# Patient Record
Sex: Male | Born: 1980 | Race: Black or African American | Hispanic: No | Marital: Single | Smoking: Current some day smoker
Health system: Southern US, Community
[De-identification: ages and names within clinical notes are randomized; demographics above are authoritative.]

## PROBLEM LIST (undated history)

## (undated) DIAGNOSIS — I639 Cerebral infarction, unspecified: Secondary | ICD-10-CM

## (undated) DIAGNOSIS — F419 Anxiety disorder, unspecified: Secondary | ICD-10-CM

## (undated) DIAGNOSIS — F329 Major depressive disorder, single episode, unspecified: Secondary | ICD-10-CM

## (undated) DIAGNOSIS — F259 Schizoaffective disorder, unspecified: Secondary | ICD-10-CM

## (undated) DIAGNOSIS — F32A Depression, unspecified: Secondary | ICD-10-CM

## (undated) HISTORY — DX: Anxiety disorder, unspecified: F41.9

## (undated) HISTORY — DX: Major depressive disorder, single episode, unspecified: F32.9

## (undated) HISTORY — DX: Depression, unspecified: F32.A

## (undated) HISTORY — DX: Cerebral infarction, unspecified: I63.9

---

## 2003-05-06 ENCOUNTER — Inpatient Hospital Stay (HOSPITAL_COMMUNITY): Admission: EM | Admit: 2003-05-06 | Discharge: 2003-05-12 | Payer: Self-pay | Admitting: Psychiatry

## 2004-10-03 ENCOUNTER — Inpatient Hospital Stay (HOSPITAL_COMMUNITY): Admission: EM | Admit: 2004-10-03 | Discharge: 2004-10-07 | Payer: Self-pay | Admitting: Psychiatry

## 2004-10-03 ENCOUNTER — Ambulatory Visit: Payer: Self-pay | Admitting: Psychiatry

## 2005-01-06 ENCOUNTER — Emergency Department (HOSPITAL_COMMUNITY): Admission: EM | Admit: 2005-01-06 | Discharge: 2005-01-06 | Payer: Self-pay | Admitting: Emergency Medicine

## 2005-03-15 ENCOUNTER — Emergency Department (HOSPITAL_COMMUNITY): Admission: EM | Admit: 2005-03-15 | Discharge: 2005-03-16 | Payer: Self-pay | Admitting: Emergency Medicine

## 2005-06-18 ENCOUNTER — Inpatient Hospital Stay (HOSPITAL_COMMUNITY): Admission: AD | Admit: 2005-06-18 | Discharge: 2005-06-26 | Payer: Self-pay | Admitting: Psychiatry

## 2005-06-19 ENCOUNTER — Ambulatory Visit: Payer: Self-pay | Admitting: Psychiatry

## 2007-01-15 ENCOUNTER — Emergency Department (HOSPITAL_COMMUNITY): Admission: EM | Admit: 2007-01-15 | Discharge: 2007-01-15 | Payer: Self-pay | Admitting: Family Medicine

## 2007-02-19 ENCOUNTER — Emergency Department (HOSPITAL_COMMUNITY): Admission: EM | Admit: 2007-02-19 | Discharge: 2007-02-19 | Payer: Self-pay | Admitting: Family Medicine

## 2007-06-14 ENCOUNTER — Emergency Department (HOSPITAL_COMMUNITY): Admission: EM | Admit: 2007-06-14 | Discharge: 2007-06-14 | Payer: Self-pay | Admitting: Emergency Medicine

## 2008-06-03 ENCOUNTER — Emergency Department (HOSPITAL_COMMUNITY): Admission: EM | Admit: 2008-06-03 | Discharge: 2008-06-03 | Payer: Self-pay | Admitting: Family Medicine

## 2008-12-06 ENCOUNTER — Emergency Department (HOSPITAL_COMMUNITY): Admission: EM | Admit: 2008-12-06 | Discharge: 2008-12-06 | Payer: Self-pay | Admitting: Emergency Medicine

## 2009-03-03 ENCOUNTER — Emergency Department (HOSPITAL_COMMUNITY): Admission: EM | Admit: 2009-03-03 | Discharge: 2009-03-03 | Payer: Self-pay | Admitting: Family Medicine

## 2010-08-30 ENCOUNTER — Emergency Department (HOSPITAL_COMMUNITY)
Admission: EM | Admit: 2010-08-30 | Discharge: 2010-08-30 | Payer: Self-pay | Source: Home / Self Care | Admitting: Family Medicine

## 2010-09-05 LAB — WOUND CULTURE: Culture: NO GROWTH

## 2010-11-27 LAB — GLUCOSE, CAPILLARY: Glucose-Capillary: 86 mg/dL (ref 70–99)

## 2011-01-06 NOTE — Discharge Summary (Signed)
NAME:  Steven Clayton, Steven Clayton NO.:  [id number]   MEDICAL RECORD NO.:  [id number]          PATIENT TYPE:  IPS   LOCATION:  0506                          FACILITY:  [facility]   PHYSICIAN:  Geoffery Lyons, M.D.      DATE OF BIRTH:  [birth year]   DATE OF ADMISSION:  10/03/2004  DATE OF DISCHARGE:  10/07/2004                                 DISCHARGE SUMMARY   CHIEF COMPLAINT/HISTORY OF PRESENT ILLNESS:  This is the second admission to  [facility] for this 30 year old single African-  American male voluntarily admitted with suicidal ideas with a plan to cut  his wrists.  He reports that his recent stressor was that his 34 year old  brother was shot in the head last year which he stated was gang related.  Endorsed leader support system, has a very supportive mother, sleeping only  about 6 hours at a time, decreased appetite, 18-pound weight loss, and knows  he has power to heal himself better.  He denies hallucinations, although he  has a history of hallucinations in the past.  He denies any alcohol or  substance abuse.   PAST PSYCHIATRIC HISTORY:  Admitted in September 2004 for auditory  hallucinations and paranoia.  He is seeing Dr. Allyne Gee in [facility]   ALCOHOL OR DRUG HISTORY:  He denies the use or abuse of any substance.  Initially he denied use or abuse of any substances, although reports some  recent marijuana use.   PAST MEDICAL HISTORY:  Noncontributory.   MEDICATIONS:  1.  Depakote 500 at night.  2.  Risperdal 4 mg at night.  He reports compliance.   PHYSICAL EXAMINATION:  Performed with no acute findings.   LABORATORY WORKUP:  CBC, hemoglobin 12.8, hematocrit 37.9.  Blood  chemistries showed glucose 108.  Liver profile was within normal limits.  Depakote level 12.5.  Drug screen was negative for substances of abuse.   MENTAL STATUS EXAM:  Reveals a polite male, cooperative, good eye contact.  Casually dressed.   Speech was clear, normal pace, rate and tone.  Mood was  depressed.  Affect was constricted.  Thought process reports some manical  thinking.  Endorses suicidal thoughts but denied any at the time of this  evaluation, no homicidal idea.  He denies any auditory or visual  hallucinations.  Cognition well preserved.   DISCHARGE DIAGNOSES:   AXIS I:  Schizo-affective disorder.   AXIS II:  No diagnosis.   AXIS III:  No diagnosis.   AXIS IV:  Moderate.   AXIS V:  Upon admission 30, highest in last year 60-65.   COURSE IN HOSPITAL:  He was admitted, started in individual and group  psychotherapy.  He was maintained on Risperdal 4 mg at night and Depakote ER  500 at night.  He was also given Ambien for sleep.  __________ 5 mg per day.  He initially was very guarded as far as what was going on.  He did say that  he was wanting to mend a relationship, and it did not work out.  He felt  very overwhelmed.  He was very upset and said he did not want to discuss it.  He was very guarded about this information, but he denied any active  suicidal or homicidal ideation.  Basically he contained to the room, he  would come out and share some with the other patients and staff, endorsing  that he was better but the February 15, no suicide or homicidal ideas.  Endorsed that he had been able to live with what happened.  If he was able  to deal with that happened it was going to make him feel better.  He  endorsed that he was not ready to do it.  He continued to be compliant with  medications, endorsed he was feeling better.  Endorsed no suicidal or  homicidal ideas, endorsed side effects to medication, so he was stable  enough that we went ahead and discharged to outpatient followup.   DISCHARGE DIAGNOSES:   AXIS I:  Schizo-affective disorder, depressed.   AXIS II:  No diagnosis.   AXIS III:  No diagnosis.   AXIS IV:  Moderate.   AXIS V:  On discharge 55.   DISCHARGE MEDICATIONS:  1.   Risperdal 2 mg 2 at night.  2.  Depakote ER 500 one at night.  3.  Lexapro 10 mg per day.   FOLLOW UP:  [facility].      IL/MEDQ  D:  11/08/2004  T:  11/08/2004  Job:  [id number]

## 2011-01-06 NOTE — Discharge Summary (Signed)
NAME:  Steven Clayton, Steven Clayton                      ACCOUNT NO.:  [id number]   MEDICAL RECORD NO.:  [id number]                   PATIENT TYPE:  IPS   LOCATION:  0406                                 FACILITY:  [facility]   PHYSICIAN:  Geoffery Lyons, M.D.                   DATE OF BIRTH:  06-25-1981   DATE OF ADMISSION:  05/06/2003  DATE OF DISCHARGE:  05/12/2003                                 DISCHARGE SUMMARY   CHIEF COMPLAINT AND PRESENT ILLNESS:  This was the first admission to [facility] for this 30 year old single African-American male  involuntarily committed.  Apparently, he was hostile, aggressive, stating he  was going to kill his mother, having auditory hallucinations, hearing  spirits.  The patient reported that he did hear voices and he felt that his  mother was trying to get him hurt.  There was apparently information that he  said that he was telepathic.  He denied any visual hallucinations.  York Spaniel he  was upset because the staff was asking too many questions about his personal  life.  Did not like the medication he was taking, especially the Depakote,  because the pill was too big.   PAST PSYCHIATRIC HISTORY:  First time at [facility].  Was  hospitalized at [facility].  Followed with the [facility]  Team.   ALCOHOL/DRUG HISTORY:  Smokes.  Denies any other substance use, although  drug screen was positive for marijuana.   PAST MEDICAL HISTORY:  Noncontributory.   MEDICATIONS:  Abilify 15 mg at bedtime, Depakote ER 500 mg at night,  Risperdal 4 mg at night but there is probably lack of compliance.   PHYSICAL EXAMINATION:  Performed and failed to show any acute findings.   MENTAL STATUS EXAM:  Alert, young male.  Cooperative, casually dressed.  Good eye contact.  Clear speech.  Claims he feels good.  Appears flat.  Thought processes are positive for delusions related to patient saying that  he is telepathic.  Positive homicidal thoughts  towards mother, positive  auditory hallucinations and some paranoia.  The patient is guarded.  Cognition well-preserved.   ADMISSION DIAGNOSES:   AXIS I:  Schizophrenia, paranoid-type.   AXIS II:  No diagnosis.   AXIS III:  No diagnosis.   AXIS IV:  Moderate.   AXIS V:  Global Assessment of Functioning upon admission 25; highest Global  Assessment of Functioning in the last year 60.   LABORATORY DATA:  CBC was within normal limits.  Blood chemistry within  normal limits.  Hemoglobin A1C was 5.6.  Triglycerides 118.  Cholesterol LDL  was 122.  Thyroid profile was within normal limits.  Drug screen was  positive for marijuana.   HOSPITAL COURSE:  He was admitted and started intensive individual and group  psychotherapy.  He was initially given some Ativan as needed as well as  Cogentin.  He was  placed back on the Depakote ER 500 mg at night and  Risperdal 0.5 mg in the morning and 1 mg at night.  Risperdal was increased  to 0.5 mg twice a day and 1.5 mg at night.  Initially, he endorsed that he  was schizophrenic, living with his mother and said he did not have any other  options.  Claimed that the mother gets his check.  He would rather go  somewhere else.  Very guarded about his symptoms.  Not very forthcoming.  Still focused on wanting to get out of the house but understanding that he  might not have any other options.  There was an attempt to have a family  session with his mother but she could not come.  He was compliant with the  medication and did endorse he was feeling better.  He was denying any  hallucinations.  There was no evidence of any paranoia.  On May 12, 2003, he was much improved.  No active symptoms  Willing and motivated to  pursue further outpatient treatment.  Was going to continue to follow-up  with PAC in [facility].  Will be okay to go with the mother and  eventually he felt he could make it with the help of the [facility] staff to find a  place for  himself.  Upon discharge, in full contact with reality.  No  suicidal or homicidal ideation.   DISCHARGE DIAGNOSES:   AXIS I:  Schizophrenia, paranoid-type.   AXIS II:  No diagnosis.   AXIS III:  No diagnosis.   AXIS IV:  Moderate.   AXIS V:  Global Assessment of Functioning upon discharge 50.   DISCHARGE MEDICATIONS:  1. Depakote 500 mg at night.  2. Risperdal 1 mg, 1/2 twice a day and 1-1/2 at bedtime.   FOLLOW UP:  [facility].                                               Geoffery Lyons, M.D.    IL/MEDQ  D:  06/10/2003  T:  06/11/2003  Job:  [id number]

## 2011-01-06 NOTE — H&P (Signed)
NAME:  Steven Clayton, Steven Clayton                      ACCOUNT NO.:  [id number]   MEDICAL RECORD NO.:  [id number]                   PATIENT TYPE:  IPS   LOCATION:  0406                                 FACILITY:  [facility]   PHYSICIAN:  Geoffery Lyons, M.D.                   DATE OF BIRTH:  Jan 10, 1981   DATE OF ADMISSION:  05/06/2003  DATE OF DISCHARGE:                         PSYCHIATRIC ADMISSION ASSESSMENT   IDENTIFYING INFORMATION:  This is a 30 year old single African-American male  involuntarily committed on May 06, 2003.   HISTORY OF PRESENT ILLNESS:  The patient presents with a history of  commitment.  Commitment papers state that patient was hostile, aggressive,  going to kill his mother, having auditory hallucinations, hearing spirits.  The patient reports that he did hear voices yesterday and thinks that his  mother is trying to get him hurt.  There is also on chart that patient  reports he was telepathic.  The patient denies any visual hallucinations.  He states that he is upset this time because we are asking too many  questions about his personal life.  He states he does not like taking his  medications, especially his Depakote because the pill is too big.   PAST PSYCHIATRIC HISTORY:  First admission to [facility].  Was  hospitalized at [facility] in June of [facility].  Is an outpatient at [facility].  He is a member of the [facility] Team.   SOCIAL HISTORY:  This is a 30 year old single African-American male.  He  lives with his mother.  He is in his first semester at [employer].   FAMILY HISTORY:  Unknown.   ALCOHOL/DRUG HISTORY:  The patient smokes.  Denies any alcohol or drug use.  Urine drug screen was positive for marijuana.   PRIMARY CARE PHYSICIAN:  Unknown.   MEDICAL PROBLEMS:  None.   MEDICATIONS:  Has been on Abilify 15 mg q.h.s., Depakote ER 500 mg at  bedtime and Risperdal 4 mg q.h.s.  Some indication of noncompliance.   ALLERGIES:  No  known allergies.   REVIEW OF SYSTEMS:  Negative for cardiac, pulmonary, neurological problems.  No GI or GU.  No visual problems or musculoskeletal.  History of occasional  headaches.   PHYSICAL EXAMINATION:  VITAL SIGNS:  Temperature 98.3, heart rate 81,  respirations 16, blood pressure 111/70.  He is 196 pounds.  He is 5 feet 11  inches tall.  GENERAL:  This a well-nourished, well-developed, 30 year old male in no  acute distress.  HEAD:  Normocephalic and atraumatic.  EENT:  No sinus tenderness.  No nasal discharge.  Sticks out his tongue.  NECK:  Negative lymphadenopathy.  CHEST:  Clear to auscultation.  HEART:  Regular rate and rhythm.  ABDOMEN:  Soft, nontender abdomen.  No CVA tenderness.  MUSCULOSKELETAL:  Muscle strength and tone is equal bilaterally.  Strong  bilateral hand grasp.  NEURO:  EOMs are  intact.  Nonfocal neurological findings.   LABORATORY DATA:  CBC is within normal limits.  CMET within normal limits.  Hemoglobin A1C is 5.6.  Urine drug screen was positive for marijuana.  TSH  is 1.941.  Valproic acid level is 22.4.   MENTAL STATUS EXAM:  Alert, young male.  Cooperative.  Casually dressed.  Good eye contact.  Speech is clear.  Mood, patient states he feels good.  He  appears flat.  Thought processes are positive for delusions related to  patient feeling that he is telepathic.  Positive homicidal thoughts towards  mother.  Positive auditory hallucinations, although denying any currently,  and some positive paranoid ideation.  The patient is guarded.  Cognitive  function intact.  Memory is fair.  Judgment and insight is poor.  He is a  poor historian.   DIAGNOSES:   AXIS I:  1. Psychosis not otherwise specified.  2. Rule out schizophrenia.   AXIS II:  Deferred.   AXIS III:  None.   AXIS IV:  Problems with primary support group, other psychosocial problems  related to mental illness, noncompliance.   AXIS V:  Current 25; this past year estimated  65.   PLAN:  Involuntary commitment for psychotic symptoms, homicidal thoughts.  Stabilize mood and thinking so patient can be safe.  The patient to be  placed on the [address] Hall initially for close monitoring.  Will have Ativan  available for agitation.  Will resume Depakote and Risperdal and check  Depakote level for therapeutic range.  We will have a family session with  mother prior to discharge.  The patient is to remain medication compliant,  to continue to follow up at the [facility].   TENTATIVE LENGTH OF STAY:  Four to five days.     Landry Corporal, N.P.                       Geoffery Lyons, M.D.    JO/MEDQ  D:  05/08/2003  T:  05/10/2003  Job:  [id number]

## 2011-01-06 NOTE — Discharge Summary (Signed)
NAME:  ADLER, CHARTRAND NO.:  [id number]   MEDICAL RECORD NO.:  [id number]          PATIENT TYPE:  IPS   LOCATION:  0406                          FACILITY:  [facility]   PHYSICIAN:  Jeanice Lim, M.D. DATE OF BIRTH:  1981-03-01   DATE OF ADMISSION:  06/18/2005  DATE OF DISCHARGE:  06/26/2005                                 DISCHARGE SUMMARY   IDENTIFYING DATA:  This is a 30 year old single African-American male  involuntarily committed, presenting with a history on petition papers of  being disorganized with voices of women screaming before, at least  psychotic, had lost a brother age 46 three years ago and hears his voice.  He also lives with mother and has been symptomatic, worsening for the last  couple of weeks.  He denied substance use.  He reported suicidal and  homicidal ideation prior to admission.   PAST PSYCHIATRIC HISTORY:  Second hospitalization [facility].  No previous suicide  attempts.  Followed by Dr. Mila Homer at [facility] and  [facility] 4-5 years ago.  History of [facility] hospitalization and  state psychiatric hospitalization 4-5 years ago.   ALCOHOL AND DRUG HISTORY:  Smokes, no alcohol or substance abuse noted.   MEDICAL PROBLEMS:  None.   MEDICATIONS:  Risperdal, Depakote, Risperdal Consta.   ALLERGIES:  No known drug allergies.   PHYSICAL EXAMINATION:  Physical and neurologic exam within normal limits.   MENTAL STATUS EXAM:  Alert, cooperative, pleasant, good eye contact.  Speech  clear.  Somewhat guarded.  Paranoid, reporting hallucinations and some mood  instability, dysphoric.  Isolating in room.  Cognition was intact.  Judgment  and insight were impaired.   ADMISSION DIAGNOSIS:  AXIS I:  Paranoid schizophrenia.  AXIS II:  Deferred.  AXIS III:  None.  AXIS IV:  Moderate stressors with limited support system.  AXIS V:  30/55.   HOSPITAL COURSE:  Patient was admitted, ordered routine p.r.n. medications,  underwent further monitoring, was encouraged to participate in individual,  group and milieu therapy.  Patient was gradually stabilized on medications  and was monitored closely as he was given enough time to show some  improvement and decrease in paranoia which was causing acute distress.  Patient was discharged in improved condition showing initial response to  medications, tolerating them well without side effects.   DISCHARGE MEDICATIONS:  He was given medication education and discharged on:  1.  Trazodone 100 mg at 8:00 p.m.  2.  Seroquel 100 mg q.h.s. p.r.n. insomnia.  3.  Cogentin 1 mg t.i.d.  4.  Risperdal 2 mg 2-1/2 at 8:00 p.m.  5.  Depakote 250 mg 3 at 8:00 p.m.  6.  Ambien 10 mg q.h.s. p.r.n.  7.  Risperdal Consta 25 mg/2 mL IM every 3 weeks, next due July 12, 2005.   FOLLOW UP:  [facility] Team on June 28, 2005 at 11:00 a.m.   DISCHARGE DIAGNOSIS:  AXIS I:  Paranoid schizophrenia.  AXIS II:  Deferred.  AXIS III:  None.  AXIS IV:  Global assessment of function on discharge 50-55.  Jeanice Lim, M.D.  Electronically Signed     JEM/MEDQ  D:  07/01/2005  T:  07/02/2005  Job:  [id number]

## 2011-01-06 NOTE — H&P (Signed)
NAME:  Steven Clayton, MILNES NO.:  [id number]   MEDICAL RECORD NO.:  [id number]          PATIENT TYPE:  IPS   LOCATION:  0506                          FACILITY:  [facility]   PHYSICIAN:  Geoffery Lyons, M.D.      DATE OF BIRTH:  Dec 31, 1980   DATE OF ADMISSION:  10/02/2004  DATE OF DISCHARGE:                         PSYCHIATRIC ADMISSION ASSESSMENT   IDENTIFYING INFORMATION:  This is a 30 year old single African-American male  voluntarily admitted on October 02, 2004.   HISTORY OF PRESENT ILLNESS:  The patient presents with a history of suicidal  ideation with a plan to cut his wrist.  The patient reports that his recent  stressor is that his 2 year old brother was shot in the head last year,  which he states is gang-related.  He states that he has little support  group.  He does have a very supportive mother, however.  He is sleeping only  about six hours at a time.  Appetite has been decreased with an 18-pound  weight loss.  He states that he has power to get himself better.  He denies  any hallucinations, although he does have a history of auditory  hallucinations in the past.  The patient denies any alcohol use but smokes  marijuana on occasion.   PAST PSYCHIATRIC HISTORY:  Second admission.  Was here in September of 2004  for auditory hallucinations and paranoid ideation.  Is an outpatient at  [facility].  Sees Dr. Allyne Gee.  Has a history of a  suicide attempt with self-inflicted injuries and reports increasing his  exercise as a means of self-harm.   SOCIAL HISTORY:  This is a 30 year old single African-American male.  He  lives with his mother.  He has two other siblings.  He is on disability.   FAMILY HISTORY:  None.   ALCOHOL/DRUG HISTORY:  The patient smokes.  Denies any alcohol use.  Reports  recent marijuana use.   PRIMARY CARE PHYSICIAN:  Unknown.   MEDICAL PROBLEMS:  None.   MEDICATIONS:  Depakote 500 mg at bedtime, Risperdal 4 mg at  bedtime.  The  patient reports compliance.   ALLERGIES:  No known allergies.   REVIEW OF SYSTEMS:  Significant for weight loss, problems with insomnia.  The patient denies any chest pain or shortness of breath.  The patient does  smoke and has some recent recreational drug use.   PHYSICAL EXAMINATION:  VITAL SIGNS:  Temperature 98.5, pulse rate 65,  respiratory rate 20, blood pressure 132/81, weight 260 pounds.  GENERAL:  This is a well-developed male with some effeminate traits.  NECK:  Negative lymphadenopathy.  CHEST:  Clear.  HEART:  Regular rate and rhythm.  ABDOMEN:  Soft, nontender.  EXTREMITIES:  Moves all extremities.  No clubbing or deformities.  5+  against resistance.  SKIN:  Warm and dry with no rashes or abrasions noted.   LABORATORY DATA:  Albumin 3.4.  Urine drug screen is pending.   MENTAL STATUS EXAM:  Alert, polite, effeminate male.  Cooperative with good  eye contact.  Casually dressed.  Speech is clear.  Normal pace and  tone.  Mood is depressed.  Affect is somewhat constricted.  Thought processes with  patient reporting some magical thinking.  Endorsing suicidal thoughts but  denied any currently.  No homicidal ideation or auditory or visual  hallucinations.  Cognitive function intact.  Memory is fair.  Judgment is  fair.  Insight is fair.   DIAGNOSES:   AXIS I:  Schizoaffective disorder.   AXIS II:  Deferred.   AXIS III:  None.   AXIS IV:  Other psychosocial problems related to grief and lack of support.   AXIS V:  Current 30; estimated this past year 60-65.   PLAN:  Stabilize mood and thinking.  Will resume his medications.  Will  check drug screen.  We will discuss drug use as related to symptoms.  We  will add antidepressant.  Risks and benefits were discussed.  The patient is  agreeable.  Will consider a family session with patient's support group.  The patient is to follow-up with mental health.      JO/MEDQ  D:  10/04/2004  T:  10/04/2004   Job:  [id number]

## 2011-05-22 LAB — POCT RAPID STREP A: Streptococcus, Group A Screen (Direct): NEGATIVE

## 2013-11-04 ENCOUNTER — Encounter (HOSPITAL_COMMUNITY): Payer: Self-pay | Admitting: Emergency Medicine

## 2013-11-04 ENCOUNTER — Emergency Department (HOSPITAL_COMMUNITY)
Admission: EM | Admit: 2013-11-04 | Discharge: 2013-11-04 | Disposition: A | Discharge status: Home / Self Care | Payer: Medicare Other | Attending: Emergency Medicine | Admitting: Emergency Medicine

## 2013-11-04 DIAGNOSIS — F17200 Nicotine dependence, unspecified, uncomplicated: Secondary | ICD-10-CM | POA: Diagnosis not present

## 2013-11-04 DIAGNOSIS — Z79899 Other long term (current) drug therapy: Secondary | ICD-10-CM | POA: Diagnosis not present

## 2013-11-04 DIAGNOSIS — Z008 Encounter for other general examination: Secondary | ICD-10-CM | POA: Diagnosis present

## 2013-11-04 DIAGNOSIS — F29 Unspecified psychosis not due to a substance or known physiological condition: Secondary | ICD-10-CM | POA: Diagnosis not present

## 2013-11-04 DIAGNOSIS — F259 Schizoaffective disorder, unspecified: Secondary | ICD-10-CM | POA: Diagnosis not present

## 2013-11-04 HISTORY — DX: Schizoaffective disorder, unspecified: F25.9

## 2013-11-04 LAB — COMPREHENSIVE METABOLIC PANEL
ALT: 22 U/L (ref 0–53)
AST: 22 U/L (ref 0–37)
Albumin: 4 g/dL (ref 3.5–5.2)
Alkaline Phosphatase: 100 U/L (ref 39–117)
BUN: 13 mg/dL (ref 6–23)
CO2: 22 mEq/L (ref 19–32)
Calcium: 9.6 mg/dL (ref 8.4–10.5)
Chloride: 100 mEq/L (ref 96–112)
Creatinine, Ser: 1.14 mg/dL (ref 0.50–1.35)
GFR calc Af Amer: >90 mL/min (ref >90)
GFR calc non Af Amer: 84 mL/min — ABNORMAL LOW (ref >90)
Glucose, Bld: 100 mg/dL — ABNORMAL HIGH (ref 70–99)
Potassium: 3.8 mEq/L (ref 3.7–5.3)
Sodium: 138 mEq/L (ref 137–147)
Total Bilirubin: <0.2 mg/dL — ABNORMAL LOW (ref 0.3–1.2)
Total Protein: 8.1 g/dL (ref 6.0–8.3)

## 2013-11-04 LAB — VALPROIC ACID LEVEL: Valproic Acid Lvl: 39.3 ug/mL — ABNORMAL LOW (ref 50.0–100.0)

## 2013-11-04 LAB — CBC
HCT: 35.6 % — ABNORMAL LOW (ref 39.0–52.0)
Hemoglobin: 12.4 g/dL — ABNORMAL LOW (ref 13.0–17.0)
MCH: 29.7 pg (ref 26.0–34.0)
MCHC: 34.8 g/dL (ref 30.0–36.0)
MCV: 85.4 fL (ref 78.0–100.0)
Platelets: 258 10*3/uL (ref 150–400)
RBC: 4.17 MIL/uL — ABNORMAL LOW (ref 4.22–5.81)
RDW: 13.7 % (ref 11.5–15.5)
WBC: 9 10*3/uL (ref 4.0–10.5)

## 2013-11-04 LAB — ETHANOL: Alcohol, Ethyl (B): <11 mg/dL (ref 0–11)

## 2013-11-04 LAB — RAPID URINE DRUG SCREEN, HOSP PERFORMED
Amphetamines: NOT DETECTED
Barbiturates: NOT DETECTED
Benzodiazepines: NOT DETECTED
Cocaine: NOT DETECTED
Opiates: NOT DETECTED
Tetrahydrocannabinol: NOT DETECTED

## 2013-11-04 MED ORDER — BENZTROPINE MESYLATE 1 MG PO TABS: 1.0000 mg | ORAL_TABLET | Freq: Every day | ORAL | Status: DC

## 2013-11-04 MED ORDER — DIVALPROEX SODIUM ER 500 MG PO TB24
500.0000 mg | ORAL_TABLET | Freq: Every day | ORAL | Status: DC
  Administered 2013-11-04: 500 mg via ORAL
  Filled 2013-11-04: qty 1

## 2013-11-04 MED ORDER — BENZTROPINE MESYLATE 1 MG PO TABS
1.0000 mg | ORAL_TABLET | Freq: Every day | ORAL | Status: DC
  Administered 2013-11-04: 1 mg via ORAL
  Filled 2013-11-04: qty 1

## 2013-11-04 MED ORDER — LISINOPRIL 20 MG PO TABS
20.0000 mg | ORAL_TABLET | Freq: Every day | ORAL | Status: DC
  Administered 2013-11-04: 20 mg via ORAL
  Filled 2013-11-04: qty 1

## 2013-11-04 MED ORDER — DIVALPROEX SODIUM ER 500 MG PO TB24: 500.0000 mg | ORAL_TABLET | Freq: Every day | ORAL | Status: DC

## 2013-11-04 MED ORDER — HYDROCHLOROTHIAZIDE 12.5 MG PO CAPS
12.5000 mg | ORAL_CAPSULE | Freq: Every day | ORAL | Status: DC
  Administered 2013-11-04: 12.5 mg via ORAL
  Filled 2013-11-04: qty 1

## 2013-11-04 MED ORDER — RISPERIDONE MICROSPHERES 37.5 MG IM SUSR
37.5000 mg | INTRAMUSCULAR | Status: DC
  Administered 2013-11-04: 37.5 mg via INTRAMUSCULAR
  Filled 2013-11-04: qty 2

## 2013-11-04 MED ORDER — LISINOPRIL-HYDROCHLOROTHIAZIDE 20-12.5 MG PO TABS: 1.0000 | ORAL_TABLET | Freq: Every day | ORAL | Status: DC

## 2013-11-04 NOTE — Discharge Instructions (Signed)
Schizoaffective Disorder  Schizoaffective disorder (ScAD) is a mental illness. It causes symptoms that are a mixture of schizophrenia (a psychotic disorder) and an affective (mood) disorder. The schizophrenic symptoms may include delusions, hallucinations, or odd behavior. The mood symptoms may be similar to major depression or bipolar disorder. ScAD may interfere with personal relationships or normal daily activities. People with ScAD are at increased risk for job loss, social isolation, physical health problems, anxiety and substance use disorders, and suicide.  ScAD usually occurs in cycles. Periods of severe symptoms are followed by periods of  less severe symptoms or improvement. The illness affects men and women equally but usually appears at an earlier age (teenage or early adult years) in men. People who have family members with schizophrenia, bipolar disorder, or ScAD are at higher risk of developing ScAD.  SYMPTOMS   At any one time, people with ScAD may have psychotic symptoms only or both psychotic and mood symptoms. The psychotic symptoms include one or more of the following:  · Hearing, seeing, or feeling things that are not there (hallucinations).    · Having fixed, false beliefs (delusions). The delusions usually are of being attacked, harassed, cheated, persecuted, or conspired against (paranoid delusions).  · Speaking in a way that makes no sense to others (disorganized speech).  The psychotic symptoms of ScAD may also include confusing or odd behavior or any of the negative symptoms of schizophrenia. These include loss of motivation for normal daily activities, such as bathing or grooming, withdrawal from other people, and lack of emotions.     The mood symptoms of ScAD occur more often than not. They resemble major depressive disorder or bipolar mania. Symptoms of major depression include depressed mood and four or more of the following:  · Loss of interest in usually pleasurable activities  (anhedonia).  · Sleeping more or less than normal.  · Feeling worthless or excessively guilty.  · Lack of energy or motivation.  · Trouble concentrating.  · Eating more or less than usual.  · Thinking a lot about death or suicide.  Symptoms of bipolar mania include abnormally elevated or irritable mood and increased energy or activity, plus three or more of the following:    · More confidence than normal or feeling that you are able to do anything (grandiosity).  · Feeling rested with less sleep than normal.    · Being easily distracted.    · Talking more than usual or feeling pressured to keep talking.    · Feeling that your thoughts are racing.  · Engaging in high-risk activities such as buying sprees or foolish business decisions.  DIAGNOSIS   ScAD is diagnosed through an assessment by your health care provider. Your health care provider will observe and ask questions about your thoughts, behavior, mood, and ability to function in daily life. Your health care provider may also ask questions about your medical history and use of drugs, including prescription medicines. Your health care provider may also order blood tests and imaging exams. Certain medical conditions and substances can cause symptoms that resemble ScAD. Your health care provider may refer you to a mental health specialist for evaluation.   ScAD is divided into two types. The depressive type is diagnosed if your mood symptoms are limited to major depression. The bipolar type is diagnosed if your mood symptoms are manic or a mixture of manic and depressive symptoms  TREATMENT   ScAD is usually a life-long illness. Long-term treatment is necessary. The following treatments are available:  · Medicine Different types of   medicine are used to treat ScAD. The exact combination depends on the type and severity of your symptoms. Antipsychotic medicine is used to control psychotic symptoms such as delusions, paranoia, and hallucinations. Mood stabilizers can  even the highs and lows of bipolar manic mood swings. Antidepressant medicines are used to treat major depressive symptoms.  · Counseling or talk therapy Individual, group, or family counseling may be helpful in providing education, support, and guidance. Many people with ScAD also benefit from social skills and job skills (vocational) training.  A combination of medicine and counseling is usually best for managing the disorder over time. A procedure in which electricity is applied to the brain through the scalp (electroconvulsive therapy) may be used to treat people with severe manic symptoms that do not respond to medicine and counseling.  HOME CARE INSTRUCTIONS   · Take all your medicine as prescribed.  · Check with your health care provider before starting new prescription or over-the-counter medicines.  · Keep all follow up appointments with your health care provider.  SEEK MEDICAL CARE IF:   · If you are not able to take your medicines as prescribed.  · If your symptoms get worse.  SEEK IMMEDIATE MEDICAL CARE IF:   · You have serious thoughts about hurting yourself or others.  Document Released: 12/18/2006 Document Revised: 05/28/2013 Document Reviewed: 03/21/2013  Exit[employer]® Patient Information ©2014 [employer].

## 2013-11-04 NOTE — ED Notes (Signed)
MD at bedside at this time.

## 2013-11-04 NOTE — Consult Note (Signed)
[facility] Face-to-Face Psychiatry Consult   Reason for Consult:  Visual disturbance Referring Physician:  EDP  Steven Clayton is an 33 y.o. male. Total Time spent with patient: 45 minutes  Assessment: AXIS I:  Schizoaffective Disorder AXIS II:  Deferred AXIS III:   Past Medical History  Diagnosis Date  . Schizoaffective disorder    AXIS IV:  other psychosocial or environmental problems AXIS V:  61-70 mild symptoms  Plan:  No evidence of imminent risk to self or others at present.   Patient does not meet criteria for psychiatric inpatient admission. Supportive therapy provided about ongoing stressors. Discussed crisis plan, support from social network, calling 911, coming to the Emergency Department, and calling Suicide Hotline.  Subjective:   Steven Clayton is a 33 y.o. male patient.  HPI:  Patient states that he came to the hospital cause he was having problems with his vision.  "I'm having problems with my vision; I'm not seeing normal; I can't explain it; I'm seeing people but like they are going to disappear."  Asked patient if it happen when looking at a person that was close to him or further away.  Patient states that it is with people that are far away.  Patient denies suicidal/homicidal ideation, auditory/visual hallucinations, and paranoia.  Patient states that he goes to [facility] for outpatient services.  Patient states "Since all that stuff happened with [facility] I haven't had the transportation to get to [place] and I missed my Risperdal Consta injection and I am running out of my medications."  Patient also states "My whole family wears glasses and I might need them to."  HPI Elements:   Location:  Visual disturbance. Quality:  "can't see; people look like they are disappearing"  . Severity:  "can't see; people look like they are disappearing"  . Timing:  "long time".  Past Psychiatric History: Past Medical History  Diagnosis Date  . Schizoaffective disorder     reports  that he has been smoking.  He has never used smokeless tobacco. He reports that he drinks about 0.6 ounces of alcohol per week. He reports that he does not use illicit drugs. History reviewed. No pertinent family history.         Allergies:  No Known Allergies  ACT Assessment Complete:  No:   Past Psychiatric History: Diagnosis:  Schizoaffective disorder  Hospitalizations:  Yes  Outpatient Care:  [facility]  Substance Abuse Care:  Denies  Self-Mutilation:  Denies  Suicidal Attempts:  Denies  Homicidal Behaviors:  Denies   Violent Behaviors:  Denies   Place of Residence:  [place] Marital Status:  Single Employed/Unemployed:  Unemployed Education:   Family Supports:  Yes Objective: Blood pressure 128/78, pulse 61, temperature 98.2 F (36.8 C), temperature source Oral, resp. rate 19, SpO2 96.00%.There is no height or weight on file to calculate BMI. Results for orders placed during the hospital encounter of 11/04/13 (from the past 72 hour(s))  CBC     Status: Abnormal   Collection Time    11/04/13  2:32 AM      Result Value Ref Range   WBC 9.0  4.0 - 10.5 K/uL   RBC 4.17 (*) 4.22 - 5.81 MIL/uL   Hemoglobin 12.4 (*) 13.0 - 17.0 g/dL   HCT 35.6 (*) 39.0 - 52.0 %   MCV 85.4  78.0 - 100.0 fL   MCH 29.7  26.0 - 34.0 pg   MCHC 34.8  30.0 - 36.0 g/dL   RDW 13.7  11.5 -  15.5 %   Platelets 258  150 - 400 K/uL  COMPREHENSIVE METABOLIC PANEL     Status: Abnormal   Collection Time    11/04/13  2:32 AM      Result Value Ref Range   Sodium 138  137 - 147 mEq/L   Potassium 3.8  3.7 - 5.3 mEq/L   Chloride 100  96 - 112 mEq/L   CO2 22  19 - 32 mEq/L   Glucose, Bld 100 (*) 70 - 99 mg/dL   BUN 13  6 - 23 mg/dL   Creatinine, Ser 1.14  0.50 - 1.35 mg/dL   Calcium 9.6  8.4 - 10.5 mg/dL   Total Protein 8.1  6.0 - 8.3 g/dL   Albumin 4.0  3.5 - 5.2 g/dL   AST 22  0 - 37 U/L   ALT 22  0 - 53 U/L   Alkaline Phosphatase 100  39 - 117 U/L   Total Bilirubin <0.2 (*) 0.3 - 1.2 mg/dL   GFR  calc non Af Amer 84 (*) >90 mL/min   GFR calc Af Amer >90  >90 mL/min   Comment: (NOTE)     The eGFR has been calculated using the CKD EPI equation.     This calculation has not been validated in all clinical situations.     eGFR's persistently <90 mL/min signify possible Chronic Kidney     Disease.  ETHANOL     Status: None   Collection Time    11/04/13  2:32 AM      Result Value Ref Range   Alcohol, Ethyl (B) <11  0 - 11 mg/dL   Comment:            LOWEST DETECTABLE LIMIT FOR     SERUM ALCOHOL IS 11 mg/dL     FOR MEDICAL PURPOSES ONLY  URINE RAPID DRUG SCREEN (HOSP PERFORMED)     Status: None   Collection Time    11/04/13  4:50 AM      Result Value Ref Range   Opiates NONE DETECTED  NONE DETECTED   Cocaine NONE DETECTED  NONE DETECTED   Benzodiazepines NONE DETECTED  NONE DETECTED   Amphetamines NONE DETECTED  NONE DETECTED   Tetrahydrocannabinol NONE DETECTED  NONE DETECTED   Barbiturates NONE DETECTED  NONE DETECTED   Comment:            DRUG SCREEN FOR MEDICAL PURPOSES     ONLY.  IF CONFIRMATION IS NEEDED     FOR ANY PURPOSE, NOTIFY LAB     WITHIN 5 DAYS.                LOWEST DETECTABLE LIMITS     FOR URINE DRUG SCREEN     Drug Class       Cutoff (ng/mL)     Amphetamine      1000     Barbiturate      200     Benzodiazepine   893     Tricyclics       734     Opiates          300     Cocaine          300     THC              50   Labs are reviewed and are pertinent for UDS negative and ETOH <11/.  Current Facility-Administered Medications  Medication Dose Route Frequency Provider Last Rate  Last Dose  . benztropine (COGENTIN) tablet 1 mg  1 mg Oral Daily Shuvon Rankin, NP      . divalproex (DEPAKOTE ER) 24 hr tablet 500 mg  500 mg Oral Daily Shuvon Rankin, NP      . lisinopril (PRINIVIL,ZESTRIL) tablet 20 mg  20 mg Oral Daily Shuvon Rankin, NP       And  . hydrochlorothiazide (MICROZIDE) capsule 12.5 mg  12.5 mg Oral Daily Shuvon Rankin, NP      . risperiDONE  microspheres (RISPERDAL CONSTA) injection 37.5 mg  37.5 mg Intramuscular Q14 Days Shuvon Rankin, NP       Current Outpatient Prescriptions  Medication Sig Dispense Refill  . benztropine (COGENTIN) 1 MG tablet Take 1 mg by mouth daily.      . divalproex (DEPAKOTE ER) 500 MG 24 hr tablet Take 500 mg by mouth daily.      Marland Kitchen lisinopril-hydrochlorothiazide (PRINZIDE,ZESTORETIC) 20-12.5 MG per tablet Take 1 tablet by mouth daily.      . risperiDONE microspheres (RISPERDAL CONSTA) 37.5 MG injection Inject 37.5 mg into the muscle every 14 (fourteen) days.        Psychiatric Specialty Exam:     Blood pressure 128/78, pulse 61, temperature 98.2 F (36.8 C), temperature source Oral, resp. rate 19, SpO2 96.00%.There is no height or weight on file to calculate BMI.  General Appearance: Casual  Eye Contact::  Good  Speech:  Clear and Coherent and Normal Rate  Volume:  Normal  Mood:  Anxious  Affect:  Congruent  Thought Process:  Circumstantial  Orientation:  Full (Time, Place, and Person)  Thought Content:  Rumination  Suicidal Thoughts:  No  Homicidal Thoughts:  No  Memory:  Immediate;   Good Recent;   Good  Judgement:  Fair  Insight:  Fair  Psychomotor Activity:  Normal  Concentration:  Fair  Recall:  Cherry Log of Knowledge:Fair  Language: Good  Akathisia:  No  Handed:  Right  AIMS (if indicated):     Assets:  Communication Skills Desire for Improvement Housing  Sleep:      Musculoskeletal: Strength & Muscle Tone: within normal limits Gait & Station: normal Patient leans: N/A  Treatment Plan Summary: Follow up with [facility] Visual acuity ordered  Disposition:  Give patient Risperdal Consta 37.5 mg injection and prescription for psychotropic medications.  Patient can be discharge home to follow up with [facility].  Also give referral to Optometrist.    Discharge Assessment     Demographic Factors:  Male  Total Time spent with patient: 15 minutes  Psychiatric Specialty  Exam: Same as above  Musculoskeletal: Same as above  Mental Status Per Nursing Assessment::   On Admission:     Current Mental Status by Physician: Patient denies suicidal/homicidal ideation, psychosis, and paranoia  Loss Factors: NA  Historical Factors: NA  Risk Reduction Factors:   Living with another person, especially a relative and Positive therapeutic relationship  Continued Clinical Symptoms:  Previous Psychiatric Diagnoses and Treatments  Cognitive Features That Contribute To Risk:  Loss of executive function    Suicide Risk:  Minimal: No identifiable suicidal ideation.  Patients presenting with no risk factors but with morbid ruminations; may be classified as minimal risk based on the severity of the depressive symptoms  Discharge Diagnoses:  Same as above diagnoses  Plan Of Care/Follow-up recommendations:  Activity:  Resume usual activity Diet:  Resume usual diet  Is patient on multiple antipsychotic therapies at discharge:  No   Has  Patient had three or more failed trials of antipsychotic monotherapy by history:  No  Recommended Plan for Multiple Antipsychotic Therapies: NA  Rankin, Shuvon, FNP-BC 11/04/2013 4:43 PM

## 2013-11-04 NOTE — Progress Notes (Addendum)
   CARE MANAGEMENT ED NOTE 11/04/2013  Patient:  Daniel NonesWILLIAMS,Kamarian   Account Number:  [id number]  Date Initiated:  11/04/2013  Documentation initiated by:  Edd ArbourGIBBS,KIMBERLY  Subjective/Objective Assessment:   33 yr [place] without pcp listed pt confirms he does not have a pcp     Subjective/Objective Assessment Detail:   medically cleared by Dr bednar Pending [facility] staff assessment/documentation  CM unable to find [facility]     Action/Plan:   CM provided pt with [place] of [place] Discussed with him that he can receive [facility] form [facility] staff members   Action/Plan Detail:   Anticipated DC Date:       Status Recommendation to Physician:   Result of Recommendation:    Other ED Services  Consult Working Plan    DC [employer]  Other  PCP issues  Outpatient Services - Pt will follow up    Choice offered to / List presented to:            Status of service:  Completed, signed off  ED Comments:   ED Comments Detail:   11/04/13 1510 Pt inquired about "how long do I have to stay?" CM [facility] on [facility] Plans are for d/c after pt is provided with injectable medication (pt missed dose recently) and D/C  ED Rn, Riki RuskJeremy updated  [facility] resources and forms for him and would see him in possibly next 1-2 hours

## 2013-11-04 NOTE — ED Notes (Addendum)
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 Pt has a hx of schizoaffective disorder.  Pt noted to be anxious, able to read a paper for EMS, tracks finger movements. Pt a&o x4, can become agitated easily. Pt reports that "I have peace in my home and I don't want to tamper with that by not seeing what is in my way. I cannot be near the stove because I worry about the heat. I can't use the microwave because I'm worried I will put a fork in it and not see it." Pt denies SI/ HI / AVH

## 2013-11-04 NOTE — ED Notes (Signed)
Pt requesting to take his at home medications, Cogentin and Depakote at this time, MD stated that this is fine. Pt given water to take medications

## 2013-11-04 NOTE — ED Notes (Signed)
TTS consult-spoke with Steven Clayton

## 2013-11-04 NOTE — ED Provider Notes (Signed)
CSN: [id number]     Arrival date & time 11/04/13  0142 History   First MD Initiated Contact with Patient 11/04/13 [phone]     Chief Complaint  Patient presents with  . 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 The patient denies any threats to harm himself or others denies suicidal or homicidal ideation denies hallucinations. There is no treatment prior to arrival. Patient has no pain no headache no shortness of breath no change in speech or other concerns. Past Medical History  Diagnosis Date  . Schizoaffective disorder    History reviewed. No pertinent past surgical history. History reviewed. No pertinent family history. History  Substance Use Topics  . Smoking status: Current Some Day Smoker  . Smokeless tobacco: Never Used  . Alcohol Use: 0.6 oz/week    1 Glasses of wine per week    Review of Systems 10 Systems reviewed and are negative for acute change except as noted in the HPI.   Allergies  Review of patient's allergies indicates no known allergies.  Home Medications   Current Outpatient Rx  Name  Route  Sig  Dispense  Refill  . benztropine (COGENTIN) 1 MG tablet   Oral   Take 1 mg by mouth daily.         . divalproex (DEPAKOTE ER) 500 MG 24 hr tablet   Oral   Take 500 mg by mouth daily.         Marland Kitchen lisinopril-hydrochlorothiazide (PRINZIDE,ZESTORETIC) 20-12.5 MG per tablet   Oral   Take 1 tablet by mouth daily.         . risperiDONE microspheres (RISPERDAL CONSTA) 37.5 MG injection   Intramuscular   Inject 37.5 mg into the muscle every 14 (fourteen) days.         . benztropine (COGENTIN) 1 MG  tablet   Oral   Take 1 tablet (1 mg total) by mouth daily.   30 tablet   0   . divalproex (DEPAKOTE ER) 500 MG 24 hr tablet   Oral   Take 1 tablet (500 mg total) by mouth daily.   30 tablet   0    BP 138/89  Pulse 63  Temp(Src) 98.5 F (36.9 C) (Oral)  Resp 18  SpO2 100% Physical Exam  Nursing note and vitals reviewed. Constitutional:  Awake, alert, nontoxic appearance.  HENT:  Head: Atraumatic.  Eyes: Right eye exhibits no discharge. Left eye exhibits no discharge.  Neck: Neck supple.  Cardiovascular: Normal rate and regular rhythm.   No murmur heard. Pulmonary/Chest: Effort normal and breath sounds normal. No respiratory distress. He has no wheezes. He has no rales. He exhibits no tenderness.  Abdominal: Soft. There is no tenderness. There is no rebound.  Musculoskeletal: He exhibits no tenderness.  Baseline ROM, no obvious new focal weakness.  Neurological: He is alert.  Mental status and motor strength appears baseline for patient and situation.  Skin: No rash noted.  Psychiatric: He has a normal mood and affect.    ED Course  Procedures (including critical care time) Patient understand and agree with initial ED impression and plan with expectations set for ED visit. TTS ordered;  Dispo pending. [id number] Labs Review Labs Reviewed  CBC - Abnormal; Notable for the following:    RBC 4.17 (*)    Hemoglobin 12.4 (*)    HCT 35.6 (*)    All other components within normal limits  COMPREHENSIVE METABOLIC PANEL - Abnormal; Notable for the following:    Glucose, Bld 100 (*)    Total Bilirubin <0.2 (*)    GFR calc non Af Amer 84 (*)    All other components within normal limits  VALPROIC ACID LEVEL - Abnormal; Notable for the following:    Valproic Acid Lvl 39.3 (*)    All other components within normal limits  ETHANOL  URINE RAPID DRUG SCREEN (HOSP PERFORMED)   Imaging Review No results found.   EKG Interpretation None      MDM   Final diagnoses:  Psychosis   Schizoaffective disorder  Dispo pending.    Hurman HornJohn M Martie Muhlbauer, MD 11/07/13 [phone]

## 2013-11-04 NOTE — ED Notes (Signed)
Bed: [id number] Expected date:  Expected time:  Means of arrival:  Comments: EMS/32 yo male-blindness caused by the devil

## 2013-11-05 NOTE — Consult Note (Signed)
Face to face evaluation and I agree with this note 

## 2014-09-23 DIAGNOSIS — F250 Schizoaffective disorder, bipolar type: Secondary | ICD-10-CM | POA: Diagnosis not present

## 2015-01-18 ENCOUNTER — Encounter (HOSPITAL_COMMUNITY): Payer: Self-pay | Admitting: Emergency Medicine

## 2015-01-18 ENCOUNTER — Emergency Department (HOSPITAL_COMMUNITY)
Admission: EM | Admit: 2015-01-18 | Discharge: 2015-01-18 | Disposition: A | Discharge status: Other Acute Inpatient Hospital | Payer: Medicare Other | Attending: Emergency Medicine | Admitting: Emergency Medicine

## 2015-01-18 ENCOUNTER — Encounter (HOSPITAL_COMMUNITY): Payer: Self-pay | Admitting: Behavioral Health

## 2015-01-18 ENCOUNTER — Inpatient Hospital Stay (HOSPITAL_COMMUNITY)
Admission: AD | Admit: 2015-01-18 | Discharge: 2015-01-25 | DRG: 885 | Disposition: A | Discharge status: Home / Self Care | Payer: Medicare Other | Source: Intra-hospital | Attending: Psychiatry | Admitting: Psychiatry

## 2015-01-18 DIAGNOSIS — R45851 Suicidal ideations: Secondary | ICD-10-CM | POA: Diagnosis present

## 2015-01-18 DIAGNOSIS — F259 Schizoaffective disorder, unspecified: Secondary | ICD-10-CM | POA: Insufficient documentation

## 2015-01-18 DIAGNOSIS — F17210 Nicotine dependence, cigarettes, uncomplicated: Secondary | ICD-10-CM | POA: Diagnosis present

## 2015-01-18 DIAGNOSIS — Z720 Tobacco use: Secondary | ICD-10-CM | POA: Insufficient documentation

## 2015-01-18 DIAGNOSIS — R440 Auditory hallucinations: Secondary | ICD-10-CM

## 2015-01-18 DIAGNOSIS — F29 Unspecified psychosis not due to a substance or known physiological condition: Secondary | ICD-10-CM

## 2015-01-18 DIAGNOSIS — R441 Visual hallucinations: Secondary | ICD-10-CM | POA: Diagnosis not present

## 2015-01-18 DIAGNOSIS — F17200 Nicotine dependence, unspecified, uncomplicated: Secondary | ICD-10-CM | POA: Diagnosis present

## 2015-01-18 DIAGNOSIS — G4700 Insomnia, unspecified: Secondary | ICD-10-CM | POA: Diagnosis present

## 2015-01-18 DIAGNOSIS — F250 Schizoaffective disorder, bipolar type: Principal | ICD-10-CM | POA: Diagnosis present

## 2015-01-18 DIAGNOSIS — E876 Hypokalemia: Secondary | ICD-10-CM | POA: Diagnosis present

## 2015-01-18 DIAGNOSIS — F419 Anxiety disorder, unspecified: Secondary | ICD-10-CM | POA: Diagnosis present

## 2015-01-18 DIAGNOSIS — Z79899 Other long term (current) drug therapy: Secondary | ICD-10-CM | POA: Diagnosis not present

## 2015-01-18 DIAGNOSIS — D509 Iron deficiency anemia, unspecified: Secondary | ICD-10-CM | POA: Diagnosis present

## 2015-01-18 LAB — RAPID URINE DRUG SCREEN, HOSP PERFORMED
AMPHETAMINES: NOT DETECTED
BENZODIAZEPINES: NOT DETECTED
Barbiturates: NOT DETECTED
Cocaine: NOT DETECTED
Opiates: NOT DETECTED
TETRAHYDROCANNABINOL: NOT DETECTED

## 2015-01-18 LAB — COMPREHENSIVE METABOLIC PANEL
ALT: 15 U/L — ABNORMAL LOW (ref 17–63)
AST: 41 U/L (ref 15–41)
Albumin: 4.5 g/dL (ref 3.5–5.0)
Alkaline Phosphatase: 68 U/L (ref 38–126)
Anion gap: 8 (ref 5–15)
BUN: 19 mg/dL (ref 6–20)
CO2: 22 mmol/L (ref 22–32)
Calcium: 9.7 mg/dL (ref 8.9–10.3)
Chloride: 108 mmol/L (ref 101–111)
Creatinine, Ser: 1.18 mg/dL (ref 0.61–1.24)
GFR calc Af Amer: >60 mL/min (ref >60)
GFR calc non Af Amer: >60 mL/min (ref >60)
Glucose, Bld: 119 mg/dL — ABNORMAL HIGH (ref 65–99)
Potassium: 3.2 mmol/L — ABNORMAL LOW (ref 3.5–5.1)
Sodium: 138 mmol/L (ref 135–145)
Total Bilirubin: 0.4 mg/dL (ref 0.3–1.2)
Total Protein: 7.2 g/dL (ref 6.5–8.1)

## 2015-01-18 LAB — CBC
HCT: 37.5 % — ABNORMAL LOW (ref 39.0–52.0)
Hemoglobin: 12.8 g/dL — ABNORMAL LOW (ref 13.0–17.0)
MCH: 29.4 pg (ref 26.0–34.0)
MCHC: 34.1 g/dL (ref 30.0–36.0)
MCV: 86 fL (ref 78.0–100.0)
Platelets: 211 10*3/uL (ref 150–400)
RBC: 4.36 MIL/uL (ref 4.22–5.81)
RDW: 13.3 % (ref 11.5–15.5)
WBC: 9 10*3/uL (ref 4.0–10.5)

## 2015-01-18 LAB — ACETAMINOPHEN LEVEL: Acetaminophen (Tylenol), Serum: <10 ug/mL — ABNORMAL LOW (ref 10–30)

## 2015-01-18 LAB — ETHANOL: Alcohol, Ethyl (B): <5 mg/dL (ref <5)

## 2015-01-18 LAB — SALICYLATE LEVEL: Salicylate Lvl: <4 mg/dL (ref 2.8–30.0)

## 2015-01-18 MED ORDER — DIVALPROEX SODIUM ER 500 MG PO TB24
500.0000 mg | ORAL_TABLET | Freq: Two times a day (BID) | ORAL | Status: DC
  Administered 2015-01-18 – 2015-01-25 (×14): 500 mg via ORAL
  Filled 2015-01-18 (×7): qty 1
  Filled 2015-01-18: qty 6
  Filled 2015-01-18 (×6): qty 1
  Filled 2015-01-18: qty 6
  Filled 2015-01-18 (×6): qty 1

## 2015-01-18 MED ORDER — DIVALPROEX SODIUM ER 500 MG PO TB24
500.0000 mg | ORAL_TABLET | Freq: Every day | ORAL | Status: DC
  Administered 2015-01-18: 500 mg via ORAL
  Filled 2015-01-18: qty 1

## 2015-01-18 MED ORDER — POTASSIUM CHLORIDE CRYS ER 20 MEQ PO TBCR
40.0000 meq | EXTENDED_RELEASE_TABLET | Freq: Once | ORAL | Status: AC
  Administered 2015-01-18: 40 meq via ORAL
  Filled 2015-01-18: qty 2

## 2015-01-18 MED ORDER — BENZTROPINE MESYLATE 1 MG PO TABS
1.0000 mg | ORAL_TABLET | Freq: Every day | ORAL | Status: DC
  Administered 2015-01-19 – 2015-01-20 (×2): 1 mg via ORAL
  Filled 2015-01-18 (×4): qty 1

## 2015-01-18 MED ORDER — RISPERIDONE 1 MG PO TABS
1.0000 mg | ORAL_TABLET | Freq: Every day | ORAL | Status: DC
  Administered 2015-01-18: 1 mg via ORAL
  Filled 2015-01-18 (×3): qty 1

## 2015-01-18 MED ORDER — HYDROCHLOROTHIAZIDE 12.5 MG PO CAPS
12.5000 mg | ORAL_CAPSULE | Freq: Every day | ORAL | Status: DC
  Administered 2015-01-19 – 2015-01-25 (×7): 12.5 mg via ORAL
  Filled 2015-01-18 (×10): qty 1

## 2015-01-18 MED ORDER — PNEUMOCOCCAL VAC POLYVALENT 25 MCG/0.5ML IJ INJ
0.5000 mL | INJECTION | INTRAMUSCULAR | Status: AC
  Administered 2015-01-19: 0.5 mL via INTRAMUSCULAR

## 2015-01-18 MED ORDER — NICOTINE 14 MG/24HR TD PT24
14.0000 mg | MEDICATED_PATCH | Freq: Every day | TRANSDERMAL | Status: DC
  Administered 2015-01-19 – 2015-01-25 (×7): 14 mg via TRANSDERMAL
  Filled 2015-01-18 (×5): qty 1
  Filled 2015-01-18: qty 14
  Filled 2015-01-18 (×4): qty 1

## 2015-01-18 MED ORDER — LORAZEPAM 1 MG PO TABS
1.0000 mg | ORAL_TABLET | Freq: Three times a day (TID) | ORAL | Status: DC | PRN
  Administered 2015-01-18: 1 mg via ORAL
  Filled 2015-01-18: qty 1

## 2015-01-18 MED ORDER — ACETAMINOPHEN 325 MG PO TABS: 650.0000 mg | ORAL_TABLET | ORAL | Status: DC | PRN

## 2015-01-18 MED ORDER — BENZTROPINE MESYLATE 1 MG PO TABS
1.0000 mg | ORAL_TABLET | Freq: Every day | ORAL | Status: DC
  Administered 2015-01-18: 1 mg via ORAL
  Filled 2015-01-18: qty 1

## 2015-01-18 MED ORDER — DIVALPROEX SODIUM ER 500 MG PO TB24
500.0000 mg | ORAL_TABLET | Freq: Every day | ORAL | Status: DC
  Filled 2015-01-18: qty 1

## 2015-01-18 MED ORDER — HYDROCHLOROTHIAZIDE 12.5 MG PO CAPS
12.5000 mg | ORAL_CAPSULE | Freq: Every day | ORAL | Status: DC
  Administered 2015-01-18: 12.5 mg via ORAL
  Filled 2015-01-18: qty 1

## 2015-01-18 MED ORDER — LISINOPRIL 20 MG PO TABS
20.0000 mg | ORAL_TABLET | Freq: Every day | ORAL | Status: DC
  Administered 2015-01-19 – 2015-01-25 (×7): 20 mg via ORAL
  Filled 2015-01-18 (×10): qty 1

## 2015-01-18 MED ORDER — LISINOPRIL-HYDROCHLOROTHIAZIDE 20-12.5 MG PO TABS: 1.0000 | ORAL_TABLET | Freq: Every day | ORAL | Status: DC

## 2015-01-18 MED ORDER — NICOTINE 14 MG/24HR TD PT24
14.0000 mg | MEDICATED_PATCH | Freq: Every day | TRANSDERMAL | Status: DC
  Administered 2015-01-18: 14 mg via TRANSDERMAL
  Filled 2015-01-18: qty 1

## 2015-01-18 MED ORDER — ALUM & MAG HYDROXIDE-SIMETH 200-200-20 MG/5ML PO SUSP: 30.0000 mL | ORAL | Status: DC | PRN

## 2015-01-18 MED ORDER — ACETAMINOPHEN 325 MG PO TABS
650.0000 mg | ORAL_TABLET | Freq: Four times a day (QID) | ORAL | Status: DC | PRN
  Administered 2015-01-21: 650 mg via ORAL
  Filled 2015-01-18: qty 2

## 2015-01-18 MED ORDER — LISINOPRIL 20 MG PO TABS
20.0000 mg | ORAL_TABLET | Freq: Every day | ORAL | Status: DC
  Administered 2015-01-18: 20 mg via ORAL
  Filled 2015-01-18: qty 1

## 2015-01-18 MED ORDER — MAGNESIUM HYDROXIDE 400 MG/5ML PO SUSP: 30.0000 mL | Freq: Every day | ORAL | Status: DC | PRN

## 2015-01-18 MED ORDER — IBUPROFEN 200 MG PO TABS: 600.0000 mg | ORAL_TABLET | Freq: Three times a day (TID) | ORAL | Status: DC | PRN

## 2015-01-18 MED ORDER — POTASSIUM CHLORIDE CRYS ER 20 MEQ PO TBCR
20.0000 meq | EXTENDED_RELEASE_TABLET | Freq: Two times a day (BID) | ORAL | Status: AC
  Administered 2015-01-18 – 2015-01-20 (×4): 20 meq via ORAL
  Filled 2015-01-18 (×6): qty 1

## 2015-01-18 NOTE — ED Notes (Signed)
Up to the bathroom 

## 2015-01-18 NOTE — BH Assessment (Signed)
Tele Assessment Note   Arhum Peeples is a 34 y.o. male who voluntarily presents to [facility] with c/o SI thoughts and AVH.  Pt denies SI to this writer, has no plan or intent to harm himself.  Pt told medical staff that he usually didn't feel the way he does but he received a bad grade and graduated from high school.  Pt is manic, responding to internal stimuli and rambling.  He has loud, pressured/rapid speech and he is animated at times during the interview.  Pt moves very rapidly between thoughts and there is some difficulty with re-directing the pt to remain focused during the interview.  The pt proceeded to to tell this writer his father's current incarceration for murder, unk if this event took place.  He also discussed his 9 yr old brother being shot and the funeral.  The pt then remarked--"its not cool to be a zombie or dead".  Pt says he sees dead people around and they are holding him hostage.  When asked about abuse hx, he replied he didn't have any STD's, because he's seen people with the diseases.     Axis I: Schizoaffective Disorder Axis II: Deferred Axis III:  Past Medical History  Diagnosis Date  . Schizoaffective disorder    Axis IV: other psychosocial or environmental problems, problems related to social environment and problems with primary support group Axis V: 21-30 behavior considerably influenced by delusions or hallucinations OR serious impairment in judgment, communication OR inability to function in almost all areas  Past Medical History:  Past Medical History  Diagnosis Date  . Schizoaffective disorder     History reviewed. No pertinent past surgical history.  Family History: No family history on file.  Social History:  reports that he has been smoking.  He has never used smokeless tobacco. He reports that he drinks about 0.6 oz of alcohol per week. He reports that he does not use illicit drugs.  Additional Social History:  Alcohol / Drug Use Pain Medications: See  MAR  Prescriptions: See MAR  Over the Counter: See MAR  History of alcohol / drug use?: No history of alcohol / drug abuse Longest period of sobriety (when/how long): None reported   CIWA: CIWA-Ar BP: (!) 131/102 mmHg Pulse Rate: 110 COWS:    PATIENT STRENGTHS: (choose at least two) NA  Allergies: No Known Allergies  Home Medications:  (Not in a hospital admission)  OB/GYN Status:  No LMP for male patient.  General Assessment Data Location of Assessment: [facility] TTS Assessment: In system Is this a Tele or Face-to-Face Assessment?: Tele Assessment Is this an Initial Assessment or a Re-assessment for this encounter?: Initial Assessment Marital status: Single Maiden name: None  Is patient pregnant?: No Pregnancy Status: No Living Arrangements: Alone Can pt return to current living arrangement?: Yes Admission Status: Voluntary Is patient capable of signing voluntary admission?: No Referral Source: MD Insurance type: MCR/MCD  Medical Screening Exam [facility] Walk-in ONLY) Medical Exam completed: No Reason for MSE not completed: Other: (None )  Crisis Care Plan Living Arrangements: Alone Name of Psychiatrist: Yes but cannot tell this writer the name of the Dr Name of Therapist: None   Education Status Is patient currently in school?: No Current Grade: None  Highest grade of school patient has completed: None  Name of school: None  Contact person: None   Risk to self with the past 6 months Suicidal Ideation: No Has patient been a risk to self within the past 6  months prior to admission? : No Suicidal Intent: No Has patient had any suicidal intent within the past 6 months prior to admission? : No Is patient at risk for suicide?: No Suicidal Plan?: No Has patient had any suicidal plan within the past 6 months prior to admission? : No Access to Means: No What has been your use of drugs/alcohol within the last 12 months?: Pt denies  Previous Attempts/Gestures: No How  many times?: 0 Other Self Harm Risks: None  Triggers for Past Attempts: None known Intentional Self Injurious Behavior: None Family Suicide History: No Recent stressful life event(s): Other (Comment) (Chronic mental illness ) Persecutory voices/beliefs?: Yes Depression: No Depression Symptoms:  (None reported ) Substance abuse history and/or treatment for substance abuse?: Yes Suicide prevention information given to non-admitted patients: Not applicable  Risk to Others within the past 6 months Homicidal Ideation: No Does patient have any lifetime risk of violence toward others beyond the six months prior to admission? : No Thoughts of Harm to Others: No Current Homicidal Intent: No Current Homicidal Plan: No Access to Homicidal Means: No Identified Victim: None  History of harm to others?: No Assessment of Violence: None Noted Violent Behavior Description: None  Does patient have access to weapons?: No Criminal Charges Pending?: No Describe Pending Criminal Charges: None  Does patient have a court date: No Is patient on probation?: No  Psychosis Hallucinations: Auditory, Visual Delusions: None noted  Mental Status Report Appearance/Hygiene: In scrubs Eye Contact: Good Motor Activity: Freedom of movement Speech: Pressured, Rapid, Loud Level of Consciousness: Alert Mood: Preoccupied, Anxious Affect: Preoccupied, Anxious Anxiety Level: Moderate Thought Processes: Flight of Ideas, Tangential Judgement: Impaired Orientation: Place, Situation, Person Obsessive Compulsive Thoughts/Behaviors: Moderate  Cognitive Functioning Concentration: Decreased Memory: Recent Intact, Remote Intact IQ: Average Insight: Poor Impulse Control: Fair Appetite: Fair Weight Loss: 0 Weight Gain: 0 Sleep: No Change Total Hours of Sleep: 6 Vegetative Symptoms: None  ADLScreening [facility] Assessment Services) Patient's cognitive ability adequate to safely complete daily activities?:  Yes Patient able to express need for assistance with ADLs?: Yes Independently performs ADLs?: Yes (appropriate for developmental age)  Prior Inpatient Therapy Prior Inpatient Therapy: Yes Prior Therapy Dates: 2004.2006 Prior Therapy Facilty/Provider(s): [facility], Willy Eddy  Reason for Treatment: Scizphrenia   Prior Outpatient Therapy Prior Outpatient Therapy: Yes Prior Therapy Dates: Current  Prior Therapy Facilty/Provider(s): Unable to tell this Clinical research associate than name of the provider  Reason for Treatment: Med Mgt  Does patient have an ACCT team?: No Does patient have Intensive In-House Services?  : No Does patient have [facility] services? : No Does patient have P4CC services?: No  ADL Screening (condition at time of admission) Patient's cognitive ability adequate to safely complete daily activities?: Yes Is the patient deaf or have difficulty hearing?: No Does the patient have difficulty seeing, even when wearing glasses/contacts?: No Does the patient have difficulty concentrating, remembering, or making decisions?: Yes Patient able to express need for assistance with ADLs?: Yes Does the patient have difficulty dressing or bathing?: No Independently performs ADLs?: Yes (appropriate for developmental age) Does the patient have difficulty walking or climbing stairs?: No Weakness of Legs: None Weakness of Arms/Hands: None  Home Assistive Devices/Equipment Home Assistive Devices/Equipment: None  Therapy Consults (therapy consults require a physician order) PT Evaluation Needed: No OT Evalulation Needed: No SLP Evaluation Needed: No Abuse/Neglect Assessment (Assessment to be complete while patient is alone) Physical Abuse: Denies Verbal Abuse: Denies Sexual Abuse: Denies Exploitation of patient/patient's resources: Denies Self-Neglect: Denies Values /  Beliefs Cultural Requests During Hospitalization: None Spiritual Requests During Hospitalization: None Consults Spiritual Care  Consult Needed: No Social Work Consult Needed: No Merchant navy officerAdvance Directives (For Healthcare) Does patient have an advance directive?: No Would patient like information on creating an advanced directive?: No - patient declined information    Additional Information 1:1 In Past 12 Months?: No CIRT Risk: No Elopement Risk: No Does patient have medical clearance?: Yes     Disposition:  Disposition Initial Assessment Completed for this Encounter: Yes Disposition of Patient: Inpatient treatment program, Referred to (Per Hulan FessIjeoma Nwaeze, NP meets critertia for inpt admission ) Type of inpatient treatment program: Adult Patient referred to: Other (Comment) (Per Hulan FessIjeoma Nwaeze, NP meets criteria for inpt admission )  Murrell ReddenSimmons, Rhaya Coale C 01/18/2015 2:10 AM

## 2015-01-18 NOTE — Tx Team (Signed)
Initial Interdisciplinary Treatment Plan   PATIENT STRESSORS: Medication change or noncompliance   PATIENT STRENGTHS: Ability for insight Capable of independent living Motivation for treatment/growth   PROBLEM LIST: Problem List/Patient Goals Date to be addressed Date deferred Reason deferred Estimated date of resolution  Auditory hallucination s 01/18/15     Noncompliant with meds 01/18/15                                                DISCHARGE CRITERIA:  Ability to meet basic life and health needs Adequate post-discharge living arrangements Improved stabilization in mood, thinking, and/or behavior Verbal commitment to aftercare and medication compliance  PRELIMINARY DISCHARGE PLAN: Attend aftercare/continuing care group Attend PHP/IOP  PATIENT/FAMIILY INVOLVEMENT: This treatment plan has been presented to and reviewed with the patient, Steven Clayton, and/or family member.  The patient and family have been given the opportunity to ask questions and make suggestions.  [name] L 01/18/2015, 3:29 PM

## 2015-01-18 NOTE — Progress Notes (Signed)
Patient ID: Steven Clayton, male   DOB: 07/17/1981, 34 y.o.   MRN: [id number] PER STATE REGULATIONS 482.30  THIS CHART WAS REVIEWED FOR MEDICAL NECESSITY WITH RESPECT TO THE PATIENT'S ADMISSION/DURATION OF STAY.  NEXT REVIEW DATE: 01/22/15  Loura HaltBARBARA Joydan Gretzinger, RN, BSN CASE MANAGER

## 2015-01-18 NOTE — ED Provider Notes (Signed)
CSN: [id number]     Arrival date & time 01/18/15  0017 History   First MD Initiated Contact with Patient 01/18/15 0021     Chief Complaint  Patient presents with  . Suicidal     (Consider location/radiation/quality/duration/timing/severity/associated sxs/prior Treatment) HPI Comments: Presents to the emergency department with hearing voices.  He is very tangential in his conversation.  He does not answer direct questions, makes good eye contact.  He has cooperative, but extremely manic and his presentation  The history is provided by the patient.    Past Medical History  Diagnosis Date  . Schizoaffective disorder    History reviewed. No pertinent past surgical history. No family history on file. History  Substance Use Topics  . Smoking status: Current Some Day Smoker  . Smokeless tobacco: Never Used  . Alcohol Use: 0.6 oz/week    1 Glasses of wine per week    Review of Systems  Constitutional: Negative for fever.  Respiratory: Negative for shortness of breath.   Gastrointestinal: Negative for abdominal pain.  Psychiatric/Behavioral: Positive for suicidal ideas.  All other systems reviewed and are negative.     Allergies  Review of patient's allergies indicates no known allergies.  Home Medications   Prior to Admission medications   Medication Sig Start Date End Date Taking? Authorizing Provider  benztropine (COGENTIN) 1 MG tablet Take 1 mg by mouth daily.    Historical Provider, MD  benztropine (COGENTIN) 1 MG tablet Take 1 tablet (1 mg total) by mouth daily. 11/04/13   Shuvon B Rankin, NP  divalproex (DEPAKOTE ER) 500 MG 24 hr tablet Take 500 mg by mouth daily.    Historical Provider, MD  divalproex (DEPAKOTE ER) 500 MG 24 hr tablet Take 1 tablet (500 mg total) by mouth daily. 11/04/13   Shuvon B Rankin, NP  lisinopril-hydrochlorothiazide (PRINZIDE,ZESTORETIC) 20-12.5 MG per tablet Take 1 tablet by mouth daily.    Historical Provider, MD  risperiDONE microspheres  (RISPERDAL CONSTA) 37.5 MG injection Inject 37.5 mg into the muscle every 14 (fourteen) days.    Historical Provider, MD   BP 131/102 mmHg  Pulse 110  Temp(Src) 99.1 F (37.3 C) (Oral)  Resp 20  SpO2 99% Physical Exam  Constitutional: He appears well-developed and well-nourished.  Eyes: Pupils are equal, round, and reactive to light.  Neck: Normal range of motion.  Cardiovascular: Normal rate.   Musculoskeletal: Normal range of motion.  Neurological: He is alert.  Psychiatric: His affect is labile. His speech is rapid and/or pressured and tangential. He is actively hallucinating. Thought content is paranoid and delusional. Cognition and memory are impaired. He expresses impulsivity.  Nursing note and vitals reviewed.   ED Course  Procedures (including critical care time) Labs Review Labs Reviewed  ACETAMINOPHEN LEVEL  CBC  COMPREHENSIVE METABOLIC PANEL  ETHANOL  SALICYLATE LEVEL  URINE RAPID DRUG SCREEN (HOSP PERFORMED) [facility]    Imaging Review No results found.   EKG Interpretation None      MDM   Final diagnoses:  None         Earley FavorGail Darshay Deupree, NP 01/18/15 [id number]  Devoria AlbeIva Knapp, MD 01/18/15 [id number]

## 2015-01-18 NOTE — BH Assessment (Signed)
[facility] Assessment Progress Note  Per Thedore MinsMojeed Akintayo, MD, this pt requires psychiatric hospitalization at this time.  Berneice Heinrichina Tate, RN, [facility]  Pt has signed Voluntary Admission and Consent for Treatment, as well as Consent to Release Information, and signed [facility].  Pt's nurse, Wille CelesteJanie, has been notified, and agrees to send original paperwork along with pt via [facility], and to call report to [phone].  Doylene Canninghomas Jamaia Brum, MA Triage Specialist [phone]

## 2015-01-18 NOTE — ED Notes (Addendum)
[facility] here to transport.  Ambulatory w/o difficulty, belongings sent w/ driver.

## 2015-01-18 NOTE — ED Notes (Signed)
Pt assessed by TTS Terri.

## 2015-01-18 NOTE — ED Notes (Signed)
[facility] contacted for transport 

## 2015-01-18 NOTE — ED Notes (Signed)
Pt presents very manic, talking loud, pressured speech.  Pt denies SI, HI.  Pt reports hearing voices, diagnosed with Schizophrenia.  Pt reports his brother recently died.  Pt rambling, at times incoherent speech.  Awake, alert & responsive, no distress noted, Cooperative but anxious, monitoring for safety, Q 15 min checks in effect.

## 2015-01-18 NOTE — H&P (Signed)
Psychiatric Admission Assessment Adult  Patient Identification: Steven Clayton MRN:  [id number] Date of Evaluation:  01/18/2015 Chief Complaint:  "People are putting bad thoughts in by mind when I interact with them."  Principal Diagnosis: Schizoaffective disorder-chronic with exacerbation Diagnosis:   Patient Active Problem List   Diagnosis Date Noted  . Schizoaffective disorder-chronic with exacerbation [F25.8] 01/18/2015  . Schizoaffective disorder, unspecified type [F25.9]   . Schizoaffective disorder [F25.9] 11/04/2013   History of Present Illness::   Steven Clayton is a 34 year old male who presented voluntarily to the [facility] with complaints of suicidal ideation and auditory hallucinations. The patient appears manic and has had great difficulty engaging in assessments due to flight of ideas and tangential speech. He also reported in the [facility] that he sees dead people. His thought processes are very illogical during assessment and he often gives answers that do not match the original question. For instance, when asked about the presence of suicidal thoughts he became upset stating "That only happens to people with Bipolar Disorder." Patient also appears to be delusional and paranoid stating "People are affecting my perspective by sending me bad vibes. I think people are doing that to get to me. A man in a youth program put all my information out there. Now the public knows I am ill. My brother was also shot in the head when he was 5. I am not the killer. It does not affect me emotionally since I did not do anything to him!" [name] was noted to be delusional, paranoid, and manic throughout the assessment. He reported being off his medications for at least three months due to problems with medicaid per his report. The patient became mildly agitated with writer when trying to express his points, which were noted to be very irrational. He appeared to be extremely upset about the way he perceives  other people but had a difficult times articulating his thoughts. He reports being at several mental health facilities in the past including [facility] and [facility].   Elements:  Location: [facility] for mania/psychosis  Quality: acute. Severity: severe. Timing: constant. Duration: constant. Context: few days. Associated Signs/Symptoms: Depression Symptoms:  depressed mood, psychomotor agitation, feelings of worthlessness/guilt, recurrent thoughts of death, anxiety, (Hypo) Manic Symptoms:  Delusions, Distractibility, Elevated Mood, Flight of Ideas, Hallucinations, Impulsivity, Irritable Mood, Labiality of Mood, Anxiety Symptoms:  Denies Psychotic Symptoms:  Delusions, Hallucinations: Auditory Paranoia, PTSD Symptoms: Negative Total Time spent with patient: 1 hour  Past Medical History:  Past Medical History  Diagnosis Date  . Schizoaffective disorder    History reviewed. No pertinent past surgical history. Family History: History reviewed. No pertinent family history. Social History:  History  Alcohol Use  . 0.6 oz/week  . 1 Glasses of wine per week     History  Drug Use No    History   Social History  . Marital Status: Single    Spouse Name: N/A  . Number of Children: N/A  . Years of Education: N/A   Social History Main Topics  . Smoking status: Current Some Day Smoker  . Smokeless tobacco: Never Used  . Alcohol Use: 0.6 oz/week    1 Glasses of wine per week  . Drug Use: No  . Sexual Activity: No   Other Topics Concern  . None   Social History Narrative   Additional Social History:  Musculoskeletal: Strength & Muscle Tone: within normal limits Gait & Station: normal Patient leans: N/A  Psychiatric Specialty Exam: Physical Exam  Constitutional:  Physical exam findings reviewed from the [facility] and I concur with no noted exceptions.   Psychiatric: His mood appears anxious. His speech is rapid  and/or pressured. He is agitated. Thought content is paranoid and delusional. Cognition and memory are normal. He expresses impulsivity.    Review of Systems  Constitutional: Negative.   HENT: Negative.   Eyes: Negative.   Respiratory: Negative.   Cardiovascular: Negative.   Gastrointestinal: Negative.   Genitourinary: Negative.   Musculoskeletal: Negative.   Skin: Negative.   Neurological: Negative.   Endo/Heme/Allergies: Negative.   Psychiatric/Behavioral: Positive for depression, suicidal ideas and hallucinations. Negative for memory loss and substance abuse. The patient is nervous/anxious and has insomnia.     Blood pressure 127/90, pulse 89, temperature 98.5 F (36.9 C), temperature source Oral, resp. rate 18, height _0  (1.753 m), weight 87.544 kg (193 lb), SpO2 100 %.Body mass index is 28.49 kg/(m^2).  General Appearance: Disheveled  Eye Contact::  Good  Speech:  Pressured  Volume:  Fluctuates   Mood:  Angry and Irritable  Affect:  Labile  Thought Process:  Irrelevant  Orientation:  Full (Time, Place, and Person)  Thought Content:  Delusions, Hallucinations: Auditory and Paranoid Ideation  Suicidal Thoughts:  No  Homicidal Thoughts:  No  Memory:  Immediate;   Fair Recent;   Poor Remote;   Poor  Judgement:  Impaired  Insight:  Lacking  Psychomotor Activity:  Increased  Concentration:  Fair  Recall:  [employer] of Knowledge:Fair  Language: Good  Akathisia:  No  Handed:  Right  AIMS (if indicated):     Assets:  Communication Skills Desire for Improvement Leisure Time Physical Health Resilience Social Support  ADL's:  Intact  Cognition: WNL  Sleep:      Risk to Self: Is patient at risk for suicide?: No Risk to Others:   Prior Inpatient Therapy:   Prior Outpatient Therapy:    Alcohol Screening: 1. How often do you have a drink containing alcohol?: Monthly or less 2. How many drinks containing alcohol do you have on a typical day when you are drinking?: 3  or 4 3. How often do you have six or more drinks on one occasion?: Never Preliminary Score: 1 9. Have you or someone else been injured as a result of your drinking?: No 10. Has a relative or friend or a doctor or another health worker been concerned about your drinking or suggested you cut down?: No Alcohol Use Disorder Identification Test Final Score (AUDIT): 2 Brief Intervention: AUDIT score less than 7 or less-screening does not suggest unhealthy drinking-brief intervention not indicated  Allergies:  No Known Allergies Lab Results:  Results for orders placed or performed during the hospital encounter of 01/18/15 (from the past 48 hour(s))  Acetaminophen level     Status: Abnormal   Collection Time: 01/18/15  1:00 AM  Result Value Ref Range   Acetaminophen (Tylenol), Serum <10 (L) 10 - 30 ug/mL    Comment:        THERAPEUTIC CONCENTRATIONS VARY SIGNIFICANTLY. A RANGE OF 10-30 ug/mL MAY BE AN EFFECTIVE CONCENTRATION FOR MANY PATIENTS. HOWEVER, SOME ARE BEST TREATED AT CONCENTRATIONS OUTSIDE THIS RANGE. ACETAMINOPHEN CONCENTRATIONS >150 ug/mL AT 4 HOURS AFTER INGESTION AND >50 ug/mL AT 12 HOURS AFTER INGESTION ARE OFTEN ASSOCIATED WITH TOXIC REACTIONS.   CBC     Status:  Abnormal   Collection Time: 01/18/15  1:00 AM  Result Value Ref Range   WBC 9.0 4.0 - 10.5 K/uL   RBC 4.36 4.22 - 5.81 MIL/uL   Hemoglobin 12.8 (L) 13.0 - 17.0 g/dL   HCT 37.5 (L) 39.0 - 52.0 %   MCV 86.0 78.0 - 100.0 fL   MCH 29.4 26.0 - 34.0 pg   MCHC 34.1 30.0 - 36.0 g/dL   RDW 13.3 11.5 - 15.5 %   Platelets 211 150 - 400 K/uL  Comprehensive metabolic panel     Status: Abnormal   Collection Time: 01/18/15  1:00 AM  Result Value Ref Range   Sodium 138 135 - 145 mmol/L   Potassium 3.2 (L) 3.5 - 5.1 mmol/L   Chloride 108 101 - 111 mmol/L   CO2 22 22 - 32 mmol/L   Glucose, Bld 119 (H) 65 - 99 mg/dL   BUN 19 6 - 20 mg/dL   Creatinine, Ser 1.18 0.61 - 1.24 mg/dL   Calcium 9.7 8.9 - 10.3 mg/dL   Total  Protein 7.2 6.5 - 8.1 g/dL   Albumin 4.5 3.5 - 5.0 g/dL   AST 41 15 - 41 U/L   ALT 15 (L) 17 - 63 U/L   Alkaline Phosphatase 68 38 - 126 U/L   Total Bilirubin 0.4 0.3 - 1.2 mg/dL   GFR calc non Af Amer >60 >60 mL/min   GFR calc Af Amer >60 >60 mL/min    Comment: (NOTE) The eGFR has been calculated using the CKD EPI equation. This calculation has not been validated in all clinical situations. eGFR's persistently <60 mL/min signify possible Chronic Kidney Disease.    Anion gap 8 5 - 15  Ethanol (ETOH)     Status: None   Collection Time: 01/18/15  1:00 AM  Result Value Ref Range   Alcohol, Ethyl (B) <5 <5 mg/dL    Comment:        LOWEST DETECTABLE LIMIT FOR SERUM ALCOHOL IS 11 mg/dL FOR MEDICAL PURPOSES ONLY   Salicylate level     Status: None   Collection Time: 01/18/15  1:00 AM  Result Value Ref Range   Salicylate Lvl <5.1 2.8 - 30.0 mg/dL  Urine Drug Screen     Status: None   Collection Time: 01/18/15  4:00 AM  Result Value Ref Range   Opiates NONE DETECTED NONE DETECTED   Cocaine NONE DETECTED NONE DETECTED   Benzodiazepines NONE DETECTED NONE DETECTED   Amphetamines NONE DETECTED NONE DETECTED   Tetrahydrocannabinol NONE DETECTED NONE DETECTED   Barbiturates NONE DETECTED NONE DETECTED    Comment:        DRUG SCREEN FOR MEDICAL PURPOSES ONLY.  IF CONFIRMATION IS NEEDED FOR ANY PURPOSE, NOTIFY LAB WITHIN 5 DAYS.        LOWEST DETECTABLE LIMITS FOR URINE DRUG SCREEN Drug Class       Cutoff (ng/mL) Amphetamine      1000 Barbiturate      200 Benzodiazepine   700 Tricyclics       174 Opiates          300 Cocaine          300 THC              50    Current Medications: Current Facility-Administered Medications  Medication Dose Route Frequency Provider Last Rate Last Dose  . acetaminophen (TYLENOL) tablet 650 mg  650 mg Oral Q6H PRN Patrecia Pour, NP      .  alum & mag hydroxide-simeth (MAALOX/MYLANTA) 200-200-20 MG/5ML suspension 30 mL  30 mL Oral Q4H PRN  Patrecia Pour, NP      . Derrill Memo ON 01/19/2015] benztropine (COGENTIN) tablet 1 mg  1 mg Oral Daily Patrecia Pour, NP      . divalproex (DEPAKOTE ER) 24 hr tablet 500 mg  500 mg Oral BID Niel Hummer, NP      . Derrill Memo ON 01/19/2015] hydrochlorothiazide (MICROZIDE) capsule 12.5 mg  12.5 mg Oral Daily Patrecia Pour, NP      . Derrill Memo ON 01/19/2015] lisinopril (PRINIVIL,ZESTRIL) tablet 20 mg  20 mg Oral Daily Patrecia Pour, NP      . magnesium hydroxide (MILK OF MAGNESIA) suspension 30 mL  30 mL Oral Daily PRN Patrecia Pour, NP      . Derrill Memo ON 01/19/2015] nicotine (NICODERM CQ - dosed in mg/24 hours) patch 14 mg  14 mg Transdermal Daily Patrecia Pour, NP      . Derrill Memo ON 01/19/2015] pneumococcal 23 valent vaccine (PNU-IMMUNE) injection 0.5 mL  0.5 mL Intramuscular Tomorrow-1000 Saramma Eappen, MD      . risperiDONE (RISPERDAL) tablet 1 mg  1 mg Oral QHS Niel Hummer, NP       PTA Medications: Prescriptions prior to admission  Medication Sig Dispense Refill Last Dose  . benztropine (COGENTIN) 1 MG tablet Take 1 mg by mouth daily.   11/03/2013 at Unknown time  . benztropine (COGENTIN) 1 MG tablet Take 1 tablet (1 mg total) by mouth daily. (Patient not taking: Reported on 01/18/2015) 30 tablet 0   . divalproex (DEPAKOTE ER) 500 MG 24 hr tablet Take 500 mg by mouth daily.   01/17/2015 at Unknown time  . divalproex (DEPAKOTE ER) 500 MG 24 hr tablet Take 1 tablet (500 mg total) by mouth daily. (Patient not taking: Reported on 01/18/2015) 30 tablet 0   . lisinopril-hydrochlorothiazide (PRINZIDE,ZESTORETIC) 20-12.5 MG per tablet Take 1 tablet by mouth daily.   01/17/2015 at Unknown time  . risperiDONE microspheres (RISPERDAL CONSTA) 37.5 MG injection Inject 37.5 mg into the muscle every 14 (fourteen) days.   Past Month at Unknown time    Previous Psychotropic Medications: Yes   Substance Abuse History in the last 12 months:  No.    Consequences of Substance Abuse: NA  Results for orders placed or  performed during the hospital encounter of 01/18/15 (from the past 72 hour(s))  Acetaminophen level     Status: Abnormal   Collection Time: 01/18/15  1:00 AM  Result Value Ref Range   Acetaminophen (Tylenol), Serum <10 (L) 10 - 30 ug/mL    Comment:        THERAPEUTIC CONCENTRATIONS VARY SIGNIFICANTLY. A RANGE OF 10-30 ug/mL MAY BE AN EFFECTIVE CONCENTRATION FOR MANY PATIENTS. HOWEVER, SOME ARE BEST TREATED AT CONCENTRATIONS OUTSIDE THIS RANGE. ACETAMINOPHEN CONCENTRATIONS >150 ug/mL AT 4 HOURS AFTER INGESTION AND >50 ug/mL AT 12 HOURS AFTER INGESTION ARE OFTEN ASSOCIATED WITH TOXIC REACTIONS.   CBC     Status: Abnormal   Collection Time: 01/18/15  1:00 AM  Result Value Ref Range   WBC 9.0 4.0 - 10.5 K/uL   RBC 4.36 4.22 - 5.81 MIL/uL   Hemoglobin 12.8 (L) 13.0 - 17.0 g/dL   HCT 37.5 (L) 39.0 - 52.0 %   MCV 86.0 78.0 - 100.0 fL   MCH 29.4 26.0 - 34.0 pg   MCHC 34.1 30.0 - 36.0 g/dL   RDW 13.3 11.5 - 15.5 %  Platelets 211 150 - 400 K/uL  Comprehensive metabolic panel     Status: Abnormal   Collection Time: 01/18/15  1:00 AM  Result Value Ref Range   Sodium 138 135 - 145 mmol/L   Potassium 3.2 (L) 3.5 - 5.1 mmol/L   Chloride 108 101 - 111 mmol/L   CO2 22 22 - 32 mmol/L   Glucose, Bld 119 (H) 65 - 99 mg/dL   BUN 19 6 - 20 mg/dL   Creatinine, Ser 1.18 0.61 - 1.24 mg/dL   Calcium 9.7 8.9 - 10.3 mg/dL   Total Protein 7.2 6.5 - 8.1 g/dL   Albumin 4.5 3.5 - 5.0 g/dL   AST 41 15 - 41 U/L   ALT 15 (L) 17 - 63 U/L   Alkaline Phosphatase 68 38 - 126 U/L   Total Bilirubin 0.4 0.3 - 1.2 mg/dL   GFR calc non Af Amer >60 >60 mL/min   GFR calc Af Amer >60 >60 mL/min    Comment: (NOTE) The eGFR has been calculated using the CKD EPI equation. This calculation has not been validated in all clinical situations. eGFR's persistently <60 mL/min signify possible Chronic Kidney Disease.    Anion gap 8 5 - 15  Ethanol (ETOH)     Status: None   Collection Time: 01/18/15  1:00 AM   Result Value Ref Range   Alcohol, Ethyl (B) <5 <5 mg/dL    Comment:        LOWEST DETECTABLE LIMIT FOR SERUM ALCOHOL IS 11 mg/dL FOR MEDICAL PURPOSES ONLY   Salicylate level     Status: None   Collection Time: 01/18/15  1:00 AM  Result Value Ref Range   Salicylate Lvl <2.9 2.8 - 30.0 mg/dL  Urine Drug Screen     Status: None   Collection Time: 01/18/15  4:00 AM  Result Value Ref Range   Opiates NONE DETECTED NONE DETECTED   Cocaine NONE DETECTED NONE DETECTED   Benzodiazepines NONE DETECTED NONE DETECTED   Amphetamines NONE DETECTED NONE DETECTED   Tetrahydrocannabinol NONE DETECTED NONE DETECTED   Barbiturates NONE DETECTED NONE DETECTED    Comment:        DRUG SCREEN FOR MEDICAL PURPOSES ONLY.  IF CONFIRMATION IS NEEDED FOR ANY PURPOSE, NOTIFY LAB WITHIN 5 DAYS.        LOWEST DETECTABLE LIMITS FOR URINE DRUG SCREEN Drug Class       Cutoff (ng/mL) Amphetamine      1000 Barbiturate      200 Benzodiazepine   518 Tricyclics       841 Opiates          300 Cocaine          300 THC              50     Observation Level/Precautions:  15 minute checks  Laboratory:  CBC Chemistry Profile UDS  Psychotherapy:  Group and Individual Therapy  Medications:  Increase Depakote to 500 mg bid for improved mood stability, Start Risperdal 1 mg at hs for psychosis  Consultations:  As needed  Discharge Concerns:  Medication compliance, Emotional stability   Estimated LOS: 3-5 days  Other:  Increase collateral information from caretakers   Psychological Evaluations: Yes   Treatment Plan Summary: Daily contact with patient to assess and evaluate symptoms and progress in treatment and Medication management   Treatment Plan/Recommendations:   1. Admit for crisis management and stabilization. Estimated length of stay 5-7 days. 2. Medication management  to reduce current symptoms to base line and improve the patient's level of functioning.  3. Develop treatment plan to decrease risk  of relapse upon discharge of depressive symptoms and the need for readmission. 5. Group therapy to facilitate development of healthy coping skills to use for mood lability.  6. Health care follow up as needed for medical problems.  7. Discharge plan to include therapy to help patient cope with stressors.  8. Call for Consult with Hospitalist for additional specialty patient services as needed.   Medical Decision Making:  Review of Psycho-Social Stressors (1), Review or order clinical lab tests (1), Review and summation of old records (2), Established Problem, Worsening (2), Review of Last Therapy Session (1), Review of Medication Regimen & Side Effects (2) and Review of New Medication or Change in Dosage (2)  I certify that inpatient services furnished can reasonably be expected to improve the patient's condition.   Elmarie Shiley NP-C 5/30/20165:43 PM

## 2015-01-18 NOTE — ED Notes (Signed)
Pt given specimen cup for urine.

## 2015-01-18 NOTE — ED Notes (Signed)
Pt from home c/o suicidal thoughts. He states "I usually don't feel like that unless I got a bad grade, but I graduated high school". Pt states  "ya'll  are lying to me about hearing voices and being schizophrenic". Pt barely letting this RN get a word in. Pt states "Its not cool to be a zombie or dead". He mentions about his 34 year old brother being shot in the head and picturing his funeral.

## 2015-01-18 NOTE — Progress Notes (Signed)
D   Pt is pleasant and cooperative    He has isolated to his room this shift and he is somewhat guarded and paranoid   He took his medications and has other wise been compliant with treatment    A   Verbal support given   Medications administered and effectiveness monitored  Q 15 min checks  R   Pt safe at present

## 2015-01-18 NOTE — Consult Note (Signed)
Steven Clayton Psychiatry Consult   Reason for Consult:  Hallucinations  Referring Physician:  EDP Patient Identification: Steven Clayton MRN:  [id number] Principal Diagnosis: <principal problem not specified> Diagnosis:   Patient Active Problem List   Diagnosis Date Noted  . Schizoaffective disorder [F25.9] 11/04/2013    Priority: High    Total Time spent with patient: 45 minutes  Subjective:   Steven Clayton is a 34 y.o. male patient admitted with psychosis.  HPI:  The patient presented to the ED with an increase in auditory hallucinations telling him "false information, not telling the truth about myself."  Denies suicidal/homicidal ideations but does not want to respond to the voices.  He is better in a quiet room.  Denies alcohol/drug abuse.  Steven Clayton stopped taking his Risperdal Constance 37.5 mg IM a month ago, on a two week schedule.  Steven Clayton is his outpatient providers.  He is agreeable to oral medications.   HPI Elements:   Location:  generalized. Quality:  acute. Severity:  severe. Timing:  constant. Duration:  constant. Context:  few days.  Past Medical History:  Past Medical History  Diagnosis Date  . Schizoaffective disorder    History reviewed. No pertinent past surgical history. Family History: No family history on file. Social History:  History  Alcohol Use  . 0.6 oz/week  . 1 Glasses of wine per week     History  Drug Use No    History   Social History  . Marital Status: Single    Spouse Name: N/A  . Number of Children: N/A  . Years of Education: N/A   Social History Main Topics  . Smoking status: Current Some Day Smoker  . Smokeless tobacco: Never Used  . Alcohol Use: 0.6 oz/week    1 Glasses of wine per week  . Drug Use: No  . Sexual Activity: No   Other Topics Concern  . None   Social History Narrative   Additional Social History:    Pain Medications: See MAR  Prescriptions: See MAR  Over the Counter: See MAR  History of  alcohol / drug use?: No history of alcohol / drug abuse Longest period of sobriety (when/how long): None reported                      Allergies:  No Known Allergies  Labs:  Results for orders placed or performed during the hospital encounter of 01/18/15 (from the past 48 hour(s))  Acetaminophen level     Status: Abnormal   Collection Time: 01/18/15  1:00 AM  Result Value Ref Range   Acetaminophen (Tylenol), Serum <10 (L) 10 - 30 ug/mL    Comment:        THERAPEUTIC CONCENTRATIONS VARY SIGNIFICANTLY. A RANGE OF 10-30 ug/mL MAY BE AN EFFECTIVE CONCENTRATION FOR MANY PATIENTS. HOWEVER, SOME ARE BEST TREATED AT CONCENTRATIONS OUTSIDE THIS RANGE. ACETAMINOPHEN CONCENTRATIONS >150 ug/mL AT 4 HOURS AFTER INGESTION AND >50 ug/mL AT 12 HOURS AFTER INGESTION ARE OFTEN ASSOCIATED WITH TOXIC REACTIONS.   CBC     Status: Abnormal   Collection Time: 01/18/15  1:00 AM  Result Value Ref Range   WBC 9.0 4.0 - 10.5 K/uL   RBC 4.36 4.22 - 5.81 MIL/uL   Hemoglobin 12.8 (L) 13.0 - 17.0 g/dL   HCT 37.5 (L) 39.0 - 52.0 %   MCV 86.0 78.0 - 100.0 fL   MCH 29.4 26.0 - 34.0 pg   MCHC 34.1 30.0 - 36.0 g/dL   RDW  13.3 11.5 - 15.5 %   Platelets 211 150 - 400 K/uL  Comprehensive metabolic panel     Status: Abnormal   Collection Time: 01/18/15  1:00 AM  Result Value Ref Range   Sodium 138 135 - 145 mmol/L   Potassium 3.2 (L) 3.5 - 5.1 mmol/L   Chloride 108 101 - 111 mmol/L   CO2 22 22 - 32 mmol/L   Glucose, Bld 119 (H) 65 - 99 mg/dL   BUN 19 6 - 20 mg/dL   Creatinine, Ser 1.18 0.61 - 1.24 mg/dL   Calcium 9.7 8.9 - 10.3 mg/dL   Total Protein 7.2 6.5 - 8.1 g/dL   Albumin 4.5 3.5 - 5.0 g/dL   AST 41 15 - 41 U/L   ALT 15 (L) 17 - 63 U/L   Alkaline Phosphatase 68 38 - 126 U/L   Total Bilirubin 0.4 0.3 - 1.2 mg/dL   GFR calc non Af Amer >60 >60 mL/min   GFR calc Af Amer >60 >60 mL/min    Comment: (NOTE) The eGFR has been calculated using the CKD EPI equation. This calculation has not  been validated in all clinical situations. eGFR's persistently <60 mL/min signify possible Chronic Kidney Disease.    Anion gap 8 5 - 15  Ethanol (ETOH)     Status: None   Collection Time: 01/18/15  1:00 AM  Result Value Ref Range   Alcohol, Ethyl (B) <5 <5 mg/dL    Comment:        LOWEST DETECTABLE LIMIT FOR SERUM ALCOHOL IS 11 mg/dL FOR MEDICAL PURPOSES ONLY   Salicylate level     Status: None   Collection Time: 01/18/15  1:00 AM  Result Value Ref Range   Salicylate Lvl <9.6 2.8 - 30.0 mg/dL  Urine Drug Screen     Status: None   Collection Time: 01/18/15  4:00 AM  Result Value Ref Range   Opiates NONE DETECTED NONE DETECTED   Cocaine NONE DETECTED NONE DETECTED   Benzodiazepines NONE DETECTED NONE DETECTED   Amphetamines NONE DETECTED NONE DETECTED   Tetrahydrocannabinol NONE DETECTED NONE DETECTED   Barbiturates NONE DETECTED NONE DETECTED    Comment:        DRUG SCREEN FOR MEDICAL PURPOSES ONLY.  IF CONFIRMATION IS NEEDED FOR ANY PURPOSE, NOTIFY LAB WITHIN 5 DAYS.        LOWEST DETECTABLE LIMITS FOR URINE DRUG SCREEN Drug Class       Cutoff (ng/mL) Amphetamine      1000 Barbiturate      200 Benzodiazepine   222 Tricyclics       979 Opiates          300 Cocaine          300 THC              50     Vitals: Blood pressure 129/87, pulse 63, temperature 98.7 F (37.1 C), temperature source Oral, resp. rate 16, SpO2 100 %.  Risk to Self: Suicidal Ideation: No Suicidal Intent: No Is patient at risk for suicide?: No Suicidal Plan?: No Access to Means: No What has been your use of drugs/alcohol within the last 12 months?: Pt denies  How many times?: 0 Other Self Harm Risks: None  Triggers for Past Attempts: None known Intentional Self Injurious Behavior: None Risk to Others: Homicidal Ideation: No Thoughts of Harm to Others: No Current Homicidal Intent: No Current Homicidal Plan: No Access to Homicidal Means: No Identified Victim: None  History of harm to  others?: No Assessment of Violence: None Noted Violent Behavior Description: None  Does patient have access to weapons?: No Criminal Charges Pending?: No Describe Pending Criminal Charges: None  Does patient have a court date: No Prior Inpatient Therapy: Prior Inpatient Therapy: Yes Prior Therapy Dates: 2004.2006 Prior Therapy Facilty/Provider(s): [facility], [facility]  Reason for Treatment: Scizphrenia  Prior Outpatient Therapy: Prior Outpatient Therapy: Yes Prior Therapy Dates: Current  Prior Therapy Facilty/Provider(s): Unable to tell this Probation officer than name of the provider  Reason for Treatment: Med Mgt  Does patient have an ACCT team?: No Does patient have Intensive In-House Services?  : No Does patient have [facility] services? : No Does patient have [facility] services?: No  Current Facility-Administered Medications  Medication Dose Route Frequency Provider Last Rate Last Dose  . acetaminophen (TYLENOL) tablet 650 mg  650 mg Oral Q4H PRN Junius Creamer, NP      . benztropine (COGENTIN) tablet 1 mg  1 mg Oral Daily Junius Creamer, NP   1 mg at 01/18/15 1053  . divalproex (DEPAKOTE ER) 24 hr tablet 500 mg  500 mg Oral Daily Junius Creamer, NP   500 mg at 01/18/15 1053  . hydrochlorothiazide (MICROZIDE) capsule 12.5 mg  12.5 mg Oral Daily Rolland Porter, MD   12.5 mg at 01/18/15 1053  . ibuprofen (ADVIL,MOTRIN) tablet 600 mg  600 mg Oral Q8H PRN Junius Creamer, NP      . lisinopril (PRINIVIL,ZESTRIL) tablet 20 mg  20 mg Oral Daily Rolland Porter, MD   20 mg at 01/18/15 1053  . LORazepam (ATIVAN) tablet 1 mg  1 mg Oral Q8H PRN Junius Creamer, NP   1 mg at 01/18/15 0147  . nicotine (NICODERM CQ - dosed in mg/24 hours) patch 14 mg  14 mg Transdermal Daily Patrecia Pour, NP   14 mg at 01/18/15 1110   Current Outpatient Prescriptions  Medication Sig Dispense Refill  . divalproex (DEPAKOTE ER) 500 MG 24 hr tablet Take 500 mg by mouth daily.    Marland Kitchen lisinopril-hydrochlorothiazide (PRINZIDE,ZESTORETIC) 20-12.5 MG per tablet  Take 1 tablet by mouth daily.    . risperiDONE microspheres (RISPERDAL CONSTA) 37.5 MG injection Inject 37.5 mg into the muscle every 14 (fourteen) days.    . benztropine (COGENTIN) 1 MG tablet Take 1 mg by mouth daily.    . benztropine (COGENTIN) 1 MG tablet Take 1 tablet (1 mg total) by mouth daily. (Patient not taking: Reported on 01/18/2015) 30 tablet 0  . divalproex (DEPAKOTE ER) 500 MG 24 hr tablet Take 1 tablet (500 mg total) by mouth daily. (Patient not taking: Reported on 01/18/2015) 30 tablet 0    Musculoskeletal: Strength & Muscle Tone: within normal limits Gait & Station: normal Patient leans: N/A  Psychiatric Specialty Exam: Physical Exam  Review of Systems  Constitutional: Negative.   HENT: Negative.   Eyes: Negative.   Respiratory: Negative.   Cardiovascular: Negative.   Gastrointestinal: Negative.   Genitourinary: Negative.   Musculoskeletal: Negative.   Skin: Negative.   Neurological: Negative.   Endo/Heme/Allergies: Negative.   Psychiatric/Behavioral: Positive for hallucinations. The patient is nervous/anxious.     Blood pressure 129/87, pulse 63, temperature 98.7 F (37.1 C), temperature source Oral, resp. rate 16, SpO2 100 %.There is no height or weight on file to calculate BMI.  General Appearance: Casual  Eye Contact::  Fair  Speech:  Normal Rate  Volume:  Normal  Mood:  Anxious  Affect:  Flat  Thought Process:  Coherent  Orientation:  Full (Time, Place, and Person)  Thought Content:  Hallucinations: Auditory Command:  telling him to hurt himself and others at times Visual  Suicidal Thoughts:  No  Homicidal Thoughts:  No  Memory:  Immediate;   Fair Recent;   Fair Remote;   Fair  Judgement:  Impaired  Insight:  Fair  Psychomotor Activity:  Decreased  Concentration:  Fair  Recall:  [employer] of Knowledge:Good  Language: Good  Akathisia:  No  Handed:  Right  AIMS (if indicated):     Assets:  Leisure Time Physical Health Resilience Social  Support  ADL's:  Intact  Cognition: WNL  Sleep:      Medical Decision Making: Review of Psycho-Social Stressors (1), Review or order clinical lab tests (1) and Review of Medication Regimen & Side Effects (2)  Treatment Plan Summary: Daily contact with patient to assess and evaluate symptoms and progress in treatment, Medication management and Plan admit to [facility] for stabilization  Plan:  Recommend psychiatric Inpatient admission when medically cleared. Disposition: Johny Sax, PMH-NP 01/18/2015 1:56 PM Patient seen face-to-face for psychiatric evaluation, chart reviewed and case discussed with the physician extender and developed treatment plan. Reviewed the information documented and agree with the treatment plan. Corena Pilgrim, MD

## 2015-01-18 NOTE — Progress Notes (Signed)
Admission note: Pt reported auditory hallucinations telling him to harm other people. Pt reported that he hear voices when other people are around him. Pt denies wanting to act out on those thoughts. Pt denies HI at this time. Pt reported that he's been noncompliant with his meds because he haven't had transportation to get refills. Pt stated that he was receiving Risperdal Consta. Pt reported that he is able to connect with his brother spirit. Pt reported that his brother was shot in the head at age 34 years old in 2004.  Pt reported feeling paranoid due to auditory hallucinations telling him to do negative things. Pt denies illegal drugs. Pt reported drinking wine once a month. Pt is unemployed and on disability for his mental health. Pt v/s taken, belongings searched, skin assessed and required documents signed.

## 2015-01-19 ENCOUNTER — Encounter (HOSPITAL_COMMUNITY): Payer: Self-pay | Admitting: Psychiatry

## 2015-01-19 DIAGNOSIS — F250 Schizoaffective disorder, bipolar type: Secondary | ICD-10-CM | POA: Diagnosis not present

## 2015-01-19 DIAGNOSIS — F17200 Nicotine dependence, unspecified, uncomplicated: Secondary | ICD-10-CM | POA: Diagnosis present

## 2015-01-19 LAB — LIPID PANEL
CHOL/HDL RATIO: 3.8 ratio
CHOLESTEROL: 155 mg/dL (ref 0–200)
HDL: 41 mg/dL (ref >40)
LDL Cholesterol: 91 mg/dL (ref 0–99)
Triglycerides: 117 mg/dL (ref <150)
VLDL: 23 mg/dL (ref 0–40)

## 2015-01-19 LAB — TSH: TSH: 1.896 u[IU]/mL (ref 0.350–4.500)

## 2015-01-19 MED ORDER — ACETAMINOPHEN 325 MG PO TABS: 650.0000 mg | ORAL_TABLET | ORAL | Status: DC | PRN

## 2015-01-19 MED ORDER — LORAZEPAM 1 MG PO TABS
1.0000 mg | ORAL_TABLET | Freq: Three times a day (TID) | ORAL | Status: DC | PRN
  Administered 2015-01-19: 1 mg via ORAL
  Filled 2015-01-19: qty 1

## 2015-01-19 MED ORDER — RISPERIDONE 2 MG PO TABS
2.0000 mg | ORAL_TABLET | Freq: Every day | ORAL | Status: DC
  Administered 2015-01-19 – 2015-01-20 (×2): 2 mg via ORAL
  Filled 2015-01-19 (×5): qty 1

## 2015-01-19 MED ORDER — ENSURE ENLIVE PO LIQD
237.0000 mL | Freq: Two times a day (BID) | ORAL | Status: DC
  Administered 2015-01-19 – 2015-01-25 (×12): 237 mL via ORAL

## 2015-01-19 MED ORDER — IBUPROFEN 600 MG PO TABS: 600.0000 mg | ORAL_TABLET | Freq: Three times a day (TID) | ORAL | Status: DC | PRN

## 2015-01-19 NOTE — [facility] Suicide Risk Assessment (Signed)
[facility] Admission Suicide Risk Assessment   Nursing information obtained from:  Patient Demographic factors:  Male, Steven Clayton, lesbian, or bisexual orientation, Low socioeconomic status, Unemployed Current Mental Status:  NA Loss Factors:  NA Historical Factors:  NA Risk Reduction Factors:  Sense of responsibility to family, Positive social support, Positive therapeutic relationship Total Time spent with patient: 30 minutes Principal Problem: Schizoaffective disorder, bipolar type Diagnosis:   Patient Active Problem List   Diagnosis Date Noted  . Schizoaffective disorder, bipolar type [F25.0] 01/19/2015  . Tobacco use disorder, moderate, dependence [F17.200] 01/19/2015     Continued Clinical Symptoms:  Alcohol Use Disorder Identification Test Final Score (AUDIT): 2 The "Alcohol Use Disorders Identification Test", Guidelines for Use in Primary Care, Second Edition.  World Science writer [facility]). Score between 0-7:  no or low risk or alcohol related problems. Score between 8-15:  moderate risk of alcohol related problems. Score between 16-19:  high risk of alcohol related problems. Score 20 or above:  warrants further diagnostic evaluation for alcohol dependence and treatment.   CLINICAL FACTORS:   Previous Psychiatric Diagnoses and Treatments   Musculoskeletal: Strength & Muscle Tone: within normal limits Gait & Station: normal Patient leans: N/A  Psychiatric Specialty Exam: Physical Exam  Review of Systems  Psychiatric/Behavioral: Positive for hallucinations. The patient is nervous/anxious and has insomnia.   All other systems reviewed and are negative.   Blood pressure 123/77, pulse 75, temperature 98 F (36.7 C), temperature source Oral, resp. rate 16, height  (1.753 m), weight 87.544 kg (193 lb), SpO2 100 %.Body mass index is 28.49 kg/(m^2).  General Appearance: Fairly Groomed  Patent attorney::  Fair  Speech:  Pressured  Volume:  Normal  Mood:  Anxious  Affect:  Labile   Thought Process:  Disorganized, Irrelevant, Loose and Tangential  Orientation:  Full (Time, Place, and Person)  Thought Content:  Delusions, Hallucinations: Auditory and Rumination  Suicidal Thoughts:  No  Homicidal Thoughts:  No  Memory:  Immediate;   Fair Recent;   Fair Remote;   Fair  Judgement:  Poor  Insight:  Lacking  Psychomotor Activity:  Normal  Concentration:  Poor  Recall:  [employer] of Knowledge:Fair  Language: Fair  Akathisia:  No  Handed:  Right  AIMS (if indicated):     Assets:  Desire for Improvement  Sleep:     Cognition: WNL  ADL's:  Intact     COGNITIVE FEATURES THAT CONTRIBUTE TO RISK:  Closed-mindedness, Polarized thinking and Thought constriction (tunnel vision)    SUICIDE RISK:   Mild:  Suicidal ideation of limited frequency, intensity, duration, and specificity.  There are no identifiable plans, no associated intent, mild dysphoria and related symptoms, good self-control (both objective and subjective assessment), few other risk factors, and identifiable protective factors, including available and accessible social support.  PLAN OF CARE: Patient will benefit from inpatient treatment and stabilization.  Estimated length of stay is 5-7 days.  Reviewed past medical records,treatment plan.  Will increase Risperdal to 2 mg po qhs for psychosis/mood lability. Will continue Depakote DR 500 mg po bid for mood lability. Depakote level on 01/22/15. Add Trazodone 50 mg po qhs prn for sleep. Replace Kdur 20 meqx2 doses for hypokalemia, repeat BMP tomorrow AM. Continue HCTZ/Lisinopril as scheduled for HTN. Add nicotine patch for smoking cessation. Provided counseling. Pt with low HCT /HB- Will get Iron panel, will also get lipid panel,ekg,hba1c,tsh,bmp. Will continue to monitor vitals ,medication compliance and treatment side effects while patient is here.  Will monitor for medical issues as well as call consult as needed.  CSW will start working on  disposition.  Patient to participate in therapeutic milieu .       Medical Decision Making:  Review of Psycho-Social Stressors (1), Review or order clinical lab tests (1), Established Problem, Worsening (2), Review of Last Therapy Session (1), Review of Medication Regimen & Side Effects (2) and Review of New Medication or Change in Dosage (2)  I certify that inpatient services furnished can reasonably be expected to improve the patient's condition.   Steven Berke MD 01/19/2015, 1:16 PM
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
## 2015-01-19 NOTE — Progress Notes (Signed)
CSW attempting to contact Pt's mother for collateral and to complete SPE, however, number listed in the chart is not correct and Pt does not know the current number.   Chad CordialLauren Carter, Theresia MajorsLCSWA [phone]

## 2015-01-19 NOTE — Progress Notes (Signed)
D:Patient in the dayroom on approach.  Patient states he had a good day.  Patient tangential and disorganized.  Patient appeared preoccupied and laughs inappropriately.  Patient has to be redirected at times and has to be reminded even though he states he understands.  Patient denies SI/HI patient states he has auditory hallucinations.  Patient is religiously preoccupied as well. A: Staff to monitor Q 15 mins for safety.  Encouragement and support offered.  Scheduled medications administered per orders.  Ativan administered prn for anxiety. R: Patient remains safe on the unit.  Patient attended group tonight.  Patient visible on the unit.  Patient taking administered medications.

## 2015-01-19 NOTE — Progress Notes (Signed)
[facility] MD Progress Note  01/19/2015 1:23 PM Steven Clayton  MRN:  [id number] Subjective: Patient states " I am ok, I don't know how much people can cause you problems. I am trying to get my self together. I think the medications are helping.'  Objective:Patient seen and chart reviewed.Discussed patient with treatment team.  Pt is a 34 year old AA male who presented voluntarily to the [facility] with complaints of suicidal ideation and auditory hallucinations. The patient appeared manic and had great difficulty engaging in assessments due to flight of ideas and tangential speech.  Patient seen this AM. Patient continues to have flight of ideas, is tangential, loose thought process. Pt continues to appear manic. Discussed with patient about medication readjustments. Per staff patient has been compliant on all medications. Pt has had no disruptive issues noted on the unit.  Pt denies any new concerns. Pt denies any side effects of medications.   Principal Problem: Schizoaffective disorder, bipolar type, multiple episodes ,currently in acute episode. Diagnosis:   Patient Active Problem List   Diagnosis Date Noted  . Schizoaffective disorder, bipolar type [F25.0] 01/19/2015  . Tobacco use disorder, moderate, dependence [F17.200] 01/19/2015   Total Time spent with patient: 30 minutes   Past Medical History:  Past Medical History  Diagnosis Date  . Schizoaffective disorder    History reviewed. No pertinent past surgical history. Family History:  Family History  Problem Relation Age of Onset  . Mental illness Neg Hx    Social History:  History  Alcohol Use  . 0.6 oz/week  . 1 Glasses of wine per week     History  Drug Use No    History   Social History  . Marital Status: Single    Spouse Name: N/A  . Number of Children: N/A  . Years of Education: N/A   Social History Main Topics  . Smoking status: Current Some Day Smoker  . Smokeless tobacco: Never Used  . Alcohol Use: 0.6  oz/week    1 Glasses of wine per week  . Drug Use: No  . Sexual Activity: No   Other Topics Concern  . None   Social History Narrative   Additional History:    Sleep: Fair  Appetite:  Fair     Musculoskeletal: Strength & Muscle Tone: within normal limits Gait & Station: normal Patient leans: N/A   Psychiatric Specialty Exam: Physical Exam  Review of Systems  Psychiatric/Behavioral: The patient is nervous/anxious and has insomnia.   All other systems reviewed and are negative.   Blood pressure 123/77, pulse 75, temperature 98 F (36.7 C), temperature source Oral, resp. rate 16, height 5\' 9"  (1.753 m), weight 87.544 kg (193 lb), SpO2 100 %.Body mass index is 28.49 kg/(m^2).  General Appearance: Fairly Groomed  Patent attorney::  Fair  Speech:  Pressured  Volume:  Normal  Mood:  Euphoric  Affect:  Labile  Thought Process:  Disorganized, Irrelevant, Loose and Tangential  Orientation:  Full (Time, Place, and Person)  Thought Content:  Delusions, Hallucinations: Auditory and Rumination  Suicidal Thoughts:  No  Homicidal Thoughts:  No  Memory:  Immediate;   Fair Recent;   Fair Remote;   Fair  Judgement:  Poor  Insight:  Lacking  Psychomotor Activity:  Increased and Restlessness  Concentration:  Poor  Recall:  [employer] of Knowledge:Fair  Language: Fair  Akathisia:  No  Handed:  Right  AIMS (if indicated):     Assets:  Desire for Improvement  ADL's:  Intact  Cognition: WNL  Sleep:        Current Medications: Current Facility-Administered Medications  Medication Dose Route Frequency Provider Last Rate Last Dose  . acetaminophen (TYLENOL) tablet 650 mg  650 mg Oral Q6H PRN Charm RingsJamison Y Lord, NP      . acetaminophen (TYLENOL) tablet 650 mg  650 mg Oral Q4H PRN Charm RingsJamison Y Lord, NP      . alum & mag hydroxide-simeth (MAALOX/MYLANTA) 200-200-20 MG/5ML suspension 30 mL  30 mL Oral Q4H PRN Charm RingsJamison Y Lord, NP      . benztropine (COGENTIN) tablet 1 mg  1 mg Oral Daily  Charm RingsJamison Y Lord, NP   1 mg at 01/19/15 0759  . divalproex (DEPAKOTE ER) 24 hr tablet 500 mg  500 mg Oral BID Thermon LeylandLaura A Davis, NP   500 mg at 01/19/15 0759  . feeding supplement (ENSURE ENLIVE) (ENSURE ENLIVE) liquid 237 mL  237 mL Oral BID BM Willian Donson, MD   237 mL at 01/19/15 1045  . hydrochlorothiazide (MICROZIDE) capsule 12.5 mg  12.5 mg Oral Daily Charm RingsJamison Y Lord, NP   12.5 mg at 01/19/15 0759  . ibuprofen (ADVIL,MOTRIN) tablet 600 mg  600 mg Oral Q8H PRN Charm RingsJamison Y Lord, NP      . lisinopril (PRINIVIL,ZESTRIL) tablet 20 mg  20 mg Oral Daily Charm RingsJamison Y Lord, NP   20 mg at 01/19/15 0800  . LORazepam (ATIVAN) tablet 1 mg  1 mg Oral Q8H PRN Charm RingsJamison Y Lord, NP      . magnesium hydroxide (MILK OF MAGNESIA) suspension 30 mL  30 mL Oral Daily PRN Charm RingsJamison Y Lord, NP      . nicotine (NICODERM CQ - dosed in mg/24 hours) patch 14 mg  14 mg Transdermal Daily Charm RingsJamison Y Lord, NP   14 mg at 01/19/15 0801  . potassium chloride SA (K-DUR,KLOR-CON) CR tablet 20 mEq  20 mEq Oral BID Thermon LeylandLaura A Davis, NP   20 mEq at 01/19/15 0800  . risperiDONE (RISPERDAL) tablet 2 mg  2 mg Oral QHS Jomarie LongsSaramma Grey Schlauch, MD        Lab Results:  Results for orders placed or performed during the hospital encounter of 01/18/15 (from the past 48 hour(s))  Lipid panel     Status: None   Collection Time: 01/19/15  6:40 AM  Result Value Ref Range   Cholesterol 155 0 - 200 mg/dL   Triglycerides 161117 <096<150 mg/dL   HDL 41 >04>40 mg/dL   Total CHOL/HDL Ratio 3.8 RATIO   VLDL 23 0 - 40 mg/dL   LDL Cholesterol 91 0 - 99 mg/dL    Comment:        Total Cholesterol/HDL:CHD Risk Coronary Heart Disease Risk Table                     Men   Women  1/2 Average Risk   3.4   3.3  Average Risk       5.0   4.4  2 X Average Risk   9.6   7.1  3 X Average Risk  23.4   11.0        Use the calculated Patient Ratio above and the CHD Risk Table to determine the patient's CHD Risk.        ATP III CLASSIFICATION (LDL):  <100     mg/dL   Optimal  [id number]   mg/dL   Near or Above  Optimal  130-159  mg/dL   Borderline  213-086  mg/dL   High  >578     mg/dL   Very High Performed at [facility]   TSH     Status: None   Collection Time: 01/19/15  6:40 AM  Result Value Ref Range   TSH 1.896 0.350 - 4.500 uIU/mL    Comment: Performed at [facility]    Physical Findings: AIMS: Facial and Oral Movements Muscles of Facial Expression: None, normal Lips and Perioral Area: None, normal Jaw: None, normal Tongue: None, normal,Extremity Movements Upper (arms, wrists, hands, fingers): None, normal Lower (legs, knees, ankles, toes): None, normal, Trunk Movements Neck, shoulders, hips: None, normal, Overall Severity Severity of abnormal movements (highest score from questions above): None, normal Incapacitation due to abnormal movements: None, normal Patient's awareness of abnormal movements (rate only patient's report): No Awareness, Dental Status Current problems with teeth and/or dentures?: No Does patient usually wear dentures?: No  CIWA:    COWS:     [facility] Admission Suicide Risk Assessment   Nursing information obtained from: Patient Demographic factors: Male, Cardell Peach, lesbian, or bisexual orientation, Low socioeconomic status, Unemployed Current Mental Status: NA Loss Factors: NA Historical Factors: NA Risk Reduction Factors: Sense of responsibility to family, Positive social support, Positive therapeutic relationship Total Time spent with patient: 30 minutes Principal Problem: Schizoaffective disorder, bipolar type Diagnosis:  Patient Active Problem List   Diagnosis Date Noted  . Schizoaffective disorder, bipolar type [F25.0] 01/19/2015  . Tobacco use disorder, moderate, dependence [F17.200] 01/19/2015     Continued Clinical Symptoms:  Alcohol Use Disorder Identification Test Final Score (AUDIT): 2 The "Alcohol Use Disorders Identification Test", Guidelines for Use in  Primary Care, Second Edition. World Science writer [facility]). Score between 0-7: no or low risk or alcohol related problems. Score between 8-15: moderate risk of alcohol related problems. Score between 16-19: high risk of alcohol related problems. Score 20 or above: warrants further diagnostic evaluation for alcohol dependence and treatment.   CLINICAL FACTORS:  Previous Psychiatric Diagnoses and Treatments   Musculoskeletal: Strength & Muscle Tone: within normal limits Gait & Station: normal Patient leans: N/A  Psychiatric Specialty Exam: Physical Exam  Review of Systems  Psychiatric/Behavioral: Positive for hallucinations. The patient is nervous/anxious and has insomnia.  All other systems reviewed and are negative.   Blood pressure 123/77, pulse 75, temperature 98 F (36.7 C), temperature source Oral, resp. rate 16, height  (1.753 m), weight 87.544 kg (193 lb), SpO2 100 %.Body mass index is 28.49 kg/(m^2).  General Appearance: Fairly Groomed  Patent attorney:: Fair  Speech: Pressured  Volume: Normal  Mood: Anxious  Affect: Labile  Thought Process: Disorganized, Irrelevant, Loose and Tangential  Orientation: Full (Time, Place, and Person)  Thought Content: Delusions, Hallucinations: Auditory and Rumination  Suicidal Thoughts: No  Homicidal Thoughts: No  Memory: Immediate; Fair Recent; Fair Remote; Fair  Judgement: Poor  Insight: Lacking  Psychomotor Activity: Normal  Concentration: Poor  Recall: [employer] of Knowledge:Fair  Language: Fair  Akathisia: No  Handed: Right  AIMS (if indicated):    Assets: Desire for Improvement  Sleep:    Cognition: WNL  ADL's: Intact     COGNITIVE FEATURES THAT CONTRIBUTE TO RISK:  Closed-mindedness, Polarized thinking and Thought constriction (tunnel vision)   SUICIDE RISK:  Mild: Suicidal ideation of limited frequency, intensity, duration, and  specificity. There are no identifiable plans, no associated intent, mild dysphoria and related symptoms, good self-control (both objective and  subjective assessment), few other risk factors, and identifiable protective factors, including available and accessible social support.            Assessment: Patient is a 45 y old AAM with hx of schizoaffective disorder, presented with mania, delusions as well as AH. Pt continues to be disorganized, tangential. Pt will continue to need inpatient stay as well as treatment.   Treatment Plan Summary: Daily contact with patient to assess and evaluate symptoms and progress in treatment and Medication management PLAN OF CARE: Patient will benefit from inpatient treatment and stabilization.  Estimated length of stay is 5-7 days.  Reviewed past medical records,treatment plan.  Will increase Risperdal to 2 mg po qhs for psychosis/mood lability. Will continue Depakote DR 500 mg po bid for mood lability. Depakote level on 01/22/15. Add Trazodone 50 mg po qhs prn for sleep. Replace Kdur 20 meqx2 doses for hypokalemia, repeat BMP tomorrow AM. Continue HCTZ/Lisinopril as scheduled for HTN. Add nicotine patch for smoking cessation. Provided counseling. Pt with low HCT /HB- Will get Iron panel, will also get lipid panel,ekg,hba1c,tsh,bmp. Will continue to monitor vitals ,medication compliance and treatment side effects while patient is here.  Will monitor for medical issues as well as call consult as needed.  CSW will start working on disposition.  Patient to participate in therapeutic milieu .    Medical Decision Making:  Review of Psycho-Social Stressors (1), Review or order clinical lab tests (1), Established Problem, Worsening (2), Review of Last Therapy Session (1), Review or order medicine tests (1), Review of Medication Regimen & Side Effects (2) and Review of New Medication or Change in Dosage (2)     Sophiarose Eades MD 01/19/2015, 1:23  PM

## 2015-01-19 NOTE — [facility] Group Notes (Signed)
[facility] LCSW Group Therapy  01/19/2015 , 2:11 PM   Type of Therapy:  Group Therapy  Participation Level:  Active  Participation Quality:  Attentive  Affect:  Appropriate  Cognitive:  Alert  Insight:  Improving  Engagement in Therapy:  Engaged  Modes of Intervention:  Discussion, Exploration and Socialization  Summary of Progress/Problems: Today's group focused on the term Diagnosis.  Participants were asked to define the term, and then pronounce whether it is a negative, positive or neutral term.  Unfortunately, very verbal and willing to speak up during lulls in the conversation.  Flight of ideas, loose associations and pressured speech.  Can be difficult to cut off without being direct.  Steven Clayton, Steven Clayton 01/19/2015 , 2:11 PM

## 2015-01-19 NOTE — Progress Notes (Signed)
Patient ID: Daniel Noneshomas Mares, male   DOB: 03/23/1981, 34 y.o.   MRN: [id number] D: Patient continues to be manic and hyperverbal.  Has pressured speech, flight of ideas and loose associations. Denies SI, HI, AVH.  Attending groups, has a tendency to be unable to stop talking once he has started. Can be redirected.    A: Patient given emotional support from RN. Patient given medications per MD orders. Patient encouraged to continue attending groups and unit activities. Patient encouraged to come to staff with any questions or concerns.  R: Patient remains cooperative and appropriate. Will continue to monitor patient for safety.

## 2015-01-20 DIAGNOSIS — D509 Iron deficiency anemia, unspecified: Secondary | ICD-10-CM | POA: Clinically undetermined

## 2015-01-20 LAB — BASIC METABOLIC PANEL
Anion gap: 7 (ref 5–15)
BUN: 11 mg/dL (ref 6–20)
CALCIUM: 9.3 mg/dL (ref 8.9–10.3)
CO2: 27 mmol/L (ref 22–32)
Chloride: 105 mmol/L (ref 101–111)
Creatinine, Ser: 0.92 mg/dL (ref 0.61–1.24)
GFR calc Af Amer: >60 mL/min (ref >60)
GFR calc non Af Amer: >60 mL/min (ref >60)
Glucose, Bld: 83 mg/dL (ref 65–99)
Potassium: 4.2 mmol/L (ref 3.5–5.1)
Sodium: 139 mmol/L (ref 135–145)

## 2015-01-20 LAB — HEMOGLOBIN A1C
Hgb A1c MFr Bld: 5.4 % (ref 4.8–5.6)
Mean Plasma Glucose: 108 mg/dL

## 2015-01-20 LAB — LIPID PANEL
Cholesterol: 135 mg/dL (ref 0–200)
HDL: 37 mg/dL — AB (ref >40)
LDL CALC: 80 mg/dL (ref 0–99)
Total CHOL/HDL Ratio: 3.6 RATIO
Triglycerides: 90 mg/dL (ref <150)
VLDL: 18 mg/dL (ref 0–40)

## 2015-01-20 LAB — IRON AND TIBC
Iron: 33 ug/dL — ABNORMAL LOW (ref 45–182)
SATURATION RATIOS: 11 % — AB (ref 17.9–39.5)
TIBC: 297 ug/dL (ref 250–450)
UIBC: 264 ug/dL

## 2015-01-20 LAB — FERRITIN: Ferritin: 77 ng/mL (ref 24–336)

## 2015-01-20 MED ORDER — FERROUS SULFATE 325 (65 FE) MG PO TABS
325.0000 mg | ORAL_TABLET | Freq: Two times a day (BID) | ORAL | Status: DC
  Administered 2015-01-20 – 2015-01-25 (×10): 325 mg via ORAL
  Filled 2015-01-20: qty 1
  Filled 2015-01-20: qty 6
  Filled 2015-01-20 (×6): qty 1
  Filled 2015-01-20: qty 6
  Filled 2015-01-20 (×5): qty 1

## 2015-01-20 MED ORDER — RISPERIDONE 2 MG PO TABS
2.0000 mg | ORAL_TABLET | Freq: Every day | ORAL | Status: DC
  Administered 2015-01-20 – 2015-01-25 (×6): 2 mg via ORAL
  Filled 2015-01-20: qty 9
  Filled 2015-01-20 (×7): qty 1

## 2015-01-20 MED ORDER — BENZTROPINE MESYLATE 0.5 MG PO TABS
0.5000 mg | ORAL_TABLET | Freq: Every day | ORAL | Status: DC
  Administered 2015-01-21 – 2015-01-25 (×5): 0.5 mg via ORAL
  Filled 2015-01-20 (×4): qty 1
  Filled 2015-01-20: qty 6
  Filled 2015-01-20 (×2): qty 1

## 2015-01-20 MED ORDER — BENZTROPINE MESYLATE 0.5 MG PO TABS
0.5000 mg | ORAL_TABLET | Freq: Every day | ORAL | Status: DC
  Administered 2015-01-20 – 2015-01-24 (×5): 0.5 mg via ORAL
  Filled 2015-01-20 (×8): qty 1

## 2015-01-20 NOTE — Progress Notes (Signed)
D: Pt presents with congruent affect, anxious mood, rapid and tangential speech. Pt reported sleeping good last night, good appetite, normal energy and good concentration level on self inventory sheet. Pt rated his depression 5/10, hopelessness 5/10 and anxiety level 5/10. Pt's goal for today "healthy" plans is to meet his goal by being "discharge". Pt reported that his anxiety was related to not being able to reach his mother who is also his payee about paying his rent and phone bill, however, once he spoke with her he was ok and declined PRN when offered.   A: EKG done as ordered. Verbal education done on ordered medications prior to administration. Pt informed about scheduled lab in AM. Encouraged to voice concerns to staff. Support and availability offered. Q 15 minutes checks remains effective as ordered without behavioral issues to note at this time.   R: Pt denied SI, HI and pain when assessed. Pt did not attend scheduled groups when prompted by staff. Remains med compliant with scheduled medications. Safety maintained on and off unit. No adverse drug reaction reported or observed thus far.  Continue current plan of care.

## 2015-01-20 NOTE — [facility] Group Notes (Signed)
[facility]   01/20/2015 10:57 AM  Participation Quality:  Engaged  Mood/Affect:  Appropriate  Depression Rating:  denies  Anxiety Rating:  denies  Thoughts of Suicide:  No Will you contract for safety?   NA  Current AVH:  Denies  Plan for Discharge/Comments:  Mood is good.  States he slept well and is happy with his meds.  When asked about outpt provider, told a "tragic story" of [employer] fraud and services that were not delivered.  Sounded far-fetched, but then another pt validated his story by talking about the firing of someone related to transportation [employer] fraud.  No complaints nor concerns  Transportation Means: bus  Supports: family  Kiribatiorth, Thereasa DistanceRodney B

## 2015-01-20 NOTE — Progress Notes (Signed)
[facility] MD Progress Note  01/20/2015 3:06 PM Steven Clayton  MRN:  [id number] Subjective: Patient states " I am ok, I hear voices when people use profanity.'   Objective:Patient seen and chart reviewed.Discussed patient with treatment team.  Pt is a 34 year old AA male who presented voluntarily to the [facility] with complaints of suicidal ideation and auditory hallucinations. The patient appeared manic and had great difficulty engaging in assessments due to flight of ideas and tangential speech.  Patient seen this AM. Patient continues to be tangential , but more organized than yesterday. Pt continues to endorse AH - of profanity. However affect is improved , brighter today.Pt denies any new concerns today.  Per staff patient needed prn ativan 1 mg po to calm self down after being disruptive on the unit.    Principal Problem: Schizoaffective disorder, bipolar type, multiple episodes ,currently in acute episode. Diagnosis:   Patient Active Problem List   Diagnosis Date Noted  . Iron deficiency anemia [D50.9] 01/20/2015  . Schizoaffective disorder, bipolar type [F25.0] 01/19/2015  . Tobacco use disorder, moderate, dependence [F17.200] 01/19/2015   Total Time spent with patient: 30 minutes   Past Medical History:  Past Medical History  Diagnosis Date  . Schizoaffective disorder    History reviewed. No pertinent past surgical history. Family History:  Family History  Problem Relation Age of Onset  . Mental illness Neg Hx    Social History:  History  Alcohol Use  . 0.6 oz/week  . 1 Glasses of wine per week     History  Drug Use No    History   Social History  . Marital Status: Single    Spouse Name: N/A  . Number of Children: N/A  . Years of Education: N/A   Social History Main Topics  . Smoking status: Current Some Day Smoker  . Smokeless tobacco: Never Used  . Alcohol Use: 0.6 oz/week    1 Glasses of wine per week  . Drug Use: No  . Sexual Activity: No   Other Topics  Concern  . None   Social History Narrative   Additional History:    Sleep: Fair  Appetite:  Fair     Musculoskeletal: Strength & Muscle Tone: within normal limits Gait & Station: normal Patient leans: N/A   Psychiatric Specialty Exam: Physical Exam  Review of Systems  Psychiatric/Behavioral: Positive for hallucinations. The patient is nervous/anxious.   All other systems reviewed and are negative.   Blood pressure 125/82, pulse 81, temperature 97.4 F (36.3 C), temperature source Oral, resp. rate 20, height '5\' 9"'  (1.753 m), weight 87.544 kg (193 lb), SpO2 100 %.Body mass index is 28.49 kg/(m^2).  General Appearance: Fairly Groomed  Engineer, water::  Fair  Speech:  Pressured  Volume:  Normal  Mood:  Anxious  Affect:  Labile  Thought Process:  Disorganized, Irrelevant, Loose and Tangential  Orientation:  Full (Time, Place, and Person)  Thought Content:  Delusions, Hallucinations: Auditory and Rumination  Suicidal Thoughts:  No  Homicidal Thoughts:  No  Memory:  Immediate;   Fair Recent;   Fair Remote;   Fair  Judgement:  Poor  Insight:  Lacking  Psychomotor Activity:  Increased  Concentration:  Poor  Recall:  [place]  Language: Fair  Akathisia:  No  Handed:  Right  AIMS (if indicated):     Assets:  Desire for Improvement  ADL's:  Intact  Cognition: WNL  Sleep:  Current Medications: Current Facility-Administered Medications  Medication Dose Route Frequency Provider Last Rate Last Dose  . acetaminophen (TYLENOL) tablet 650 mg  650 mg Oral Q6H PRN Patrecia Pour, NP      . acetaminophen (TYLENOL) tablet 650 mg  650 mg Oral Q4H PRN Patrecia Pour, NP      . alum & mag hydroxide-simeth (MAALOX/MYLANTA) 200-200-20 MG/5ML suspension 30 mL  30 mL Oral Q4H PRN Patrecia Pour, NP      . Derrill Memo ON 01/21/2015] benztropine (COGENTIN) tablet 0.5 mg  0.5 mg Oral Daily Jenessa Gillingham, MD      . benztropine (COGENTIN) tablet 0.5 mg  0.5 mg Oral QHS  Taneshia Lorence, MD      . divalproex (DEPAKOTE ER) 24 hr tablet 500 mg  500 mg Oral BID Niel Hummer, NP   500 mg at 01/20/15 0834  . feeding supplement (ENSURE ENLIVE) (ENSURE ENLIVE) liquid 237 mL  237 mL Oral BID BM Treanna Dumler, MD   237 mL at 01/20/15 0940  . ferrous sulfate tablet 325 mg  325 mg Oral BID WC Keyanni Whittinghill, MD      . hydrochlorothiazide (MICROZIDE) capsule 12.5 mg  12.5 mg Oral Daily Patrecia Pour, NP   12.5 mg at 01/20/15 0834  . ibuprofen (ADVIL,MOTRIN) tablet 600 mg  600 mg Oral Q8H PRN Patrecia Pour, NP      . lisinopril (PRINIVIL,ZESTRIL) tablet 20 mg  20 mg Oral Daily Patrecia Pour, NP   20 mg at 01/20/15 0834  . magnesium hydroxide (MILK OF MAGNESIA) suspension 30 mL  30 mL Oral Daily PRN Patrecia Pour, NP      . nicotine (NICODERM CQ - dosed in mg/24 hours) patch 14 mg  14 mg Transdermal Daily Patrecia Pour, NP   14 mg at 01/20/15 0824  . risperiDONE (RISPERDAL) tablet 2 mg  2 mg Oral QHS Ursula Alert, MD   2 mg at 01/19/15 2158  . risperiDONE (RISPERDAL) tablet 2 mg  2 mg Oral Daily Ursula Alert, MD   2 mg at 01/20/15 1201    Lab Results:  Results for orders placed or performed during the hospital encounter of 01/18/15 (from the past 48 hour(s))  Lipid panel     Status: None   Collection Time: 01/19/15  6:40 AM  Result Value Ref Range   Cholesterol 155 0 - 200 mg/dL   Triglycerides 117 <150 mg/dL   HDL 41 >40 mg/dL   Total CHOL/HDL Ratio 3.8 RATIO   VLDL 23 0 - 40 mg/dL   LDL Cholesterol 91 0 - 99 mg/dL    Comment:        Total Cholesterol/HDL:CHD Risk Coronary Heart Disease Risk Table                     Men   Women  1/2 Average Risk   3.4   3.3  Average Risk       5.0   4.4  2 X Average Risk   9.6   7.1  3 X Average Risk  23.4   11.0        Use the calculated Patient Ratio above and the CHD Risk Table to determine the patient's CHD Risk.        ATP III CLASSIFICATION (LDL):  <100     mg/dL   Optimal  100-129  mg/dL   Near or Above  Optimal  130-159  mg/dL   Borderline  160-189  mg/dL   High  >190     mg/dL   Very High Performed at [facility]   Hemoglobin A1c     Status: None   Collection Time: 01/19/15  6:40 AM  Result Value Ref Range   Hgb A1c MFr Bld 5.4 4.8 - 5.6 %    Comment: (NOTE)         Pre-diabetes: 5.7 - 6.4         Diabetes: >6.4         Glycemic control for adults with diabetes: <7.0    Mean Plasma Glucose 108 mg/dL    Comment: (NOTE) Performed At: [facility] [place], [place] [id number] Lindon Romp MD IR:[id number] Performed at [facility]   TSH     Status: None   Collection Time: 01/19/15  6:40 AM  Result Value Ref Range   TSH 1.896 0.350 - 4.500 uIU/mL    Comment: Performed at [facility]  Lipid panel     Status: Abnormal   Collection Time: 01/20/15  6:26 AM  Result Value Ref Range   Cholesterol 135 0 - 200 mg/dL   Triglycerides 90 <150 mg/dL   HDL 37 (L) >40 mg/dL   Total CHOL/HDL Ratio 3.6 RATIO   VLDL 18 0 - 40 mg/dL   LDL Cholesterol 80 0 - 99 mg/dL    Comment:        Total Cholesterol/HDL:CHD Risk Coronary Heart Disease Risk Table                     Men   Women  1/2 Average Risk   3.4   3.3  Average Risk       5.0   4.4  2 X Average Risk   9.6   7.1  3 X Average Risk  23.4   11.0        Use the calculated Patient Ratio above and the CHD Risk Table to determine the patient's CHD Risk.        ATP III CLASSIFICATION (LDL):  <100     mg/dL   Optimal  100-129  mg/dL   Near or Above                    Optimal  130-159  mg/dL   Borderline  160-189  mg/dL   High  >190     mg/dL   Very High Performed at [employer] metabolic panel     Status: None   Collection Time: 01/20/15  6:26 AM  Result Value Ref Range   Sodium 139 135 - 145 mmol/L   Potassium 4.2 3.5 - 5.1 mmol/L   Chloride 105 101 - 111 mmol/L   CO2 27 22 - 32 mmol/L   Glucose, Bld 83 65 - 99 mg/dL    BUN 11 6 - 20 mg/dL   Creatinine, Ser 0.92 0.61 - 1.24 mg/dL   Calcium 9.3 8.9 - 10.3 mg/dL   GFR calc non Af Amer >60 >60 mL/min   GFR calc Af Amer >60 >60 mL/min    Comment: (NOTE) The eGFR has been calculated using the CKD EPI equation. This calculation has not been validated in all clinical situations. eGFR's persistently <60 mL/min signify possible Chronic Kidney Disease.    Anion gap 7 5 - 15    Comment: Performed at [facility]  Iron and TIBC     Status: Abnormal   Collection Time: 01/20/15  6:26 AM  Result Value Ref Range   Iron 33 (L) 45 - 182 ug/dL   TIBC 297 250 - 450 ug/dL   Saturation Ratios 11 (L) 17.9 - 39.5 %   UIBC 264 ug/dL    Comment: Performed at [facility]  Ferritin     Status: None   Collection Time: 01/20/15  6:26 AM  Result Value Ref Range   Ferritin 77 24 - 336 ng/mL    Comment: Performed at [facility]    Physical Findings: AIMS: Facial and Oral Movements Muscles of Facial Expression: None, normal Lips and Perioral Area: None, normal Jaw: None, normal Tongue: None, normal,Extremity Movements Upper (arms, wrists, hands, fingers): None, normal Lower (legs, knees, ankles, toes): None, normal, Trunk Movements Neck, shoulders, hips: None, normal, Overall Severity Severity of abnormal movements (highest score from questions above): None, normal Incapacitation due to abnormal movements: None, normal Patient's awareness of abnormal movements (rate only patient's report): No Awareness, Dental Status Current problems with teeth and/or dentures?: No Does patient usually wear dentures?: No  CIWA:    COWS:          Assessment: Patient is a 3 y old AAM with hx of schizoaffective disorder, presented with mania, delusions as well as AH. Pt continues to be disorganized, tangential. Pt will continue to need inpatient stay as well as treatment.   Treatment Plan Summary: Daily contact with patient to assess and evaluate  symptoms and progress in treatment and Medication management  Will increase Risperdal to 2 mg po bid  for psychosis/mood lability. Will change Cogentin to 0.5 mg po bid for EPS. Will continue Depakote DR 500 mg po bid for mood lability. Depakote level on 01/22/15. Will continue Trazodone 50 mg po qhs prn for sleep.  Hypokalemia -resolved . BMP reviewed today. Will start Feso4 325 mg po bid for iron deficiency anemia. Continue HCTZ/Lisinopril as scheduled for HTN. Continue nicotine patch for smoking cessation. Provided counseling.  Will continue to monitor vitals ,medication compliance and treatment side effects while patient is here.  Will monitor for medical issues as well as call consult as needed.  CSW will start working on disposition.  Patient to participate in therapeutic milieu .    Medical Decision Making:  Review of Psycho-Social Stressors (1), Review or order clinical lab tests (1), Established Problem, Worsening (2), Review of Last Therapy Session (1), Review or order medicine tests (1), Review of Medication Regimen & Side Effects (2) and Review of New Medication or Change in Dosage (2)     Aryanne Gilleland MD 01/20/2015, 3:06 PM

## 2015-01-20 NOTE — Plan of Care (Signed)
Problem: Alteration in thought process Goal: STG-Patient is able to discuss thoughts with staff Outcome: Progressing Pt did verbalized feeling anxious to staff this shift related to his rent and telephone bill due date by 6-3-5. Stated he felt better once he was able to speak with his mother and have it resolved. Denied SI, contracts for safety.

## 2015-01-20 NOTE — [facility] Group Notes (Signed)
[facility] LCSW Group Therapy  01/20/2015 3:28 PM   Type of Therapy:  Group Therapy  Participation Level:  Invited.  Chose to not attend    Summary of Progress/Problems: Today's group focused on relapse prevention.  We defined the term, and then brainstormed on ways to prevent relapse.  Steven Clayton, Steven Clayton 01/20/2015 , 3:28 PM

## 2015-01-20 NOTE — Progress Notes (Signed)
D: Patient in the dayroom on approach.  Patient speech rapid and disorganized but cooperative.  Patient mentions a dentist frequently in his speech but it does not have anything to do with what he and Clinical research associatewriter are talking about.  Patient denies SI/HI.  Patient religiously preoccupied and states he does have auditory hallucinations.  A: Staff to monitor Q 15 mins for safety.  Encouragement and support offered.  Scheduled medications administered per orders. R: Patient remains safe on the unit.  Patient attended group tonight.  Patient visible on the unit and interacting with peers.  Patient taking administered medications.

## 2015-01-20 NOTE — Tx Team (Signed)
Interdisciplinary Treatment Plan Update (Adult)  Date:  01/20/2015   Time Reviewed:  11:05 AM   Progress in Treatment: Attending groups: Yes. Participating in groups:  Yes. Taking medication as prescribed:  Yes. Tolerating medication:  Yes. Family/Significant othe contact made:  No Patient understands diagnosis:  Yes  As evidenced by seeking help with SI, AH. Discussing patient identified problems/goals with staff:  Yes, see initial care plan. Medical problems stabilized or resolved:  Yes. Denies suicidal/homicidal ideation: Yes. Issues/concerns per patient self-inventory:  No. Other:  New problem(s) identified:  Discharge Plan or Barriers: return home, follow up outpt  Reason for Continuation of Hospitalization: Mania Medication stabilization  Comments:  " I am ok, I don't know how much people can cause you problems. I am trying to get my self together. I think the medications are helping.' Pt is a 34 year old AA male [facility] suicidal ideation and auditory hallucinations. The patient appeared manic and had great difficulty engaging in assessments due to flight of ideas and tangential speech. Patient seen this AM. Patient continues to have flight of ideas, is tangential, loose thought process. Pt continues to appear manic. Discussed with patient about medication readjustments. Per staff patient has been compliant on all medications. Pt has had no disruptive issues noted on the unit. Risperdal, Depakote trial  Estimated length of stay: 4-5 days  New goal(s):  Review of initial/current patient goals per problem list:     Attendees: Patient:  01/20/2015 11:05 AM   Family:   01/20/2015 11:05 AM   Physician:  Jomarie LongsSaramma Eappen, MD 01/20/2015 11:05 AM   Nursing:   Earl ManySara Twyman, RN 01/20/2015 11:05 AM   CSW:    Daryel Geraldodney Kalup Jaquith, LCSW   01/20/2015 11:05 AM   Other:  01/20/2015 11:05 AM   Other:   01/20/2015 11:05 AM   Other:  Onnie BoerJennifer Clark, Nurse CM  01/20/2015 11:05 AM   Other:  Leisa LenzValerie Enoch, [facility] 01/20/2015 11:05 AM   Other:  Tomasita Morrowelora Sutton, P4CC  01/20/2015 11:05 AM   Other:  01/20/2015 11:05 AM   Other:  01/20/2015 11:05 AM   Other:  01/20/2015 11:05 AM   Other:  01/20/2015 11:05 AM   Other:  01/20/2015 11:05 AM   Other:   01/20/2015 11:05 AM    Scribe for Treatment Team:   Ida RogueNorth, Kniyah Khun B, 01/20/2015 11:05 AM

## 2015-01-21 LAB — HEMOGLOBIN A1C
HEMOGLOBIN A1C: 5.6 % (ref 4.8–5.6)
Mean Plasma Glucose: 114 mg/dL

## 2015-01-21 LAB — PROLACTIN: Prolactin: 26.5 ng/mL — ABNORMAL HIGH (ref 4.0–15.2)

## 2015-01-21 MED ORDER — RISPERIDONE 3 MG PO TABS
3.0000 mg | ORAL_TABLET | Freq: Every day | ORAL | Status: DC
  Administered 2015-01-21: 3 mg via ORAL
  Filled 2015-01-21 (×2): qty 1

## 2015-01-21 NOTE — Progress Notes (Signed)
D: Patien in his room on approach.  Patient states he had a good day today.  Patient states he possibly can go home tomorrow.  Patient states he wanted time to get everything under control first.  Patient states he was worried about his bills but his mother took care of it for him.  Patient denies SI/HI but states he has auditory hallucinations.  Patient verbally contracts for safety. A: Staff to monitor Q 15 mins for safety.  Encouragement and support offered.  Scheduled medications administered per orders. R: Patient remains safe on the unit.  Patient attended group tonight.  Patient visible on the unit and interacting with peers.

## 2015-01-21 NOTE — [facility] Group Notes (Signed)
[facility] Group Notes:  (Nursing-Leisure and Lifestyle Changes)  Date:  01/21/2015  Time:  0930  Type of Therapy:  Psychoeducational Skills  Participation Level:  Active  Participation Quality:  Intrusive and Redirectable  Affect:  Appropriate  Cognitive:  Limited, disorganized  Insight:  Limited  Engagement in Group:  Engaged and Limited  Modes of Intervention:  Discussion, Education, Orientation and Support  Summary of Progress/Problems:  Steven Clayton, Steven Clayton 01/21/2015, 0930

## 2015-01-21 NOTE — [facility] Group Notes (Signed)
[facility] Group Notes:  (Counselor/Nursing/MHT/Case Management/Adjunct)  01/21/2015 1:15PM  Type of Therapy:  Group Therapy  Participation Level:  Active  Participation Quality:  Appropriate  Affect:  Flat  Cognitive:  Oriented  Insight:  Improving  Engagement in Group:  Limited  Engagement in Therapy:  Limited  Modes of Intervention:  Discussion, Exploration and Socialization  Summary of Progress/Problems: The topic for group was balance in life.  Pt participated in the discussion about when their life was in balance and out of balance and how this feels.  Pt discussed ways to get back in balance and short term goals they can work on to get where they want to be.  Continues with somewhat pressured speech once he gets started, and tangential in nature, but no evidence of flight of ideas today.  He talked about feeling balanced today, and he knows this because he is paying attention to what is going on rather than just drifting.  Explained that having equal parts money to take care of bills and medication to take care of symptoms are also crucial.  Told us about playing violin from an early age, and how when he picks it up takes him to a good place, makes him feel balanced.   Daryel Geraldorth, Vennela Jutte B 01/21/2015 2:24 PM

## 2015-01-21 NOTE — Progress Notes (Signed)
[facility] MD Progress Note  01/21/2015 2:33 PM Steven Clayton  MRN:  [id number] Subjective: Patient states " I am ok, I had a good visit with my mother , she paid my bills and my rent and I feel less anxious now."    Objective:Patient seen and chart reviewed.Discussed patient with treatment team.  Pt is a 34 year old AA male who presented voluntarily to the [facility] with complaints of suicidal ideation and auditory hallucinations. The patient appeared manic and had great difficulty engaging in assessments due to flight of ideas and tangential speech.  Patient seen this AM. Patient today is more organized than the previous days. Pt with pressured speech , mostly discussing her childhood and her father who was convicted for murder and is in prison. Pt denies any AH today to writer , but per staff - pt with AH last night .Marland Kitchen Reports sleep and appetite as improving. Pt denies any new concerns today.  Per staff - no disruptive issues noted on the unit.    Principal Problem: Schizoaffective disorder, bipolar type, multiple episodes ,currently in acute episode. Diagnosis:   Patient Active Problem List   Diagnosis Date Noted  . Iron deficiency anemia [D50.9] 01/20/2015  . Schizoaffective disorder, bipolar type [F25.0] 01/19/2015  . Tobacco use disorder, moderate, dependence [F17.200] 01/19/2015   Total Time spent with patient: 30 minutes   Past Medical History:  Past Medical History  Diagnosis Date  . Schizoaffective disorder    History reviewed. No pertinent past surgical history. Family History:  Family History  Problem Relation Age of Onset  . Mental illness Neg Hx    Social History:  History  Alcohol Use  . 0.6 oz/week  . 1 Glasses of wine per week     History  Drug Use No    History   Social History  . Marital Status: Single    Spouse Name: N/A  . Number of Children: N/A  . Years of Education: N/A   Social History Main Topics  . Smoking status: Current Some Day Smoker  .  Smokeless tobacco: Never Used  . Alcohol Use: 0.6 oz/week    1 Glasses of wine per week  . Drug Use: No  . Sexual Activity: No   Other Topics Concern  . None   Social History Narrative   Additional History:    Sleep: Fair  Appetite:  Fair     Musculoskeletal: Strength & Muscle Tone: within normal limits Gait & Station: normal Patient leans: N/A   Psychiatric Specialty Exam: Physical Exam  Review of Systems  Psychiatric/Behavioral: The patient is nervous/anxious.   All other systems reviewed and are negative.   Blood pressure 126/86, pulse 89, temperature 98.1 F (36.7 C), temperature source Oral, resp. rate 18, height '5\' 9"'  (1.753 m), weight 87.544 kg (193 lb), SpO2 100 %.Body mass index is 28.49 kg/(m^2).  General Appearance: Fairly Groomed  Engineer, water::  Fair  Speech:  Pressured  Volume:  Normal  Mood:  Anxious  Affect:  Congruent  Thought Process:  Tangential  Orientation:  Full (Time, Place, and Person)  Thought Content:  Rumination  Suicidal Thoughts:  No  Homicidal Thoughts:  No  Memory:  Immediate;   Fair Recent;   Fair Remote;   Fair  Judgement:  Fair  Insight:  Fair  Psychomotor Activity:  Normal  Concentration:  Fair  Recall:  [employer] of Knowledge:Fair  Language: Fair  Akathisia:  No  Handed:  Right  AIMS (if indicated):  Assets:  Desire for Improvement  ADL's:  Intact  Cognition: WNL  Sleep:  Number of Hours: 6     Current Medications: Current Facility-Administered Medications  Medication Dose Route Frequency Provider Last Rate Last Dose  . acetaminophen (TYLENOL) tablet 650 mg  650 mg Oral Q6H PRN Patrecia Pour, NP   650 mg at 01/21/15 8592  . acetaminophen (TYLENOL) tablet 650 mg  650 mg Oral Q4H PRN Patrecia Pour, NP      . alum & mag hydroxide-simeth (MAALOX/MYLANTA) 200-200-20 MG/5ML suspension 30 mL  30 mL Oral Q4H PRN Patrecia Pour, NP      . benztropine (COGENTIN) tablet 0.5 mg  0.5 mg Oral Daily Ursula Alert, MD    0.5 mg at 01/21/15 0808  . benztropine (COGENTIN) tablet 0.5 mg  0.5 mg Oral QHS Ursula Alert, MD   0.5 mg at 01/20/15 2122  . divalproex (DEPAKOTE ER) 24 hr tablet 500 mg  500 mg Oral BID Niel Hummer, NP   500 mg at 01/21/15 9244  . feeding supplement (ENSURE ENLIVE) (ENSURE ENLIVE) liquid 237 mL  237 mL Oral BID BM Noreen Mackintosh, MD   237 mL at 01/21/15 0930  . ferrous sulfate tablet 325 mg  325 mg Oral BID WC Ursula Alert, MD   325 mg at 01/21/15 0808  . hydrochlorothiazide (MICROZIDE) capsule 12.5 mg  12.5 mg Oral Daily Patrecia Pour, NP   12.5 mg at 01/21/15 6286  . ibuprofen (ADVIL,MOTRIN) tablet 600 mg  600 mg Oral Q8H PRN Patrecia Pour, NP      . lisinopril (PRINIVIL,ZESTRIL) tablet 20 mg  20 mg Oral Daily Patrecia Pour, NP   20 mg at 01/21/15 3817  . magnesium hydroxide (MILK OF MAGNESIA) suspension 30 mL  30 mL Oral Daily PRN Patrecia Pour, NP      . nicotine (NICODERM CQ - dosed in mg/24 hours) patch 14 mg  14 mg Transdermal Daily Patrecia Pour, NP   14 mg at 01/21/15 0810  . risperiDONE (RISPERDAL) tablet 2 mg  2 mg Oral Daily Ursula Alert, MD   2 mg at 01/21/15 0807  . risperiDONE (RISPERDAL) tablet 3 mg  3 mg Oral QHS Ursula Alert, MD        Lab Results:  Results for orders placed or performed during the hospital encounter of 01/18/15 (from the past 48 hour(s))  Lipid panel     Status: Abnormal   Collection Time: 01/20/15  6:26 AM  Result Value Ref Range   Cholesterol 135 0 - 200 mg/dL   Triglycerides 90 <150 mg/dL   HDL 37 (L) >40 mg/dL   Total CHOL/HDL Ratio 3.6 RATIO   VLDL 18 0 - 40 mg/dL   LDL Cholesterol 80 0 - 99 mg/dL    Comment:        Total Cholesterol/HDL:CHD Risk Coronary Heart Disease Risk Table                     Men   Women  1/2 Average Risk   3.4   3.3  Average Risk       5.0   4.4  2 X Average Risk   9.6   7.1  3 X Average Risk  23.4   11.0        Use the calculated Patient Ratio above and the CHD Risk Table to determine the  patient's CHD Risk.  ATP III CLASSIFICATION (LDL):  <100     mg/dL   Optimal  100-129  mg/dL   Near or Above                    Optimal  130-159  mg/dL   Borderline  160-189  mg/dL   High  >190     mg/dL   Very High Performed at [facility]   Hemoglobin A1c     Status: None   Collection Time: 01/20/15  6:26 AM  Result Value Ref Range   Hgb A1c MFr Bld 5.6 4.8 - 5.6 %    Comment: (NOTE)         Pre-diabetes: 5.7 - 6.4         Diabetes: >6.4         Glycemic control for adults with diabetes: <7.0    Mean Plasma Glucose 114 mg/dL    Comment: (NOTE) Performed At: [facility] [place], [place] [id number] Lindon Romp MD [id number] Performed at [facility]   Prolactin     Status: Abnormal   Collection Time: 01/20/15  6:26 AM  Result Value Ref Range   Prolactin 26.5 (H) 4.0 - 15.2 ng/mL    Comment: (NOTE) Performed At: [facility] [place], [place] [id number] Lindon Romp MD [id number] Performed at [facility] metabolic panel     Status: None   Collection Time: 01/20/15  6:26 AM  Result Value Ref Range   Sodium 139 135 - 145 mmol/L   Potassium 4.2 3.5 - 5.1 mmol/L   Chloride 105 101 - 111 mmol/L   CO2 27 22 - 32 mmol/L   Glucose, Bld 83 65 - 99 mg/dL   BUN 11 6 - 20 mg/dL   Creatinine, Ser 0.92 0.61 - 1.24 mg/dL   Calcium 9.3 8.9 - 10.3 mg/dL   GFR calc non Af Amer >60 >60 mL/min   GFR calc Af Amer >60 >60 mL/min    Comment: (NOTE) The eGFR has been calculated using the CKD EPI equation. This calculation has not been validated in all clinical situations. eGFR's persistently <60 mL/min signify possible Chronic Kidney Disease.    Anion gap 7 5 - 15    Comment: Performed at [facility]  Iron and TIBC     Status: Abnormal   Collection Time: 01/20/15  6:26 AM  Result Value Ref Range   Iron 33 (L) 45 - 182 ug/dL   TIBC 297 250 -  450 ug/dL   Saturation Ratios 11 (L) 17.9 - 39.5 %   UIBC 264 ug/dL    Comment: Performed at [name]  Ferritin     Status: None   Collection Time: 01/20/15  6:26 AM  Result Value Ref Range   Ferritin 77 24 - 336 ng/mL    Comment: Performed at [facility]    Physical Findings: AIMS: Facial and Oral Movements Muscles of Facial Expression: None, normal Lips and Perioral Area: None, normal Jaw: None, normal Tongue: None, normal,Extremity Movements Upper (arms, wrists, hands, fingers): None, normal Lower (legs, knees, ankles, toes): None, normal, Trunk Movements Neck, shoulders, hips: None, normal, Overall Severity Severity of abnormal movements (highest score from questions above): None, normal Incapacitation due to abnormal movements: None, normal Patient's awareness of abnormal movements (rate only patient's report): No Awareness, Dental Status Current problems with teeth and/or dentures?: No Does patient usually  wear dentures?: No  CIWA:    COWS:          Assessment: Patient is a 66 y old AAM with hx of schizoaffective disorder, presented with mania, delusions as well as AH. Pt today is more organized , less tangential , continues to have pressured speech. Pt will continue to need inpatient stay as well as treatment.   Treatment Plan Summary: Daily contact with patient to assess and evaluate symptoms and progress in treatment and Medication management  Will increase Risperdal to 5 mg po daily for psychosis/mood lability. Will continue Cogentin  0.5 mg po bid for EPS. Will continue Depakote DR 500 mg po bid for mood lability. Depakote level on 01/22/15. Will continue Trazodone 50 mg po qhs prn for sleep.   Will continue Feso4 325 mg po bid for iron deficiency anemia. Continue HCTZ/Lisinopril as scheduled for HTN. Continue nicotine patch for smoking cessation. Provided counseling.  Will continue to monitor vitals ,medication compliance and treatment  side effects while patient is here.  Will monitor for medical issues as well as call consult as needed.  CSW will start working on disposition.  Patient to participate in therapeutic milieu .    Medical Decision Making:  Review of Psycho-Social Stressors (1), Review or order clinical lab tests (1), Established Problem, Worsening (2), Review of Last Therapy Session (1), Review of Medication Regimen & Side Effects (2) and Review of New Medication or Change in Dosage (2)     Luda Charbonneau MD 01/21/2015, 2:33 PM

## 2015-01-21 NOTE — Progress Notes (Signed)
D: Steven Clayton was polite and cooperative upon approach when this writer assumed care at 1500.  In his self-inventory, pt reported having good sleep and appetite. He rated his depression a 5, feelings of hopelessness a 5, and anxiety a 5. He complained of feeling lightheaded and having knee as well as back pain. He reported he would achieve a goal of "staying health" by taking his medications.  A: Med given as ordered. Q15 safety checks maintained. Support/encouragement offered. R: Pt remains free from harm and proceeds with treatment. Will monitor for needs/safety.

## 2015-01-21 NOTE — Plan of Care (Signed)
Problem: Alteration in thought process Goal: LTG-Patient behavior demonstrates decreased signs psychosis Darel presents as tangential with flight of ideas, pressured speech. Goal not met. R north LCSW 01/20/2015 11:11 AM  Outcome: Progressing [place] is less tangential tonight and behavior appropriate on the unit.   Goal: LTG-Patient verbalizes understanding importance med regimen (Patient verbalizes understanding of importance of medication regimen and need to continue outpatient care.)  Outcome: Progressing Patient taking all prescribed medications. Goal: STG-Patient is able to follow short directions Outcome: Completed/Met Date Met:  01/21/15 Patient pleasant and cooperative.  Patient following short instructions and rules on the unit. Goal: STG-Patient is able to sleep at least 6 hours per night Outcome: Completed/Met Date Met:  01/21/15 Patient has been able to sleep 6 hours consistently on this admission  Problem: Diagnosis: Increased Risk For Suicide Attempt Goal: LTG-Patient Will Report Improved Mood and Deny Suicidal LTG (by discharge) Patient will report improved mood and deny suicidal ideation.  Outcome: Progressing Patient denies SI.  Patient states he will notify states he he feels SI and he feels he with harm self.

## 2015-01-21 NOTE — [facility] Suicide Risk Assessment (Signed)
[facility] INPATIENT:  Family/Significant Other Suicide Prevention Education  Suicide Prevention Education:  Education Completed; Ms Mercy Ridingburgess, mother, [phone]  has been identified by the patient as the family member/significant other with whom the patient will be residing, and identified as the person(s) who will aid the patient in the event of a mental health crisis (suicidal ideations/suicide attempt).  With written consent from the patient, the family member/significant other has been provided the following suicide prevention education, prior to the and/or following the discharge of the patient.  The suicide prevention education provided includes the following:  Suicide risk factors  Suicide prevention and interventions  National Suicide Hotline telephone number  [facility] assessment telephone number  [facility] Assistance 911  [name] and/or Residential Mobile Crisis Unit telephone number  Request made of family/significant other to:  Remove weapons (e.g., guns, rifles, knives), all items previously/currently identified as safety concern.    Remove drugs/medications (over-the-counter, prescriptions, illicit drugs), all items previously/currently identified as a safety concern.  The family member/significant other verbalizes understanding of the suicide prevention education information provided.  The family member/significant other agrees to remove the items of safety concern listed above.  Daryel Geraldorth, Haru Shaff B 01/21/2015, 5:09 PM

## 2015-01-22 LAB — VALPROIC ACID LEVEL: Valproic Acid Lvl: 80 ug/mL (ref 50.0–100.0)

## 2015-01-22 MED ORDER — RISPERIDONE 2 MG PO TABS
4.0000 mg | ORAL_TABLET | Freq: Every day | ORAL | Status: DC
  Administered 2015-01-22 – 2015-01-24 (×3): 4 mg via ORAL
  Filled 2015-01-22 (×6): qty 2

## 2015-01-22 NOTE — Progress Notes (Addendum)
D: Per self inventory form pt reports he slept fair last night with the use of sleep medication. He reports a good appetite and normal energy level. He reports his depression 5/10, hopelessness 5/10, and anxiety 5/10 all on 1-10 scale, 10 being the worse.  "I am a schizophrenic, that is part of my illness." He reports passive SI "I would never hurt myself, but I am a schizophrenic, I have those thoughts." Behavior calm and cooperative. Pleasant on approach. He denies physical pain. Positive for auditory hallucinations, but reports that is his normal "I am a schizophrenic, this is my normal." Reports auditory hallucinations are not bothering him "They are not yelling profanities at me."   A. Pt remains on special checks q 15mins for safety. Medication administered per MD order. Encouragement provided  R: Individual verbally contract for safety. Compliant with medication regimen. Safety maintained. Positive coping mechanisms noted.

## 2015-01-22 NOTE — Progress Notes (Signed)
[facility] MD Progress Note  01/22/2015 12:03 PM Steven Clayton  MRN:  [id number] Subjective: Patient states " I am ok, I do not have any concerns today.'     Objective:Patient seen and chart reviewed.Discussed patient with treatment team.  Pt is a 34 year old AA male who presented voluntarily to the [facility] with complaints of suicidal ideation and auditory hallucinations. The patient appeared manic and had great difficulty engaging in assessments due to flight of ideas and tangential speech on admission day.  Patient seen this AM. Patient today continues to have pressured speech , tangential thought process. However he has been responding to medications well. He is better organized than previous days. Tolerating medications well, denies any side effects. Pt when asked about psychosis - talks about voices that he hear when people use profanity - could not explain this further . Per staff - pt continues to need redirection , due to impulsivity, intrusive behavior as well as pressured speech. Usually in group activities - pt needs a lot of support and redirection.  Per staff - no disruptive issues noted on the unit.    Principal Problem: Schizoaffective disorder, bipolar type, multiple episodes ,currently in acute episode. Diagnosis:   Patient Active Problem List   Diagnosis Date Noted  . Iron deficiency anemia [D50.9] 01/20/2015  . Schizoaffective disorder, bipolar type [F25.0] 01/19/2015  . Tobacco use disorder, moderate, dependence [F17.200] 01/19/2015   Total Time spent with patient: 30 minutes   Past Medical History:  Past Medical History  Diagnosis Date  . Schizoaffective disorder    History reviewed. No pertinent past surgical history. Family History:  Family History  Problem Relation Age of Onset  . Mental illness Neg Hx    Social History:  History  Alcohol Use  . 0.6 oz/week  . 1 Glasses of wine per week     History  Drug Use No    History   Social History  . Marital  Status: Single    Spouse Name: N/A  . Number of Children: N/A  . Years of Education: N/A   Social History Main Topics  . Smoking status: Current Some Day Smoker  . Smokeless tobacco: Never Used  . Alcohol Use: 0.6 oz/week    1 Glasses of wine per week  . Drug Use: No  . Sexual Activity: No   Other Topics Concern  . None   Social History Narrative   Additional History:    Sleep: Fair  Appetite:  Fair     Musculoskeletal: Strength & Muscle Tone: within normal limits Gait & Station: normal Patient leans: N/A   Psychiatric Specialty Exam: Physical Exam  Review of Systems  Psychiatric/Behavioral: Positive for hallucinations. The patient is nervous/anxious.   All other systems reviewed and are negative.   Blood pressure 109/72, pulse 94, temperature 97.8 F (36.6 C), temperature source Oral, resp. rate 17, height  (1.753 m), weight 87.544 kg (193 lb), SpO2 100 %.Body mass index is 28.49 kg/(m^2).  General Appearance: Fairly Groomed  Patent attorney::  Fair  Speech:  Pressured  Volume:  Normal  Mood:  Anxious  Affect:  Congruent  Thought Process:  Tangential  Orientation:  Full (Time, Place, and Person)  Thought Content:  Hallucinations: Auditory and Rumination  Suicidal Thoughts:  No  Homicidal Thoughts:  No  Memory:  Immediate;   Fair Recent;   Fair Remote;   Fair  Judgement:  Fair  Insight:  Fair  Psychomotor Activity:  Normal  Concentration:  Fair  Recall:  [employer] of Knowledge:Fair  Language: Fair  Akathisia:  No  Handed:  Right  AIMS (if indicated):     Assets:  Desire for Improvement  ADL's:  Intact  Cognition: WNL  Sleep:  Number of Hours: 6.5     Current Medications: Current Facility-Administered Medications  Medication Dose Route Frequency Provider Last Rate Last Dose  . acetaminophen (TYLENOL) tablet 650 mg  650 mg Oral Q6H PRN Charm Rings, NP   650 mg at 01/21/15 1610  . acetaminophen (TYLENOL) tablet 650 mg  650 mg Oral Q4H PRN  Charm Rings, NP      . alum & mag hydroxide-simeth (MAALOX/MYLANTA) 200-200-20 MG/5ML suspension 30 mL  30 mL Oral Q4H PRN Charm Rings, NP      . benztropine (COGENTIN) tablet 0.5 mg  0.5 mg Oral Daily Jomarie Longs, MD   0.5 mg at 01/22/15 0814  . benztropine (COGENTIN) tablet 0.5 mg  0.5 mg Oral QHS Jomarie Longs, MD   0.5 mg at 01/21/15 2143  . divalproex (DEPAKOTE ER) 24 hr tablet 500 mg  500 mg Oral BID Thermon Leyland, NP   500 mg at 01/22/15 0815  . feeding supplement (ENSURE ENLIVE) (ENSURE ENLIVE) liquid 237 mL  237 mL Oral BID BM Maite Burlison, MD   237 mL at 01/22/15 0957  . ferrous sulfate tablet 325 mg  325 mg Oral BID WC Juliona Vales, MD   325 mg at 01/22/15 0815  . hydrochlorothiazide (MICROZIDE) capsule 12.5 mg  12.5 mg Oral Daily Charm Rings, NP   12.5 mg at 01/22/15 0815  . ibuprofen (ADVIL,MOTRIN) tablet 600 mg  600 mg Oral Q8H PRN Charm Rings, NP      . lisinopril (PRINIVIL,ZESTRIL) tablet 20 mg  20 mg Oral Daily Charm Rings, NP   20 mg at 01/22/15 0814  . magnesium hydroxide (MILK OF MAGNESIA) suspension 30 mL  30 mL Oral Daily PRN Charm Rings, NP      . nicotine (NICODERM CQ - dosed in mg/24 hours) patch 14 mg  14 mg Transdermal Daily Charm Rings, NP   14 mg at 01/22/15 0817  . risperiDONE (RISPERDAL) tablet 2 mg  2 mg Oral Daily Jomarie Longs, MD   2 mg at 01/22/15 0814  . risperiDONE (RISPERDAL) tablet 4 mg  4 mg Oral QHS Jomarie Longs, MD        Lab Results:  Results for orders placed or performed during the hospital encounter of 01/18/15 (from the past 48 hour(s))  Valproic acid level     Status: None   Collection Time: 01/22/15  6:11 AM  Result Value Ref Range   Valproic Acid Lvl 80 50.0 - 100.0 ug/mL    Comment: Performed at [facility]    Physical Findings: AIMS: Facial and Oral Movements Muscles of Facial Expression: None, normal Lips and Perioral Area: None, normal Jaw: None, normal Tongue: None, normal,Extremity  Movements Upper (arms, wrists, hands, fingers): None, normal Lower (legs, knees, ankles, toes): None, normal, Trunk Movements Neck, shoulders, hips: None, normal, Overall Severity Severity of abnormal movements (highest score from questions above): None, normal Incapacitation due to abnormal movements: None, normal Patient's awareness of abnormal movements (rate only patient's report): No Awareness, Dental Status Current problems with teeth and/or dentures?: No Does patient usually wear dentures?: No  CIWA:    COWS:          Assessment: Patient is a 39 y  old AAM with hx of schizoaffective disorder, presented with mania, delusions as well as AH. Pt today is more organized , however continues to be  tangential , continues to have pressured speech. Pt will continue to need inpatient stay as well as treatment.   Treatment Plan Summary: Daily contact with patient to assess and evaluate symptoms and progress in treatment and Medication management  Will increase Risperdal to 6 mg po daily for psychosis/mood lability. AIMS - 0  Will continue Cogentin  0.5 mg po bid for EPS. Will continue Depakote DR 500 mg po bid for mood lability.  Reviewed labs today - Depakote level on 01/22/15 - therapeutic - 80 ug/ml.   Will continue Feso4 325 mg po bid for iron deficiency anemia. Continue HCTZ/Lisinopril as scheduled for HTN. Continue nicotine patch for smoking cessation. Provided counseling.  Will continue to monitor vitals ,medication compliance and treatment side effects while patient is here.  Will monitor for medical issues as well as call consult as needed.  CSW will start working on disposition.  Patient to participate in therapeutic milieu .    Medical Decision Making:  Review of Psycho-Social Stressors (1), Review or order clinical lab tests (1), Review of Last Therapy Session (1), Review of Medication Regimen & Side Effects (2) and Review of New Medication or Change in Dosage  (2)     Cambria Osten MD 01/22/2015, 12:03 PM

## 2015-01-22 NOTE — Progress Notes (Signed)
[facility] Group Notes:  (Nursing/MHT/Case Management/Adjunct)  Date:  01/22/2015  Time:  9:36 PM  Type of Therapy:  Psychoeducational Skills  Participation Level:  Active  Participation Quality:  Attentive  Affect:  Excited  Cognitive:  Disorganized  Insight:  Appropriate  Engagement in Group:  Off Topic  Modes of Intervention:  Education  Summary of Progress/Problems: The patient expressed in group this evening that he had both a "unstable" as well as a "stable" day. He explained that his day started out well, but had a number of challenges as he had to deal with negativity . He also explained that he had to try to be himself today and that at times he felt as if he was "slipping into the dark side". As a theme for the day, he indicated that his relapse prevention will include remaining on his medication, "eating healthy", and trying to "relax more".   Hazle CocaGOODMAN, Thanh Pomerleau S 01/22/2015, 9:36 PM

## 2015-01-22 NOTE — Progress Notes (Signed)
Patient ID: Steven Clayton, male   DOB: 05/22/1981, 34 y.o.   MRN: [id number] PER STATE REGULATIONS 482.30  THIS CHART WAS REVIEWED FOR MEDICAL NECESSITY WITH RESPECT TO THE PATIENT'S ADMISSION/ DURATION OF STAY.  NEXT REVIEW DATE: 01/26/2015 Willa RoughJENNIFER JONES Dirck Butch, RN, BSN CASE MANAGER

## 2015-01-22 NOTE — Progress Notes (Signed)
The focus of this group is to help patients review their daily goal of treatment and discuss progress on daily workbooks. Pt attended the evening group and responded to all discussion prompts from the Writer. Pt shared that today was a good day on the unit, the highlight of which was learning he may be discharged tomorrow. Pt presented with disorganized thoughts, quickly jumping from one unrelated topic to another. Steven Clayton also broke into several bouts of uncontrollable laughter when discussing otherwise serious subjects, such as the suicide of a friend's mother and the murder of his younger brother years ago. He reported having no additional needs from Nursing Staff this evening. Pt's affect was animated.

## 2015-01-22 NOTE — Progress Notes (Signed)
Patient ID: Steven Clayton, male   DOB: 02/23/1981, 34 y.o.   MRN: [id number]  D: Patient pleasant and bright on assessment. Pt interacting well with peers in milieu. Pt denies SI/HI, but does state he is actively hearing voices. Pt verbally contracts for safety.  A: Q 15 minute safety checks, encourage staff/peer interaction and group participation. Administer medications as ordered by MD.  R: Pt compliant with medications and participated in group session. No inappropriate behaviors noted during shift.

## 2015-01-22 NOTE — [facility] Group Notes (Signed)
[facility] LCSW Group Therapy  01/22/2015  1:05 PM  Type of Therapy:  Group therapy  Participation Level:  Active  Participation Quality:  Attentive  Affect:  Flat  Cognitive:  Oriented  Insight:  Limited  Engagement in Therapy:  Limited  Modes of Intervention:  Discussion, Socialization  Summary of Progress/Problems:  Chaplain was here to lead a group on themes of hope and courage.  "Hope is having the perserverence to see something through.  Also, I have hope that I will get to know God better."  Continues to be tangential with some disorganization.  "I'm hope to be able to maintain with my disability.  I was having suicidal thoughts before I came in, and I don't want that to happen to me, because that's the end."  Would jump in after others spoke and add his thoughts that were only vaguely related what had just been said.  Daryel Geraldorth, Amairani Shuey B 01/22/2015 1:36 PM

## 2015-01-23 DIAGNOSIS — F250 Schizoaffective disorder, bipolar type: Principal | ICD-10-CM

## 2015-01-23 DIAGNOSIS — D509 Iron deficiency anemia, unspecified: Secondary | ICD-10-CM

## 2015-01-23 NOTE — Progress Notes (Signed)
Patient ID: Steven Clayton, male   DOB: 1980/11/29, 34 y.o.   MRN: [id number] [facility] MD Progress Note  01/23/2015 1:53 PM Steven Clayton  MRN:  [id number] Subjective: Patient states "My thoughts are better. I can't wait to get back to my apartment. I'm not as suspicious now as I was. But I still have to watch how I perceive people because the way I process information. I am not hearing or seeing things today. I feel like my medications are helping."  Objective:Patient seen and chart reviewed. Pt is a 34 year old AA male who presented voluntarily to the [facility] with complaints of suicidal ideation and auditory hallucinations. The patient appeared manic and had great difficulty engaging in assessments due to flight of ideas and tangential speech on admission day.  Patient  continues to have pressured speech , tangential thought process. However he has been responding to medications well. He is better organized than previous days. Tolerating medications well, denies any side effects. Pt when asked about psychosis - talks about voices that he hear when people use profanity but continues to have difficulty explaining his thoughts.  Per staff - pt continues to need redirection , due to impulsivity, intrusive behavior as well as pressured speech. Usually in group activities - pt needs a lot of support and redirection. Per staff - no disruptive issues noted on the unit.   Principal Problem: Schizoaffective disorder, bipolar type, multiple episodes ,currently in acute episode. Diagnosis:   Patient Active Problem List   Diagnosis Date Noted  . Iron deficiency anemia [D50.9] 01/20/2015  . Schizoaffective disorder, bipolar type [F25.0] 01/19/2015  . Tobacco use disorder, moderate, dependence [F17.200] 01/19/2015   Total Time spent with patient: 20 minutes  Past Medical History:  Past Medical History  Diagnosis Date  . Schizoaffective disorder    History reviewed. No pertinent past surgical history. Family  History:  Family History  Problem Relation Age of Onset  . Mental illness Neg Hx    Social History:  History  Alcohol Use  . 0.6 oz/week  . 1 Glasses of wine per week     History  Drug Use No    History   Social History  . Marital Status: Single    Spouse Name: N/A  . Number of Children: N/A  . Years of Education: N/A   Social History Main Topics  . Smoking status: Current Some Day Smoker  . Smokeless tobacco: Never Used  . Alcohol Use: 0.6 oz/week    1 Glasses of wine per week  . Drug Use: No  . Sexual Activity: No   Other Topics Concern  . None   Social History Narrative   Additional History:    Sleep: Good  Appetite:  Good  Musculoskeletal: Strength & Muscle Tone: within normal limits Gait & Station: normal Patient leans: N/A  Psychiatric Specialty Exam: Physical Exam  Review of Systems  Constitutional: Negative.   HENT: Negative.   Eyes: Negative.   Respiratory: Negative.   Cardiovascular: Negative.   Gastrointestinal: Negative.   Genitourinary: Negative.   Musculoskeletal: Negative.   Skin: Negative.   Neurological: Negative.   Endo/Heme/Allergies: Negative.   Psychiatric/Behavioral: Positive for hallucinations. The patient is nervous/anxious.     Blood pressure 130/74, pulse 133, temperature 98.2 F (36.8 C), temperature source Oral, resp. rate 16, height  (1.753 m), weight 87.544 kg (193 lb), SpO2 100 %.Body mass index is 28.49 kg/(m^2).  General Appearance: Casual  Eye Contact::  Good  Speech:  Pressured  Volume:  Normal  Mood:  Anxious  Affect:  Congruent  Thought Process:  Tangential  Orientation:  Full (Time, Place, and Person)  Thought Content:  Hallucinations: Auditory and Rumination  Suicidal Thoughts:  No  Homicidal Thoughts:  No  Memory:  Immediate;   Fair Recent;   Fair Remote;   Fair  Judgement:  Fair  Insight:  Fair  Psychomotor Activity:  Normal  Concentration:  Fair  Recall:  [employer] of Knowledge:Fair   Language: Fair  Akathisia:  No  Handed:  Right  AIMS (if indicated):     Assets:  Communication Skills Desire for Improvement Resilience  ADL's:  Intact  Cognition: WNL  Sleep:  Number of Hours: 6.75     Current Medications: Current Facility-Administered Medications  Medication Dose Route Frequency Provider Last Rate Last Dose  . acetaminophen (TYLENOL) tablet 650 mg  650 mg Oral Q6H PRN Charm Rings, NP   650 mg at 01/21/15 1610  . acetaminophen (TYLENOL) tablet 650 mg  650 mg Oral Q4H PRN Charm Rings, NP      . alum & mag hydroxide-simeth (MAALOX/MYLANTA) 200-200-20 MG/5ML suspension 30 mL  30 mL Oral Q4H PRN Charm Rings, NP      . benztropine (COGENTIN) tablet 0.5 mg  0.5 mg Oral Daily Jomarie Longs, MD   0.5 mg at 01/23/15 0955  . benztropine (COGENTIN) tablet 0.5 mg  0.5 mg Oral QHS Saramma Eappen, MD   0.5 mg at 01/22/15 2025  . divalproex (DEPAKOTE ER) 24 hr tablet 500 mg  500 mg Oral BID Thermon Leyland, NP   500 mg at 01/23/15 0956  . feeding supplement (ENSURE ENLIVE) (ENSURE ENLIVE) liquid 237 mL  237 mL Oral BID BM Saramma Eappen, MD   237 mL at 01/23/15 0958  . ferrous sulfate tablet 325 mg  325 mg Oral BID WC Jomarie Longs, MD   325 mg at 01/23/15 0956  . hydrochlorothiazide (MICROZIDE) capsule 12.5 mg  12.5 mg Oral Daily Charm Rings, NP   12.5 mg at 01/23/15 0955  . ibuprofen (ADVIL,MOTRIN) tablet 600 mg  600 mg Oral Q8H PRN Charm Rings, NP      . lisinopril (PRINIVIL,ZESTRIL) tablet 20 mg  20 mg Oral Daily Charm Rings, NP   20 mg at 01/23/15 0959  . magnesium hydroxide (MILK OF MAGNESIA) suspension 30 mL  30 mL Oral Daily PRN Charm Rings, NP      . nicotine (NICODERM CQ - dosed in mg/24 hours) patch 14 mg  14 mg Transdermal Daily Charm Rings, NP   14 mg at 01/23/15 0955  . risperiDONE (RISPERDAL) tablet 2 mg  2 mg Oral Daily Jomarie Longs, MD   2 mg at 01/23/15 0956  . risperiDONE (RISPERDAL) tablet 4 mg  4 mg Oral QHS Jomarie Longs, MD   4 mg  at 01/22/15 2025    Lab Results:  Results for orders placed or performed during the hospital encounter of 01/18/15 (from the past 48 hour(s))  Valproic acid level     Status: None   Collection Time: 01/22/15  6:11 AM  Result Value Ref Range   Valproic Acid Lvl 80 50.0 - 100.0 ug/mL    Comment: Performed at [facility]    Physical Findings: AIMS: Facial and Oral Movements Muscles of Facial Expression: None, normal Lips and Perioral Area: None, normal Jaw: None, normal Tongue: None, normal,Extremity Movements Upper (arms, wrists, hands, fingers): None, normal  Lower (legs, knees, ankles, toes): None, normal, Trunk Movements Neck, shoulders, hips: None, normal, Overall Severity Severity of abnormal movements (highest score from questions above): None, normal Incapacitation due to abnormal movements: None, normal Patient's awareness of abnormal movements (rate only patient's report): No Awareness, Dental Status Current problems with teeth and/or dentures?: No Does patient usually wear dentures?: No  CIWA:    COWS:      Assessment: Patient is a [age] y old AAM with hx of schizoaffective disorder, presented with mania, delusions as well as AH. Pt today is more organized , however continues to be  tangential , continues to have pressured speech. Pt will continue to need inpatient stay as well as treatment.  Treatment Plan Summary: Daily contact with patient to assess and evaluate symptoms and progress in treatment and Medication management  Continue Risperdal to 6 mg po daily for psychosis/mood lability. AIMS - 0  Continue Cogentin  0.5 mg po bid for EPS. Continue Depakote DR 500 mg po bid for mood lability.  Reviewed labs today - Depakote level on 01/22/15 - therapeutic - 80 ug/ml. Continue Feso4 325 mg po bid for iron deficiency anemia. Continue HCTZ/Lisinopril as scheduled for HTN. Continue nicotine patch for smoking cessation. Provided counseling. Will continue to monitor  vitals ,medication compliance and treatment side effects while patient is here.  Will monitor for medical issues as well as call consult as needed.  CSW will start working on disposition.  Patient to participate in therapeutic milieu .   Medical Decision Making:  Established Problem, Stable/Improving (1), Review of Psycho-Social Stressors (1), Review or order clinical lab tests (1), Order AIMS Test (2), Review of Last Therapy Session (1) and Review of Medication Regimen & Side Effects (2)  DAVIS, LAURA NP-C 01/23/2015, 1:53 PM  Reviewed the information documented and agree with the treatment plan.  Sanchez Hemmer,JANARDHAHA R. 01/24/2015 5:00 PM

## 2015-01-23 NOTE — [facility] Group Notes (Signed)
[facility] Group Notes:  (Clinical Social Work)  01/23/2015  11:15-12:00PM  Summary of Progress/Problems:   The main focus of today's process group was to discuss patients' feelings related to being hospitalized, as well as the difference between "being" and "having" a mental health diagnosis.  It was agreed in general by the group that it would be preferable to avoid future hospitalizations, and we discussed means of doing that.  As a follow-up, problems with adhering to medication recommendations were discussed.  The patient expressed their primary feeling about being hospitalized is glad, because he has professionals looking at his illness to try to help him.  He was very disorganized throughout his talking during group, with flight of ideas, but very pleasant, encouraging to others.  Type of Therapy:  Group Therapy - Process  Participation Level:  Active  Participation Quality:  Attentive, Sharing and Supportive  Affect:  Blunted  Cognitive:  Disorganized  Insight:  Developing/Improving  Engagement in Therapy:  Engaged  Modes of Intervention:  Exploration, Discussion  Ambrose MantleMareida Grossman-Orr, LCSW 01/23/2015, 1:12 PM

## 2015-01-23 NOTE — Progress Notes (Addendum)
D:Patient in the the dayroom on approach.  Patient states he had a good day.  Patient states he has been attending groups and going outside.  Patient states his goal is to get home and get back to his everyday life.  Patient still presents as euphoric but cooperative with staff and peers. Patient denies SI/HI and patient states he has auditory hallucinations but they are not saying negative things.  Patient verbally contracts for safety.   A: Staff to monitor Q 15 mins for safety.  Encouragement and support offered.  Scheduled medications administered per orders. R: Patient remains safe on the unit.  Patient attended group tonight.  Patient visible on the unit and interacting with peers.  Patient taking administered medications.

## 2015-01-23 NOTE — Progress Notes (Signed)
D) Pt has attended the program and interacts with his peers appropriately. Affect and mood are appropriate. Pt denies SI and HI, delusions and hallucinations. A) Given support, reassurance and praise along with encouragement. R) Denies SI and HI hallucinations and dleusions.

## 2015-01-24 NOTE — Progress Notes (Signed)
D:Patient in the hallway on approach.  Patient states he had a great day.  Patient states he feels better.  Patient states he like the fact he could come here for help.  Patient is still disorganized at times.  Patient states he knew his head was not right.  Patient denies SI/HI and states he has auditory hallucinations.  Patient verbally contracts for safety. A: Staff to monitor Q 15 mins for safety.  Encouragement and support offered.  Scheduled medications administered per orders. R: Patient remains safe on the unit.  Patient attended group tonight.  Patient visible on the unit and interacting with peers.  Patient taking administered medications.

## 2015-01-24 NOTE — Plan of Care (Signed)
Problem: Alteration in thought process Goal: LTG-Patient has not harmed self or others in at least 2 days Outcome: Progressing Patient has not harmed self other at all this admission.  Patient denies SI/HI.  Patient verbally contracts for safety.

## 2015-01-24 NOTE — Progress Notes (Signed)
D) Pt has been attending the groups and interacting with his peers appropriately. Affect and mood are appropriate. Pt states he is feeling much better overall "the medicine is working for me". States, "I try to get along with everyone". Pt denies SI and HI, delusions and hallucinations. Feels ready to be discharged to home. A) Given support, reassurance and praise. Provided with a 1:1. Therapeutic humor used with Pt. Encouragement given. R) Denies SI and HI, delusions and hallucinations

## 2015-01-24 NOTE — Progress Notes (Signed)
[facility] Group Notes:  (Nursing/MHT/Case Management/Adjunct)  Date:  01/24/2015  Time:  9:42 PM  Type of Therapy:  Psychoeducational Skills  Participation Level:  Active  Participation Quality:  Appropriate  Affect:  Excited  Cognitive:  Appropriate  Insight:  Lacking  Engagement in Group:  Developing/Improving  Modes of Intervention:  Education  Summary of Progress/Problems: The patient expressed in group that he had a better day today. He states that he is feeling more relaxed today. As a theme for the day, his support system will consist of himself, sister, and nieces and nephews.   Hazle CocaGOODMAN, Catlin Doria S 01/24/2015, 9:42 PM

## 2015-01-24 NOTE — [facility] Group Notes (Signed)
[facility] Group Notes:  (Clinical Social Work)  01/24/2015  [facility] Group Notes:  (Clinical Social Work)  01/24/2015  11:00AM-12:00PM  Summary of Progress/Problems:  The main focus of today's process group was to listen to a variety of genres of music and to identify that different types of music provoke different responses.  The patient then was able to identify personally what was soothing for them, as well as energizing.  Handouts were used to record feelings evoked, as well as how patient can personally use this knowledge in sleep habits, with depression, and with other symptoms.  The patient expressed understanding of concepts, as well as knowledge of how each type of music affected him/her and how this can be used at home as a wellness/recovery tool. He was bright throughout group, talked about which songs really made him feel good and how he feels he can recreate this at home.  Type of Therapy:  Music Therapy   Participation Level:  Active  Participation Quality:  Attentive and Sharing  Affect:  Blunted  Cognitive:  Oriented  Insight:  Engaged  Engagement in Therapy:  Engaged  Modes of Intervention:   Activity, Exploration  Ambrose MantleMareida Grossman-Orr, LCSW 01/24/2015

## 2015-01-24 NOTE — Progress Notes (Signed)
Patient ID: Steven Clayton, male   DOB: 11/25/1980, 34 y.o.   MRN: [id number] [facility]  01/24/2015 3:34 PM Steven Clayton  MRN:  [id number] Subjective: Patient states "I'm having a good day. I just don't want to be around anyone who is going to yell at me. You never know how people will treat you."   Objective:Patient seen and chart reviewed. Pt is a 34 year old AA male who [facility] with complaints of suicidal ideation and auditory hallucinations. The patient appeared manic and had great difficulty engaging in assessments due to flight of ideas and tangential speech on admission day.  Patient has less pressured speech but tangential thought process are still present. However he has been responding to medications well. He is better organized than previous days but continues to obsess about how people perceive him. Tolerating medications well, denies any side effects. He is denying any psychotic symptoms today and is hoping to discharge tomorrow.   Principal Problem: Schizoaffective disorder, bipolar type, multiple episodes ,currently in acute episode. Diagnosis:   Patient Active Problem List   Diagnosis Date Noted  . Iron deficiency anemia [D50.9] 01/20/2015  . Schizoaffective disorder, bipolar type [F25.0] 01/19/2015  . Tobacco use disorder, moderate, dependence [F17.200] 01/19/2015   Total Time spent with patient: 15 minutes  Past Medical History:  Past Medical History  Diagnosis Date  . Schizoaffective disorder    History reviewed. No pertinent past surgical history. Family History:  Family History  Problem Relation Age of Onset  . Mental illness Neg Hx    Social History:  History  Alcohol Use  . 0.6 oz/week  . 1 Glasses of wine per week     History  Drug Use No    History   Social History  . Marital Status: Single    Spouse Name: N/A  . Number of Children: N/A  . Years of Education: N/A   Social History Main Topics  . Smoking  status: Current Some Day Smoker  . Smokeless tobacco: Never Used  . Alcohol Use: 0.6 oz/week    1 Glasses of wine per week  . Drug Use: No  . Sexual Activity: No   Other Topics Concern  . None   Social History Narrative   Additional History:    Sleep: Good  Appetite:  Good  Musculoskeletal: Strength & Muscle Tone: within normal limits Gait & Station: normal Patient leans: N/A  Psychiatric Specialty Exam: Physical Exam  Review of Systems  Constitutional: Negative.   HENT: Negative.   Eyes: Negative.   Respiratory: Negative.   Cardiovascular: Negative.   Gastrointestinal: Negative.   Genitourinary: Negative.   Musculoskeletal: Negative.   Skin: Negative.   Neurological: Negative.   Endo/Heme/Allergies: Negative.   Psychiatric/Behavioral: Negative for depression, suicidal ideas, hallucinations, memory loss and substance abuse. The patient is nervous/anxious. The patient does not have insomnia.     Blood pressure 125/75, pulse 81, temperature 97.8 F (36.6 C), temperature source Oral, resp. rate 16, height 5\' 9"  (1.753 m), weight 87.544 kg (193 lb), SpO2 100 %.Body mass index is 28.49 kg/(m^2).  General Appearance: Casual  Eye Contact::  Good  Speech:  Pressured-improving   Volume:  Normal  Mood:  Anxious  Affect:  Congruent  Thought Process:  Tangential  Orientation:  Full (Time, Place, and Person)  Thought Content:  Rumination  Suicidal Thoughts:  No  Homicidal Thoughts:  No  Memory:  Immediate;   Fair Recent;   Fair Remote;  Fair  Judgement:  Fair  Insight:  Fair  Psychomotor Activity:  Normal  Concentration:  Fair  Recall:  [employer] of Knowledge:Fair  Language: Fair  Akathisia:  No  Handed:  Right  AIMS (if indicated):     Assets:  Communication Skills Desire for Improvement Resilience  ADL's:  Intact  Cognition: WNL  Sleep:  Number of Hours: 6.25   Current Medications: Current Facility-Administered Medications  Medication Dose Route  Frequency Provider Last Rate Last Dose  . acetaminophen (TYLENOL) tablet 650 mg  650 mg Oral Q6H PRN Charm Rings, NP   650 mg at 01/21/15 1610  . acetaminophen (TYLENOL) tablet 650 mg  650 mg Oral Q4H PRN Charm Rings, NP      . alum & mag hydroxide-simeth (MAALOX/MYLANTA) 200-200-20 MG/5ML suspension 30 mL  30 mL Oral Q4H PRN Charm Rings, NP      . benztropine (COGENTIN) tablet 0.5 mg  0.5 mg Oral Daily Jomarie Longs, MD   0.5 mg at 01/24/15 0837  . benztropine (COGENTIN) tablet 0.5 mg  0.5 mg Oral QHS Jomarie Longs, MD   0.5 mg at 01/23/15 2211  . divalproex (DEPAKOTE ER) 24 hr tablet 500 mg  500 mg Oral BID Thermon Leyland, NP   500 mg at 01/24/15 9604  . feeding supplement (ENSURE ENLIVE) (ENSURE ENLIVE) liquid 237 mL  237 mL Oral BID BM Saramma Eappen, MD   237 mL at 01/24/15 1448  . ferrous sulfate tablet 325 mg  325 mg Oral BID WC Jomarie Longs, MD   325 mg at 01/24/15 0837  . hydrochlorothiazide (MICROZIDE) capsule 12.5 mg  12.5 mg Oral Daily Charm Rings, NP   12.5 mg at 01/24/15 0837  . ibuprofen (ADVIL,MOTRIN) tablet 600 mg  600 mg Oral Q8H PRN Charm Rings, NP      . lisinopril (PRINIVIL,ZESTRIL) tablet 20 mg  20 mg Oral Daily Charm Rings, NP   20 mg at 01/24/15 0837  . magnesium hydroxide (MILK OF MAGNESIA) suspension 30 mL  30 mL Oral Daily PRN Charm Rings, NP      . nicotine (NICODERM CQ - dosed in mg/24 hours) patch 14 mg  14 mg Transdermal Daily Charm Rings, NP   14 mg at 01/24/15 0841  . risperiDONE (RISPERDAL) tablet 2 mg  2 mg Oral Daily Jomarie Longs, MD   2 mg at 01/24/15 0837  . risperiDONE (RISPERDAL) tablet 4 mg  4 mg Oral QHS Jomarie Longs, MD   4 mg at 01/23/15 2211    Lab Results:  No results found for this or any previous visit (from the past 48 hour(s)).  Physical Findings: AIMS: Facial and Oral Movements Muscles of Facial Expression: None, normal Lips and Perioral Area: None, normal Jaw: None, normal Tongue: None, normal,Extremity  Movements Upper (arms, wrists, hands, fingers): None, normal Lower (legs, knees, ankles, toes): None, normal, Trunk Movements Neck, shoulders, hips: None, normal, Overall Severity Severity of abnormal movements (highest score from questions above): None, normal Incapacitation due to abnormal movements: None, normal Patient's awareness of abnormal movements (rate only patient's report): No Awareness, Dental Status Current problems with teeth and/or dentures?: No Does patient usually wear dentures?: No  CIWA:    COWS:      Assessment: Patient is a 23 y old AAM with hx of schizoaffective disorder, presented with mania, delusions as well as AH. Pt today is more organized , however continues to be  tangential ,  continues to have pressured speech. Pt will continue to need inpatient stay as well as treatment.  Treatment Plan Summary: Daily contact with patient to assess and evaluate symptoms and progress in treatment and Medication management  Risperdal to 6 mg po daily for psychosis/mood lability. AIMS - 0  Cogentin  0.5 mg po bid for EPS. Depakote DR 500 mg po bid for mood lability.  Reviewed labs today - Depakote level on 01/22/15 - therapeutic - 80 ug/ml. Will continue Feso4 325 mg po bid for iron deficiency anemia. Continue HCTZ/Lisinopril as scheduled for HTN. Continue nicotine patch for smoking cessation. Provided counseling. Will continue to monitor vitals ,medication compliance and treatment side effects while patient is here.  Will monitor for medical issues as well as call consult as needed.  CSW will start working on disposition.  Patient to participate in therapeutic milieu .   Medical Decision Making:  Established Problem, Stable/Improving (1), Review of Psycho-Social Stressors (1), Review or order clinical lab tests (1), Order AIMS Test (2), Review of Last Therapy Session (1) and Review of Medication Regimen & Side Effects (2)  DAVIS, LAURA NP-C 01/24/2015, 3:34  PM  Reviewed the information documented and agree with the treatment plan.  Mersades Barbaro,JANARDHAHA R. 01/24/2015 5:03 PM

## 2015-01-25 MED ORDER — LISINOPRIL-HYDROCHLOROTHIAZIDE 20-12.5 MG PO TABS: 1.0000 | ORAL_TABLET | Freq: Every day | ORAL | Status: DC

## 2015-01-25 MED ORDER — RISPERIDONE 2 MG PO TABS: 2.0000 mg | ORAL_TABLET | Freq: Every day | ORAL | Status: DC

## 2015-01-25 MED ORDER — FERROUS SULFATE 325 (65 FE) MG PO TABS: 325.0000 mg | ORAL_TABLET | Freq: Two times a day (BID) | ORAL | Status: DC

## 2015-01-25 MED ORDER — RISPERIDONE 4 MG PO TABS: 4.0000 mg | ORAL_TABLET | Freq: Every day | ORAL | Status: DC

## 2015-01-25 MED ORDER — BENZTROPINE MESYLATE 0.5 MG PO TABS: 0.5000 mg | ORAL_TABLET | Freq: Every day | ORAL | Status: DC

## 2015-01-25 MED ORDER — NICOTINE 14 MG/24HR TD PT24: 14.0000 mg | MEDICATED_PATCH | Freq: Every day | TRANSDERMAL | Status: DC

## 2015-01-25 MED ORDER — DIVALPROEX SODIUM ER 500 MG PO TB24: 500.0000 mg | ORAL_TABLET | Freq: Two times a day (BID) | ORAL | Status: DC

## 2015-01-25 NOTE — Progress Notes (Signed)
D:  Patient's self inventory sheet, patient slept good last night, sleep medication is helpful.  Good appetite, normal energy level, good concentration.  Rated depression, hopeless and anxiety #5.  Withdrawals, cravings, agitation, irritability.  SI, contracts for safety.  Denied physical problems.  Denied pain.  Goal is to work on occupations.  Plans to be honest.  Does have discharge plans. A:  Medications administered per MD orders.  Emotional support and encouragement given patient. R:  Denied SI and HI this morning while talking to nurse.  Contracts for safety.  Denied A/V hallucinations.  Denied pain.  "When I take my medicines, I don't hear voices."   Patient looking forward to discharging today to his apartment.

## 2015-01-25 NOTE — Discharge Summary (Signed)
Physician Discharge Summary Note  Patient:  Steven Clayton is an 34 y.o., male MRN:  [id number] DOB:  02/03/1981 Patient phone:  [phone] (home)  Patient address:   [address] [place]  [place] [address],  Total Time spent with patient: 30 minutes  Date of Admission:  01/18/2015 Date of Discharge: 01/25/15  Reason for Admission:  Mood stabilization treatments  Principal Problem: Schizoaffective disorder, bipolar type Discharge Diagnoses: Patient Active Problem List   Diagnosis Date Noted  . Iron deficiency anemia [D50.9] 01/20/2015  . Schizoaffective disorder, bipolar type [F25.0] 01/19/2015  . Tobacco use disorder, moderate, dependence [F17.200] 01/19/2015   Musculoskeletal: Strength & Muscle Tone: within normal limits Gait & Station: normal Patient leans: N/A  Psychiatric Specialty Exam: Physical Exam  Psychiatric: He has a normal mood and affect. His speech is normal and behavior is normal. Judgment and thought content normal. Cognition and memory are normal.    Review of Systems  Constitutional: Negative.   HENT: Negative.   Eyes: Negative.   Respiratory: Negative.   Cardiovascular: Negative.   Gastrointestinal: Negative.   Genitourinary: Negative.   Musculoskeletal: Negative.   Skin: Negative.   Neurological: Negative.   Endo/Heme/Allergies: Negative.   Psychiatric/Behavioral: Negative for depression, suicidal ideas, hallucinations, memory loss and substance abuse. The patient is not nervous/anxious and does not have insomnia.     Blood pressure 118/80, pulse 85, temperature 97.8 F (36.6 C), temperature source Oral, resp. rate 18, height  (1.753 m), weight 87.544 kg (193 lb), SpO2 100 %.Body mass index is 28.49 kg/(m^2).  See Physician SRA     Have you used any form of tobacco in the last 30 days? (Cigarettes, Smokeless Tobacco, Cigars, and/or Pipes): Yes  Has this patient used any form of tobacco in the last 30 days? (Cigarettes, Smokeless  Tobacco, Cigars, and/or Pipes) Yes, Prescription provided for nicotine patch  Past Medical History:  Past Medical History  Diagnosis Date  . Schizoaffective disorder    History reviewed. No pertinent past surgical history. Family History:  Family History  Problem Relation Age of Onset  . Mental illness Neg Hx    Social History:  History  Alcohol Use  . 0.6 oz/week  . 1 Glasses of wine per week     History  Drug Use No    History   Social History  . Marital Status: Single    Spouse Name: N/A  . Number of Children: N/A  . Years of Education: N/A   Social History Main Topics  . Smoking status: Current Some Day Smoker  . Smokeless tobacco: Never Used  . Alcohol Use: 0.6 oz/week    1 Glasses of wine per week  . Drug Use: No  . Sexual Activity: No   Other Topics Concern  . None   Social History Narrative    Risk to Self: Is patient at risk for suicide?: No What has been your use of drugs/alcohol within the last 12 months?: Pt denies Risk to Others:   Prior Inpatient Therapy:   Prior Outpatient Therapy:    Level of Care:  OP  Hospital Course:    Steven Clayton is a 34 year old male who presented voluntarily to the [facility] with complaints of suicidal ideation and auditory hallucinations. The patient appears manic and has had great difficulty engaging in assessments due to flight of ideas and tangential speech. He also reported in the [facility] that he sees dead people. His thought processes are very illogical during assessment and  he often gives answers that do not match the original question. For instance, when asked about the presence of suicidal thoughts he became upset stating "That only happens to people with Bipolar Disorder." Patient also appears to be delusional and paranoid stating "People are affecting my perspective by sending me bad vibes. I think people are doing that to get to me. A man in a youth program put all my information out there. Now the public knows I am  ill. My brother was also shot in the head when he was [age]. I am not the killer. It does not affect me emotionally since I did not do anything to him!" Steven Clayton was noted to be delusional, paranoid, and manic throughout the assessment. He reported being off his medications for at least three months due to problems with medicaid per his report. The patient became mildly agitated with writer when trying to express his points, which were noted to be very irrational. He appeared to be extremely upset about the way he perceives other people but had a difficult times articulating his thoughts. He reports being at several mental health facilities in the past [facility] and [facility].          Steven Clayton was admitted to the adult 500 unit. He was evaluated and his symptoms were identified. Medication management was discussed and initiated. His Depakote was increased to 500 mg twice daily for improved mood stability. Patient was started on oral Risperdal as on admission as he reported skin reaction to the Risperdal Consta injections. His Risperdal was titrated to 2 mg daily and 4 mg at bedtime. He was oriented to the unit and encouraged to participate in unit programming. Medical problems were identified and treated appropriately. Home medication was restarted as needed.        The patient was evaluated each day by a clinical provider to ascertain the patient's response to treatment.  The patient was cooperative with follow up assessments. He was noted to respond to medications as evidenced by more organized thought processes and less tangential speech. On admission the patient appeared manic with pressured speech and flight of ideas. Per staff he needed some redirection on the unit due to impulsive and intrusive behaviors. Improvement was noted by the patient's report of decreasing symptoms, improved sleep and appetite, affect, medication tolerance, behavior, and participation in unit programming.  He was  asked each day to complete a self inventory noting mood, mental status, pain, new symptoms, anxiety and concerns.         He responded well to medication and being in a therapeutic and supportive environment. Positive and appropriate behavior was noted and the patient was motivated for recovery.  The patient worked closely with the treatment team and case manager to develop a discharge plan with appropriate goals. Coping skills, problem solving as well as relaxation therapies were also part of the unit programming.         By the day of discharge he was in much improved condition than upon admission.  Symptoms were reported as significantly decreased or resolved completely. The patient denied SI/HI and voiced no AVH. He was motivated to continue taking medication with a goal of continued improvement in mental health.  Steven Clayton was discharged home with a plan to follow up as noted below. The patient was provided with sample medications and prescriptions at time of discharge. He left [facility] in stable condition with all belongings returned to him.   Consults:  psychiatry  Significant Diagnostic Studies:  Chemistry panel, Lipid panel, Iron profile, Depakote level within therapeutic range, EKG  Discharge Vitals:   Blood pressure 118/80, pulse 85, temperature 97.8 F (36.6 C), temperature source Oral, resp. rate 18, height  (1.753 m), weight 87.544 kg (193 lb), SpO2 100 %. Body mass index is 28.49 kg/(m^2). Lab Results:   No results found for this or any previous visit (from the past 72 hour(s)).  Physical Findings: AIMS: Facial and Oral Movements Muscles of Facial Expression: None, normal Lips and Perioral Area: None, normal Jaw: None, normal Tongue: None, normal,Extremity Movements Upper (arms, wrists, hands, fingers): None, normal Lower (legs, knees, ankles, toes): None, normal, Trunk Movements Neck, shoulders, hips: None, normal, Overall Severity Severity of abnormal movements (highest  score from questions above): None, normal Incapacitation due to abnormal movements: None, normal Patient's awareness of abnormal movements (rate only patient's report): No Awareness, Dental Status Current problems with teeth and/or dentures?: No Does patient usually wear dentures?: No  CIWA:  CIWA-Ar Total: 1 COWS:  COWS Total Score: 2   See Psychiatric Specialty Exam and Suicide Risk Assessment completed by Attending Physician prior to discharge.  Discharge destination:  Home  Is patient on multiple antipsychotic therapies at discharge:  No   Has Patient had three or more failed trials of antipsychotic monotherapy by history:  No  Recommended Plan for Multiple Antipsychotic Therapies: NA  Discharge Instructions    Discharge instructions    Complete by:  As directed   Please follow up with your Primary Care Provider for further management of chronic medical problems and refills of medications. An iron supplement was started during the hospital stay due to detection of anemia.            Medication List    STOP taking these medications        risperiDONE microspheres 37.5 MG injection  Commonly known as:  RISPERDAL CONSTA      TAKE these medications      Indication   benztropine 0.5 MG tablet  Commonly known as:  COGENTIN  Take 1 tablet (0.5 mg total) by mouth daily.   Indication:  Extrapyramidal Reaction caused by Medications     benztropine 0.5 MG tablet  Commonly known as:  COGENTIN  Take 1 tablet (0.5 mg total) by mouth at bedtime.   Indication:  Extrapyramidal Reaction caused by Medications     divalproex 500 MG 24 hr tablet  Commonly known as:  DEPAKOTE ER  Take 1 tablet (500 mg total) by mouth 2 (two) times daily. For mood control.   Indication:  schizoaffective disorder     ferrous sulfate 325 (65 FE) MG tablet  Take 1 tablet (325 mg total) by mouth 2 (two) times daily with a meal.   Indication:  Anemia From Inadequate Iron in the Body      lisinopril-hydrochlorothiazide 20-12.5 MG per tablet  Commonly known as:  PRINZIDE,ZESTORETIC  Take 1 tablet by mouth daily.   Indication:  High Blood Pressure     nicotine 14 mg/24hr patch  Commonly known as:  NICODERM CQ - dosed in mg/24 hours  Place 1 patch (14 mg total) onto the skin daily.   Indication:  Nicotine Addiction     risperiDONE 2 MG tablet  Commonly known as:  RISPERDAL  Take 1 tablet (2 mg total) by mouth daily.   Indication:  Psychosis     risperidone 4 MG tablet  Commonly known as:  RISPERDAL  Take 1 tablet (4  mg total) by mouth at bedtime.   Indication:  Manic-Depression, Psychosis         Follow-up recommendations:   Activity: No restrictions Diet: regular Tests: depakote level as well as CBC,LFTs to be follwed per out patient recommendations. Other: follow up with after care as scheduled  Comments:   Take all your medications as prescribed by your mental healthcare provider.  Report any adverse effects and or reactions from your medicines to your outpatient provider promptly.  Patient is instructed and cautioned to not engage in alcohol and or illegal drug use while on prescription medicines.  In the event of worsening symptoms, patient is instructed to call the crisis hotline, 911 and or go to the nearest ED for appropriate evaluation and treatment of symptoms.  Follow-up with your primary care provider for your other medical issues, concerns and or health care needs.   Total Discharge Time: Greater than 30 minutes  Signed: Saif Peter NP-C 01/25/2015, 9:39 AM

## 2015-01-25 NOTE — Progress Notes (Signed)
  [facility] :  Will you be returning to the same living situation after discharge:  Yes,  home At discharge, do you have transportation home?: Yes,  mother Do you have the ability to pay for your medications: Yes,  MCD  Release of information consent forms completed and in the chart;  Patient's signature needed at discharge.  Patient to Follow up at: Follow-up Information    Follow up with [facility].   Why:  Go to the walk-in clinic this week M-F between 8 and 11 AM for your hospital follow up appointment.   Contact information:   [address]  [place]  [phone]] [phone]      [facility].   Why:  Call Pat to set up an assessment time and date.   Contact information:   [address]  [place]  [336] [phone]      Patient denies SI/HI: Yes,  yes    Safety Planning and Suicide Prevention discussed: Yes,  yes  Have you used any form of tobacco in the last 30 days? (Cigarettes, Smokeless Tobacco, Cigars, and/or Pipes): Yes  Has patient been referred to the Quitline?: Yes, faxed on 01/25/15  Ida Rogueorth, Nicola Heinemann B 01/25/2015, 10:13 AM

## 2015-01-25 NOTE — [facility] Suicide Risk Assessment (Signed)
[facility]   Demographic Factors:  Male  Total Time spent with patient: 30 minutes  Musculoskeletal: Strength & Muscle Tone: within normal limits Gait & Station: normal Patient leans: N/A  Psychiatric Specialty Exam: Physical Exam  Review of Systems  Psychiatric/Behavioral: Positive for depression (stable).  All other systems reviewed and are negative.   Blood pressure 118/80, pulse 85, temperature 97.8 F (36.6 C), temperature source Oral, resp. rate 18, height 5\' 9"  (1.753 m), weight 87.544 kg (193 lb), SpO2 100 %.Body mass index is 28.49 kg/(m^2).  General Appearance: Casual  Eye Contact::  Fair  Speech:  Clear and Coherent409  Volume:  Normal  Mood:  Euthymic  Affect:  Congruent  Thought Process:  Coherent  Orientation:  Full (Time, Place, and Person)  Thought Content:  WDL  Suicidal Thoughts:  No  Homicidal Thoughts:  No  Memory:  Immediate;   Fair Recent;   Fair Remote;   Fair  Judgement:  Fair  Insight:  Fair  Psychomotor Activity:  Normal  Concentration:  Fair  Recall:  FiservFair  Fund of Knowledge:Fair  Language: Fair  Akathisia:  No  Handed:  Right  AIMS (if indicated):     Assets:  Communication Skills Desire for Improvement  Sleep:  Number of Hours: 5.75  Cognition: WNL  ADL's:  Intact   Have you used any form of tobacco in the last 30 days? (Cigarettes, Smokeless Tobacco, Cigars, and/or Pipes): Yes  Has this patient used any form of tobacco in the last 30 days? (Cigarettes, Smokeless Tobacco, Cigars, and/or Pipes) Yes, Prescription  provided for nicotine patch  Mental Status Per Nursing Assessment::   On Admission:  NA  Current Mental Status by Physician: patient is pleasant , calm , denies SI/HI/AH/VH.  Loss Factors: NA  Historical Factors: Impulsivity  Risk Reduction Factors:   Positive social support  Continued Clinical Symptoms:  Previous Psychiatric Diagnoses and Treatments Medical Diagnoses and  Treatments/Surgeries  Cognitive Features That Contribute To Risk:  None    Suicide Risk:  Minimal: No identifiable suicidal ideation.  Patients presenting with no risk factors but with morbid ruminations; may be classified as minimal risk based on the severity of the depressive symptoms  Principal Problem: Schizoaffective disorder, bipolar type Discharge Diagnoses:  Patient Active Problem List   Diagnosis Date Noted  . Iron deficiency anemia [D50.9] 01/20/2015  . Schizoaffective disorder, bipolar type [F25.0] 01/19/2015  . Tobacco use disorder, moderate, dependence [F17.200] 01/19/2015      Plan Of Care/Follow-up recommendations:  Activity:  No restrictions Diet:  regular Tests:  depakote level as well as CBC,LFTs to be follwed per out patient recommendations. Other:  follow up with after care as scheduled  Is patient on multiple antipsychotic therapies at discharge:  No   Has Patient had three or more failed trials of antipsychotic monotherapy by history:  No  Recommended Plan for Multiple Antipsychotic Therapies: NA    Itzelle Gains md 01/25/2015, 9:12 AM

## 2015-01-25 NOTE — Tx Team (Signed)
Interdisciplinary Treatment Plan Update (Adult)  Date:  01/25/2015   Time Reviewed:  9:59 AM   Progress in Treatment: Attending groups: Yes. Participating in groups:  Yes. Taking medication as prescribed:  Yes. Tolerating medication:  Yes. Family/Significant othe contact made:  Yes Patient understands diagnosis:  Yes   Discussing patient identified problems/goals with staff:  Yes, see initial care plan. Medical problems stabilized or resolved:  Yes. Denies suicidal/homicidal ideation: Yes. Issues/concerns per patient self-inventory:  No. Other:  New problem(s) identified:  Discharge Plan or Barriers: return home, follow up outpt  Reason for Continuation of Hospitalization:   Comments:  Estimated length of stay:  D/C today  New goal(s):  Review of initial/current patient goals per problem list:     Attendees: Patient:  01/25/2015 9:59 AM   Family:   01/25/2015 9:59 AM   Physician:  Jomarie LongsSaramma Eappen, MD 01/25/2015 9:59 AM   Nursing:   Malva LimesLinsey Strader, RN 01/25/2015 9:59 AM   CSW:   Richelle Itood Blanch Stang, LCSW   01/25/2015 9:59 AM   Other:  01/25/2015 9:59 AM   Other:   01/25/2015 9:59 AM   Other:  Onnie BoerJennifer Clark, Nurse CM 01/25/2015 9:59 AM   Other:  Leisa LenzValerie Enoch, [facility] 01/25/2015 9:59 AM   Other:  Tomasita Morrowelora Sutton, P4CC  01/25/2015 9:59 AM   Other:  01/25/2015 9:59 AM   Other:  01/25/2015 9:59 AM   Other:  01/25/2015 9:59 AM   Other:  01/25/2015 9:59 AM   Other:  01/25/2015 9:59 AM   Other:   01/25/2015 9:59 AM    Scribe for Treatment Team:   Ida RogueNorth, Ceilidh Torregrossa B, 01/25/2015 9:59 AM

## 2015-01-25 NOTE — Progress Notes (Signed)
Discharge Note:  Patient discharged home with mother.  Denied SI and HI.  Denied A/V hallucinations.  Denied pain.  Suicide prevention information given and discussed with patient who stated he understood and had no questions.  Patient stated he received all his belongings, clothing, misc items, toiletries, prescriptions, medications.  Patient stated he [facility].

## 2015-03-23 DIAGNOSIS — F250 Schizoaffective disorder, bipolar type: Secondary | ICD-10-CM | POA: Diagnosis not present

## 2015-04-23 DIAGNOSIS — F250 Schizoaffective disorder, bipolar type: Secondary | ICD-10-CM | POA: Diagnosis not present

## 2015-06-11 DIAGNOSIS — F250 Schizoaffective disorder, bipolar type: Secondary | ICD-10-CM | POA: Diagnosis not present

## 2015-07-22 DIAGNOSIS — F250 Schizoaffective disorder, bipolar type: Secondary | ICD-10-CM | POA: Diagnosis not present

## 2015-07-23 DIAGNOSIS — F250 Schizoaffective disorder, bipolar type: Secondary | ICD-10-CM | POA: Diagnosis not present

## 2015-11-16 DIAGNOSIS — F250 Schizoaffective disorder, bipolar type: Secondary | ICD-10-CM | POA: Diagnosis not present

## 2015-12-22 DIAGNOSIS — F250 Schizoaffective disorder, bipolar type: Secondary | ICD-10-CM | POA: Diagnosis not present

## 2016-01-05 DIAGNOSIS — F250 Schizoaffective disorder, bipolar type: Secondary | ICD-10-CM | POA: Diagnosis not present

## 2016-03-16 DIAGNOSIS — F250 Schizoaffective disorder, bipolar type: Secondary | ICD-10-CM | POA: Diagnosis not present

## 2016-03-22 DIAGNOSIS — F250 Schizoaffective disorder, bipolar type: Secondary | ICD-10-CM | POA: Diagnosis not present

## 2016-04-25 DIAGNOSIS — F250 Schizoaffective disorder, bipolar type: Secondary | ICD-10-CM | POA: Diagnosis not present

## 2016-05-25 DIAGNOSIS — F250 Schizoaffective disorder, bipolar type: Secondary | ICD-10-CM | POA: Diagnosis not present

## 2016-06-22 DIAGNOSIS — F250 Schizoaffective disorder, bipolar type: Secondary | ICD-10-CM | POA: Diagnosis not present

## 2016-07-26 DIAGNOSIS — F250 Schizoaffective disorder, bipolar type: Secondary | ICD-10-CM | POA: Diagnosis not present

## 2016-08-16 DIAGNOSIS — F250 Schizoaffective disorder, bipolar type: Secondary | ICD-10-CM | POA: Diagnosis not present

## 2016-08-29 DIAGNOSIS — F250 Schizoaffective disorder, bipolar type: Secondary | ICD-10-CM | POA: Diagnosis not present

## 2016-09-26 DIAGNOSIS — F250 Schizoaffective disorder, bipolar type: Secondary | ICD-10-CM | POA: Diagnosis not present

## 2016-10-24 DIAGNOSIS — F250 Schizoaffective disorder, bipolar type: Secondary | ICD-10-CM | POA: Diagnosis not present

## 2016-12-19 DIAGNOSIS — F250 Schizoaffective disorder, bipolar type: Secondary | ICD-10-CM | POA: Diagnosis not present

## 2017-01-02 DIAGNOSIS — F250 Schizoaffective disorder, bipolar type: Secondary | ICD-10-CM | POA: Diagnosis not present

## 2017-01-27 ENCOUNTER — Emergency Department (HOSPITAL_COMMUNITY)
Admission: EM | Admit: 2017-01-27 | Discharge: 2017-01-27 | Disposition: A | Discharge status: Home / Self Care | Payer: Medicare Other | Attending: Emergency Medicine | Admitting: Emergency Medicine

## 2017-01-27 ENCOUNTER — Encounter (HOSPITAL_COMMUNITY): Payer: Self-pay | Admitting: Emergency Medicine

## 2017-01-27 ENCOUNTER — Emergency Department (HOSPITAL_COMMUNITY): Payer: Medicare Other

## 2017-01-27 DIAGNOSIS — M216X1 Other acquired deformities of right foot: Secondary | ICD-10-CM | POA: Diagnosis not present

## 2017-01-27 DIAGNOSIS — M2142 Flat foot [pes planus] (acquired), left foot: Secondary | ICD-10-CM | POA: Diagnosis not present

## 2017-01-27 DIAGNOSIS — R600 Localized edema: Secondary | ICD-10-CM | POA: Insufficient documentation

## 2017-01-27 DIAGNOSIS — M2141 Flat foot [pes planus] (acquired), right foot: Secondary | ICD-10-CM | POA: Diagnosis not present

## 2017-01-27 DIAGNOSIS — M79671 Pain in right foot: Secondary | ICD-10-CM

## 2017-01-27 DIAGNOSIS — M216X2 Other acquired deformities of left foot: Secondary | ICD-10-CM | POA: Diagnosis not present

## 2017-01-27 DIAGNOSIS — Z79899 Other long term (current) drug therapy: Secondary | ICD-10-CM | POA: Insufficient documentation

## 2017-01-27 DIAGNOSIS — F17200 Nicotine dependence, unspecified, uncomplicated: Secondary | ICD-10-CM | POA: Insufficient documentation

## 2017-01-27 DIAGNOSIS — R52 Pain, unspecified: Secondary | ICD-10-CM | POA: Diagnosis not present

## 2017-01-27 DIAGNOSIS — M7989 Other specified soft tissue disorders: Secondary | ICD-10-CM | POA: Diagnosis not present

## 2017-01-27 MED ORDER — IBUPROFEN 200 MG PO TABS
600.0000 mg | ORAL_TABLET | Freq: Once | ORAL | Status: AC
  Administered 2017-01-27: 600 mg via ORAL
  Filled 2017-01-27: qty 3

## 2017-01-27 NOTE — ED Triage Notes (Signed)
Pt from home via EMS with complaints of rt foot pain x 3 days. Pt denies trauma or injury. Per EMS there is no notable swelling to the area. Pt is able to ambulate.

## 2017-01-27 NOTE — Discharge Instructions (Signed)
Read the information below.  You may return to the Emergency Department at any time for worsening condition or any new symptoms that concern you.   If you develop uncontrolled pain, weakness or numbness of the extremity, severe discoloration of the skin, or you are unable to walk, return to the ER for a recheck.    °

## 2017-01-27 NOTE — ED Provider Notes (Signed)
[facility]-EMERGENCY DEPT Provider Note   CSN: [id number] Arrival date & time: 01/27/17  1609   By signing my name below, I, Clarisse GougeXavier Herndon, attest that this documentation has been prepared under the direction and in the presence of [facility] CenterEmily Croy Drumwright, PA-C. Electronically Signed: Clarisse GougeXavier Herndon, Scribe. 01/27/17. 4:31 PM.   History   Chief Complaint Chief Complaint  Patient presents with  . Foot Pain   The history is provided by the patient and medical records. No language interpreter was used.    Steven Clayton is a 36 y.o. male BIB EMS to the Emergency Department with chief complaint of gradually worsening upper R foot pain x > 3 days. He notes associated swelling near the R ankle. Pt walks a lot and has to climb a big hill every day but denies any injury.  He describes moderate to severe, constant, aching pain worse with walking. No recent traumas noted to affected foot; no new shoes recently. No other complaints at this time.   Past Medical History:  Diagnosis Date  . Schizoaffective disorder [facility](HCC)     Patient Active Problem List   Diagnosis Date Noted  . Iron deficiency anemia 01/20/2015  . Schizoaffective disorder, bipolar type (HCC) 01/19/2015  . Tobacco use disorder, moderate, dependence 01/19/2015    History reviewed. No pertinent surgical history.     Home Medications    Prior to Admission medications   Medication Sig Start Date End Date Taking? Authorizing Provider  benztropine (COGENTIN) 0.5 MG tablet Take 1 tablet (0.5 mg total) by mouth daily. 01/25/15   Thermon Leylandavis, Laura A, NP  benztropine (COGENTIN) 0.5 MG tablet Take 1 tablet (0.5 mg total) by mouth at bedtime. 01/25/15   Thermon Leylandavis, Laura A, NP  divalproex (DEPAKOTE ER) 500 MG 24 hr tablet Take 1 tablet (500 mg total) by mouth 2 (two) times daily. For mood control. 01/25/15   Thermon Leylandavis, Laura A, NP  ferrous sulfate 325 (65 FE) MG tablet Take 1 tablet (325 mg total) by mouth 2 (two) times daily with a meal. 01/25/15   Thermon Leylandavis, Laura A, NP    lisinopril-hydrochlorothiazide (PRINZIDE,ZESTORETIC) 20-12.5 MG per tablet Take 1 tablet by mouth daily. 01/25/15   Thermon Leylandavis, Laura A, NP  nicotine (NICODERM CQ - DOSED IN MG/24 HOURS) 14 mg/24hr patch Place 1 patch (14 mg total) onto the skin daily. 01/25/15   Thermon Leylandavis, Laura A, NP  risperiDONE (RISPERDAL) 2 MG tablet Take 1 tablet (2 mg total) by mouth daily. 01/25/15   Thermon Leylandavis, Laura A, NP  risperiDONE (RISPERDAL) 4 MG tablet Take 1 tablet (4 mg total) by mouth at bedtime. 01/25/15   Thermon Leylandavis, Laura A, NP    Family History Family History  Problem Relation Age of Onset  . Mental illness Neg Hx     Social History Social History  Substance Use Topics  . Smoking status: Current Some Day Smoker  . Smokeless tobacco: Never Used  . Alcohol use 0.6 oz/week    1 Glasses of wine per week     Allergies   Patient has no known allergies.   Review of Systems Review of Systems  Constitutional: Negative for fever.  Musculoskeletal: Positive for arthralgias, gait problem and joint swelling.  Skin: Negative for color change and wound.  Neurological: Negative for weakness and numbness.  Hematological: Does not bruise/bleed easily.  Psychiatric/Behavioral: Negative for self-injury.  All other systems reviewed and are negative.    Physical Exam Updated Vital Signs BP 136/87 (BP Location: Right Arm)   Pulse 88  Temp 98.5 F (36.9 C) (Oral)   Resp 18   SpO2 99%   Physical Exam  Constitutional: He appears well-developed and well-nourished.  HENT:  Head: Normocephalic and atraumatic.  Neck: Neck supple.  Pulmonary/Chest: Effort normal.  Musculoskeletal: Normal range of motion. He exhibits tenderness.  R proximal dorsal foot with soft tissue swelling vs fatty tissue indicated as the area of pain. No focal tenderness. No erythema or warmth. Moves toes and ankle normally. No focal bony tenderness. No break in the skin.  Neurological: He is alert.  Nursing note and vitals reviewed.    ED Treatments  / Results  DIAGNOSTIC STUDIES:   COORDINATION OF CARE: 4:25 PM-Discussed next steps with pt. Pt verbalized understanding and is agreeable with the plan. Will order imaging.   Labs (all labs ordered are listed, but only abnormal results are displayed) Labs Reviewed - No data to display  EKG  EKG Interpretation None       Radiology Dg Foot Complete Right  Result Date: 01/27/2017 CLINICAL DATA:  Right foot pain and swelling for 3 days. No known injury. Initial encounter. EXAM: RIGHT FOOT COMPLETE - 3+ VIEW COMPARISON:  06/14/2007 radiograph FINDINGS: No acute fracture, subluxation or dislocation identified. No focal bony lesions are present. The Lisfranc joints are unremarkable. Mild dorsal soft tissue swelling is present. IMPRESSION: Soft tissue swelling without bony abnormality. Electronically Signed   By: Harmon Pier M.D.   On: 01/27/2017 16:51    Procedures Procedures (including critical care time)  Medications Ordered in ED Medications  ibuprofen (ADVIL,MOTRIN) tablet 600 mg (600 mg Oral Given 01/27/17 1731)     Initial Impression / Assessment and Plan / ED Course  I have reviewed the triage vital signs and the nursing notes.  Pertinent labs & imaging results that were available during my care of the patient were reviewed by me and considered in my medical decision making (see chart for details).     Afebrile, nontoxic patient with pain in the right foot without injury.  No e/o infection clinically.  Doubt septic joint or gout.  Xray negative.    D/C home with ace wrap, crutches, PCP follow up.  Discussed result, findings, treatment, and follow up  with patient.  Pt given return precautions.  Pt verbalizes understanding and agrees with plan.      Final Clinical Impressions(s) / ED Diagnoses   Final diagnoses:  Foot pain, right  Pes planus of both feet    New Prescriptions Discharge Medication List as of 01/27/2017  5:19 PM      I personally performed the services  described in this documentation, which was scribed in my presence. The recorded information has been reviewed and is accurate.    Trixie Dredge, PA-C 01/27/17 1859    Tilden Fossa, MD 01/28/17 [phone]

## 2017-03-21 DIAGNOSIS — F250 Schizoaffective disorder, bipolar type: Secondary | ICD-10-CM | POA: Diagnosis not present

## 2017-04-03 DIAGNOSIS — F250 Schizoaffective disorder, bipolar type: Secondary | ICD-10-CM | POA: Diagnosis not present

## 2017-04-04 ENCOUNTER — Emergency Department (HOSPITAL_COMMUNITY)
Admission: EM | Admit: 2017-04-04 | Discharge: 2017-04-04 | Disposition: A | Discharge status: Home / Self Care | Payer: Medicare Other | Attending: Emergency Medicine | Admitting: Emergency Medicine

## 2017-04-04 ENCOUNTER — Encounter (HOSPITAL_COMMUNITY): Payer: Self-pay

## 2017-04-04 DIAGNOSIS — Z760 Encounter for issue of repeat prescription: Secondary | ICD-10-CM | POA: Insufficient documentation

## 2017-04-04 DIAGNOSIS — I10 Essential (primary) hypertension: Secondary | ICD-10-CM | POA: Insufficient documentation

## 2017-04-04 DIAGNOSIS — F17200 Nicotine dependence, unspecified, uncomplicated: Secondary | ICD-10-CM | POA: Diagnosis not present

## 2017-04-04 DIAGNOSIS — R030 Elevated blood-pressure reading, without diagnosis of hypertension: Secondary | ICD-10-CM | POA: Diagnosis not present

## 2017-04-04 MED ORDER — LISINOPRIL-HYDROCHLOROTHIAZIDE 20-12.5 MG PO TABS: 1.0000 | ORAL_TABLET | Freq: Every day | ORAL | 0 refills | Status: DC

## 2017-04-04 NOTE — ED Provider Notes (Signed)
[facility]-EMERGENCY DEPT Provider Note   CSN: [id number] Arrival date & time: 04/04/17  2134     History   Chief Complaint Chief Complaint  Patient presents with  . Medication Refill    HPI Daniel Noneshomas Blevens is a 36 y.o. male.  HPI  36 y.o. male with a hx of Schizoaffective Disorder, presents to the Emergency Department today requesting medication refill. Pt took Lisinopril. Pt previous PCP left practice. Unable to refill medication. No CP/SOB/ABD pain. No headaches. No visual changes. No N/V. No numbness/tingling. Asymptomatic currently. Pt also requesting help to find PCP. No other symptoms noted.    Past Medical History:  Diagnosis Date  . Schizoaffective disorder [facility](HCC)     Patient Active Problem List   Diagnosis Date Noted  . Iron deficiency anemia 01/20/2015  . Schizoaffective disorder, bipolar type (HCC) 01/19/2015  . Tobacco use disorder, moderate, dependence 01/19/2015    History reviewed. No pertinent surgical history.     Home Medications    Prior to Admission medications   Medication Sig Start Date End Date Taking? Authorizing Provider  benztropine (COGENTIN) 0.5 MG tablet Take 1 tablet (0.5 mg total) by mouth daily. 01/25/15   Thermon Leylandavis, Laura A, NP  benztropine (COGENTIN) 0.5 MG tablet Take 1 tablet (0.5 mg total) by mouth at bedtime. 01/25/15   Thermon Leylandavis, Laura A, NP  divalproex (DEPAKOTE ER) 500 MG 24 hr tablet Take 1 tablet (500 mg total) by mouth 2 (two) times daily. For mood control. 01/25/15   Thermon Leylandavis, Laura A, NP  ferrous sulfate 325 (65 FE) MG tablet Take 1 tablet (325 mg total) by mouth 2 (two) times daily with a meal. 01/25/15   Thermon Leylandavis, Laura A, NP  lisinopril-hydrochlorothiazide (PRINZIDE,ZESTORETIC) 20-12.5 MG per tablet Take 1 tablet by mouth daily. 01/25/15   Thermon Leylandavis, Laura A, NP  nicotine (NICODERM CQ - DOSED IN MG/24 HOURS) 14 mg/24hr patch Place 1 patch (14 mg total) onto the skin daily. 01/25/15   Thermon Leylandavis, Laura A, NP  risperiDONE (RISPERDAL) 2 MG tablet Take 1 tablet (2  mg total) by mouth daily. 01/25/15   Thermon Leylandavis, Laura A, NP  risperiDONE (RISPERDAL) 4 MG tablet Take 1 tablet (4 mg total) by mouth at bedtime. 01/25/15   Thermon Leylandavis, Laura A, NP    Family History Family History  Problem Relation Age of Onset  . Mental illness Neg Hx     Social History Social History  Substance Use Topics  . Smoking status: Current Some Day Smoker  . Smokeless tobacco: Never Used  . Alcohol use 0.6 oz/week    1 Glasses of wine per week     Allergies   Patient has no known allergies.   Review of Systems Review of Systems  Constitutional: Negative for fever.  Eyes: Negative for visual disturbance.  Respiratory: Negative for cough and shortness of breath.   Cardiovascular: Negative for chest pain.  Gastrointestinal: Negative for diarrhea, nausea and vomiting.  Allergic/Immunologic: Negative for immunocompromised state.  Neurological: Negative for headaches.     Physical Exam Updated Vital Signs BP (!) 159/116 (BP Location: Right Arm)   Pulse 78   Temp 98.7 F (37.1 C) (Oral)   Resp 18   SpO2 96%   Physical Exam  Constitutional: He is oriented to person, place, and time. Vital signs are normal. He appears well-developed and well-nourished.  HENT:  Head: Normocephalic and atraumatic.  Right Ear: Hearing normal.  Left Ear: Hearing normal.  Eyes: Pupils are equal, round, and reactive to light. Conjunctivae and  EOM are normal.  Neck: Normal range of motion. Neck supple.  Cardiovascular: Normal rate, regular rhythm, normal heart sounds and intact distal pulses.   Pulmonary/Chest: Effort normal and breath sounds normal.  Musculoskeletal: Normal range of motion.  Neurological: He is alert and oriented to person, place, and time.  Skin: Skin is warm and dry.  Psychiatric: He has a normal mood and affect. His speech is normal and behavior is normal. Thought content normal.  Nursing note and vitals reviewed.  ED Treatments / Results  Labs (all labs ordered are  listed, but only abnormal results are displayed) Labs Reviewed - No data to display  EKG  EKG Interpretation None       Radiology No results found.  Procedures Procedures (including critical care time)  Medications Ordered in ED Medications - No data to display   Initial Impression / Assessment and Plan / ED Course  I have reviewed the triage vital signs and the nursing notes.  Pertinent labs & imaging results that were available during my care of the patient were reviewed by me and considered in my medical decision making (see chart for details).  Final Clinical Impressions(s) / ED Diagnoses     {I have reviewed the relevant previous healthcare records.  {I obtained HPI from historian.   ED Course:  Assessment: Pt is a 36 y.o. male with a hx of Schizoaffective Disorder, presents to the Emergency Department today requesting medication refill. Pt took Lisinopril. Pt previous PCP left practice. Unable to refill medication. No CP/SOB/ABD pain. No headaches. No visual changes. No N/V. No numbness/tingling. Asymptomatic currently. Pt also requesting help to find PCP.Marland Kitchen On exam, pt in NAD. Nontoxic/nonseptic appearing. VSS. Afebrile. Lungs CTA. Heart RRR. Refilled medication. Consult to Case management to help with PCP. Appears patient tried with minimal success. Plan is to DC home with follow up to PCP. Counseled on DASH diet and logging BPs at home.. At time of discharge, Patient is in no acute distress. Vital Signs are stable. Patient is able to ambulate. Patient able to tolerate PO.      Disposition/Plan:  DC Home Additional Verbal discharge instructions given and discussed with patient.  Pt Instructed to f/u with PCP in the next week for evaluation and treatment of symptoms. Return precautions given Pt acknowledges and agrees with plan  Supervising Physician Alvira Monday, MD  Final diagnoses:  Medication refill  Hypertension, unspecified type    New  Prescriptions New Prescriptions   No medications on file     Audry Pili, Cordelia Poche 04/04/17 2238    Alvira Monday, MD 04/06/17 0003

## 2017-04-04 NOTE — ED Triage Notes (Signed)
Pt states he needs his lisinopril refilled and he's concerned about the medication not working well and needing adjusted

## 2017-04-05 ENCOUNTER — Telehealth: Payer: Self-pay | Admitting: Emergency Medicine

## 2017-04-05 NOTE — Telephone Encounter (Signed)
CM consulted for PCP need due to HTN.  Called pt and discussed the 3 clinic options with him [facility]  Appointment scheduled for Tues Aug 21th at 1:00 pm.  Called pt back with appointment time and location information.  No further CM needs noted at this time.

## 2017-04-10 ENCOUNTER — Ambulatory Visit: Payer: Self-pay | Admitting: Family Medicine

## 2017-06-19 DIAGNOSIS — F250 Schizoaffective disorder, bipolar type: Secondary | ICD-10-CM | POA: Diagnosis not present

## 2017-06-26 DIAGNOSIS — F250 Schizoaffective disorder, bipolar type: Secondary | ICD-10-CM | POA: Diagnosis not present

## 2017-07-09 DIAGNOSIS — Z5181 Encounter for therapeutic drug level monitoring: Secondary | ICD-10-CM | POA: Diagnosis not present

## 2017-07-11 DIAGNOSIS — Z5181 Encounter for therapeutic drug level monitoring: Secondary | ICD-10-CM | POA: Diagnosis not present

## 2017-07-14 IMAGING — CR DG FOOT COMPLETE 3+V*R*
3 series · 3 of 3 positions shown · non-contrast
Comparison: 06/14/2007 radiograph

CLINICAL DATA: Right foot pain and swelling for 3 days. No known
injury. Initial encounter.

EXAM:
RIGHT FOOT COMPLETE - 3+ VIEW

[x foot ap right]
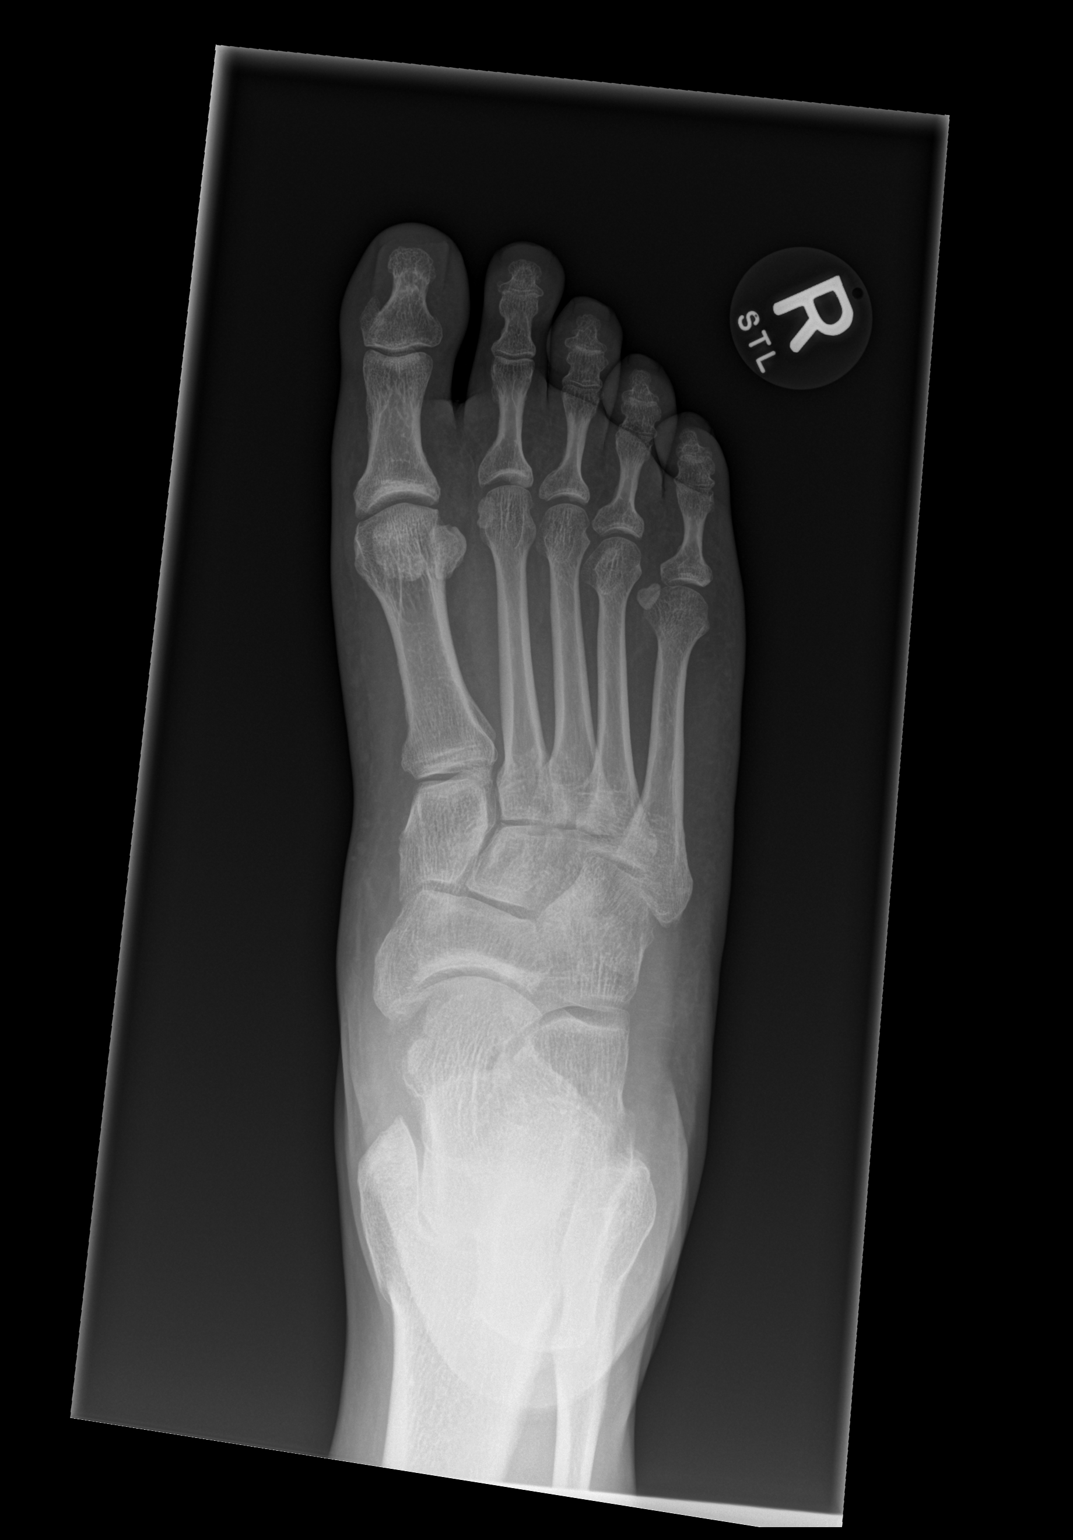

[x foot obl right]
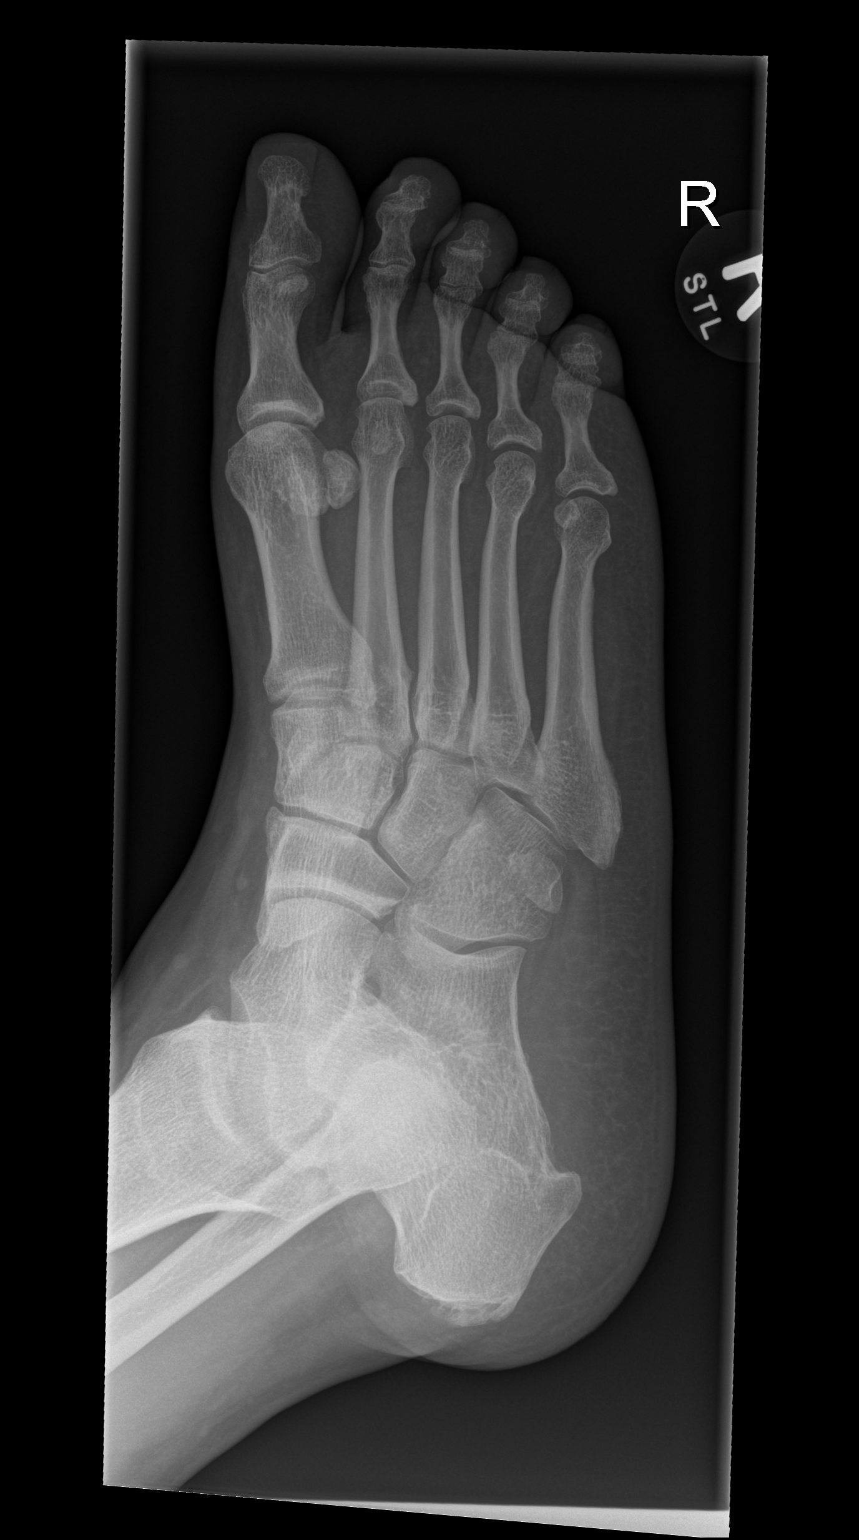

[x foot lat right]
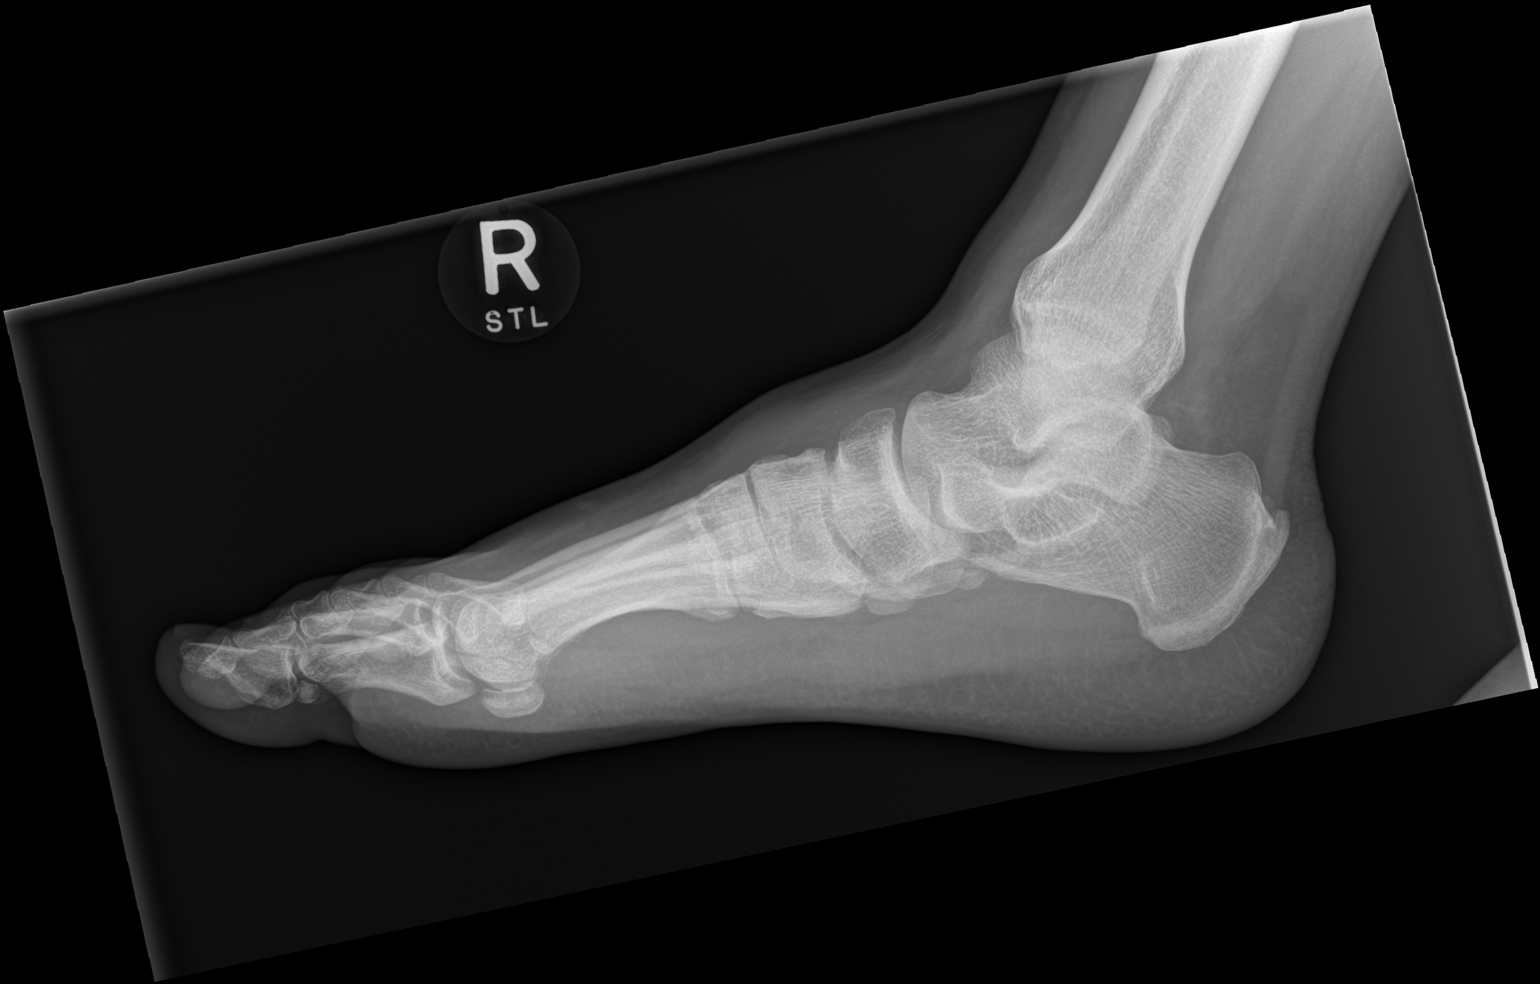

[3 of 3 positions shown; findings below may reference images not displayed]

FINDINGS: No acute fracture, subluxation or dislocation identified.

No focal bony lesions are present.

The Lisfranc joints are unremarkable.

Mild dorsal soft tissue swelling is present.
IMPRESSION: Soft tissue swelling without bony abnormality.

## 2017-07-17 DIAGNOSIS — Z5181 Encounter for therapeutic drug level monitoring: Secondary | ICD-10-CM | POA: Diagnosis not present

## 2017-07-19 DIAGNOSIS — Z5181 Encounter for therapeutic drug level monitoring: Secondary | ICD-10-CM | POA: Diagnosis not present

## 2017-08-01 DIAGNOSIS — Z5181 Encounter for therapeutic drug level monitoring: Secondary | ICD-10-CM | POA: Diagnosis not present

## 2017-08-03 DIAGNOSIS — Z5181 Encounter for therapeutic drug level monitoring: Secondary | ICD-10-CM | POA: Diagnosis not present

## 2017-09-26 DIAGNOSIS — F250 Schizoaffective disorder, bipolar type: Secondary | ICD-10-CM | POA: Diagnosis not present

## 2017-10-16 DIAGNOSIS — F250 Schizoaffective disorder, bipolar type: Secondary | ICD-10-CM | POA: Diagnosis not present

## 2017-10-22 DIAGNOSIS — Z23 Encounter for immunization: Secondary | ICD-10-CM | POA: Diagnosis not present

## 2017-12-19 DIAGNOSIS — F250 Schizoaffective disorder, bipolar type: Secondary | ICD-10-CM | POA: Diagnosis not present

## 2017-12-24 ENCOUNTER — Encounter: Payer: Self-pay | Admitting: Physician Assistant

## 2017-12-24 ENCOUNTER — Other Ambulatory Visit: Payer: Self-pay

## 2017-12-24 ENCOUNTER — Ambulatory Visit (INDEPENDENT_AMBULATORY_CARE_PROVIDER_SITE_OTHER): Payer: Medicare Other | Admitting: Physician Assistant

## 2017-12-24 VITALS — BP 110/78 | HR 68 | Temp 98.4°F | Ht 69.0 in | Wt 260.4 lb

## 2017-12-24 DIAGNOSIS — I10 Essential (primary) hypertension: Secondary | ICD-10-CM

## 2017-12-24 DIAGNOSIS — Z130 Encounter for screening for diseases of the blood and blood-forming organs and certain disorders involving the immune mechanism: Secondary | ICD-10-CM | POA: Diagnosis not present

## 2017-12-24 DIAGNOSIS — F17210 Nicotine dependence, cigarettes, uncomplicated: Secondary | ICD-10-CM | POA: Diagnosis not present

## 2017-12-24 DIAGNOSIS — Z1321 Encounter for screening for nutritional disorder: Secondary | ICD-10-CM

## 2017-12-24 DIAGNOSIS — Z13228 Encounter for screening for other metabolic disorders: Secondary | ICD-10-CM | POA: Diagnosis not present

## 2017-12-24 DIAGNOSIS — Z1329 Encounter for screening for other suspected endocrine disorder: Secondary | ICD-10-CM | POA: Diagnosis not present

## 2017-12-24 MED ORDER — NICOTINE 14 MG/24HR TD PT24: 14.0000 mg | MEDICATED_PATCH | Freq: Every day | TRANSDERMAL | 6 refills | Status: DC

## 2017-12-24 MED ORDER — LISINOPRIL-HYDROCHLOROTHIAZIDE 20-12.5 MG PO TABS: 1.0000 | ORAL_TABLET | Freq: Every day | ORAL | 1 refills | Status: DC

## 2017-12-24 NOTE — Patient Instructions (Addendum)
You appear to be in very good health today. Please pick up the BP and nicotine medications.  Please continue seeing your current psychiatrist for your other medications.   I'll call if there is a problem with your labs.  IF not problems then you will get a copy in a few weeks. It was great to meet you today.     IF you received an x-ray today, you will receive an invoice from [facility]. Please contact [facility] at [phone] with questions or concerns regarding your invoice.   IF you received labwork today, you will receive an invoice from [facility]. Please contact [facility] at [phone] with questions or concerns regarding your invoice.   Our billing staff will not be able to assist you with questions regarding bills from these companies.  You will be contacted with the lab results as soon as they are available. The fastest way to get your results is to activate your My Chart account. Instructions are located on the last page of this paperwork. If you have not heard from [place] regarding the results in 2 weeks, please contact this office.

## 2017-12-24 NOTE — Progress Notes (Signed)
12/24/2017 4:43 PM   DOB: Nov 04, 1980 / MRN: [id number]  SUBJECTIVE:  Steven Clayton is a 37 y.o. male presenting for hypertension. Requesting refills of nicoderm as well.  Feels well today and denies complaint.  He has a history of schizophrenia. Most recent labs roughly 3 years ago.   Current Outpatient Medications:  .  benztropine (COGENTIN) 0.5 MG tablet, Take 1 tablet (0.5 mg total) by mouth at bedtime., Disp: 30 tablet, Rfl: 0 .  divalproex (DEPAKOTE ER) 500 MG 24 hr tablet, Take 1 tablet (500 mg total) by mouth 2 (two) times daily. For mood control., Disp: 60 tablet, Rfl: 0 .  lisinopril-hydrochlorothiazide (PRINZIDE,ZESTORETIC) 20-12.5 MG tablet, Take 1 tablet by mouth daily., Disp: 90 tablet, Rfl: 1 .  nicotine (NICODERM CQ - DOSED IN MG/24 HOURS) 14 mg/24hr patch, Place 1 patch (14 mg total) onto the skin daily., Disp: 28 patch, Rfl: 6 .  Paliperidone Palmitate (INVEGA TRINZA IM), Inject 37.5 mLs into the muscle. Every 14 days, Disp: , Rfl:     He has No Known Allergies.   He  has a past medical history of Anxiety, Depression, Schizoaffective disorder (HCC), and Stroke (HCC).    He  reports that he has been smoking cigarettes.  He has been smoking about 2.00 packs per day. He has never used smokeless tobacco. He reports that he drinks about 0.6 oz of alcohol per week. He reports that he does not use drugs. He  reports that he does not engage in sexual activity. The patient  has no past surgical history on file.  His family history includes Heart disease in his mother; Hyperlipidemia in his mother; Mental illness in his father.  Review of Systems  Constitutional: Negative for chills, diaphoresis and fever.  Eyes: Negative.   Respiratory: Negative for cough, hemoptysis, sputum production, shortness of breath and wheezing.   Cardiovascular: Negative for chest pain, orthopnea and leg swelling.  Gastrointestinal: Negative for abdominal pain, blood in stool, constipation, diarrhea,  heartburn, melena, nausea and vomiting.  Genitourinary: Negative for dysuria, flank pain, frequency, hematuria and urgency.  Skin: Negative for rash.  Neurological: Negative for dizziness, sensory change, speech change, focal weakness and headaches.    The problem list and medications were reviewed and updated by myself where necessary and exist elsewhere in the encounter.   OBJECTIVE:  Pulse 68   Temp 98.4 F (36.9 C) (Oral)   Ht  (1.753 m)   Wt 260 lb 6.4 oz (118.1 kg)   SpO2 99%   BMI 38.45 kg/m   Wt Readings from Last 3 Encounters:  12/24/17 260 lb 6.4 oz (118.1 kg)   Temp Readings from Last 3 Encounters:  12/24/17 98.4 F (36.9 C) (Oral)  04/04/17 98.7 F (37.1 C) (Oral)  01/27/17 98.5 F (36.9 C) (Oral)   BP Readings from Last 3 Encounters:  04/04/17 (!) 157/119  01/27/17 136/87  01/18/15 133/73   Pulse Readings from Last 3 Encounters:  12/24/17 68  04/04/17 73  01/27/17 88     Physical Exam  Constitutional: He is oriented to person, place, and time. He appears well-developed. He is active and cooperative.  Non-toxic appearance. He does not appear ill.  Eyes: Pupils are equal, round, and reactive to light. Conjunctivae and EOM are normal.  Cardiovascular: Normal rate, regular rhythm, S1 normal, S2 normal, normal heart sounds, intact distal pulses and normal pulses. Exam reveals no gallop and no friction rub.  No murmur heard. Pulmonary/Chest: Effort normal. No stridor.  No tachypnea. No respiratory distress. He has no wheezes. He has no rales.  Abdominal: He exhibits no distension.  Musculoskeletal: Normal range of motion. He exhibits no edema.  Neurological: He is alert and oriented to person, place, and time. He has normal strength and normal reflexes. He is not disoriented. No cranial nerve deficit or sensory deficit. He exhibits normal muscle tone. Coordination and gait normal.  Skin: Skin is warm and dry. He is not diaphoretic. No pallor.    Psychiatric: He has a normal mood and affect. His behavior is normal.  Nursing note and vitals reviewed.   Lab Results  Component Value Date   WBC 9.0 01/18/2015   HGB 12.8 (L) 01/18/2015   HCT 37.5 (L) 01/18/2015   MCV 86.0 01/18/2015   PLT 211 01/18/2015    Lab Results  Component Value Date   CREATININE 0.92 01/20/2015   BUN 11 01/20/2015   NA 139 01/20/2015   K 4.2 01/20/2015   CL 105 01/20/2015   CO2 27 01/20/2015    Lab Results  Component Value Date   ALT 15 (L) 01/18/2015   AST 41 01/18/2015   ALKPHOS 68 01/18/2015   BILITOT 0.4 01/18/2015    Lab Results  Component Value Date   TSH 1.896 01/19/2015    Lab Results  Component Value Date   HGBA1C 5.6 01/20/2015    Lab Results  Component Value Date   CHOL 135 01/20/2015   HDL 37 (L) 01/20/2015   LDLCALC 80 01/20/2015   TRIG 90 01/20/2015   CHOLHDL 3.6 01/20/2015   No results found for this or any previous visit (from the past 72 hour(s)).  No results found.  ASSESSMENT AND PLAN:  Steven Clayton was seen today for establish care and medication refill.  Diagnoses and all orders for this visit:  Essential hypertension: Pleasant gentleman here today for refills of lisinopril hydrochlorothiazide and requesting smoking cessation patches.  I have sent these over.  I will screen basic lab work today.  Advised to return in 6 months for recheck of both problems.  He will continue seeing psychiatry for his other medications today. -     CBC -     Basic metabolic panel -     Lipid panel -     Hepatic function panel -     lisinopril-hydrochlorothiazide (PRINZIDE,ZESTORETIC) 20-12.5 MG tablet; Take 1 tablet by mouth daily. -     Care order/instruction:  Cigarette nicotine dependence without complication -     nicotine (NICODERM CQ - DOSED IN MG/24 HOURS) 14 mg/24hr patch; Place 1 patch (14 mg total) onto the skin daily.    The patient is advised to call or return to clinic if he does not see an improvement in  symptoms, or to seek the care of the closest emergency department if he worsens with the above plan.   Deliah Boston, MHS, PA-C Primary Care at [facility] 12/24/2017 4:43 PM

## 2017-12-25 LAB — LIPID PANEL
CHOL/HDL RATIO: 4.6 ratio (ref 0.0–5.0)
Cholesterol, Total: 181 mg/dL (ref 100–199)
HDL: 39 mg/dL — AB (ref >39)
LDL Calculated: 115 mg/dL — ABNORMAL HIGH (ref 0–99)
TRIGLYCERIDES: 135 mg/dL (ref 0–149)
VLDL Cholesterol Cal: 27 mg/dL (ref 5–40)

## 2017-12-25 LAB — BASIC METABOLIC PANEL
BUN/Creatinine Ratio: 10 (ref 9–20)
BUN: 12 mg/dL (ref 6–20)
CO2: 22 mmol/L (ref 20–29)
CREATININE: 1.19 mg/dL (ref 0.76–1.27)
Calcium: 10 mg/dL (ref 8.7–10.2)
Chloride: 100 mmol/L (ref 96–106)
GFR calc Af Amer: 90 mL/min/{1.73_m2} (ref >59)
GFR, EST NON AFRICAN AMERICAN: 78 mL/min/{1.73_m2} (ref >59)
Glucose: 78 mg/dL (ref 65–99)
Potassium: 4.5 mmol/L (ref 3.5–5.2)
SODIUM: 138 mmol/L (ref 134–144)

## 2017-12-25 LAB — CBC
HEMOGLOBIN: 13.1 g/dL (ref 13.0–17.7)
Hematocrit: 40.4 % (ref 37.5–51.0)
MCH: 28.8 pg (ref 26.6–33.0)
MCHC: 32.4 g/dL (ref 31.5–35.7)
MCV: 89 fL (ref 79–97)
Platelets: 266 10*3/uL (ref 150–379)
RBC: 4.55 x10E6/uL (ref 4.14–5.80)
RDW: 15.5 % — ABNORMAL HIGH (ref 12.3–15.4)
WBC: 5.9 10*3/uL (ref 3.4–10.8)

## 2017-12-25 LAB — HEPATIC FUNCTION PANEL
ALT: 18 IU/L (ref 0–44)
AST: 17 IU/L (ref 0–40)
Albumin: 4.4 g/dL (ref 3.5–5.5)
Alkaline Phosphatase: 86 IU/L (ref 39–117)
BILIRUBIN TOTAL: 0.3 mg/dL (ref 0.0–1.2)
Bilirubin, Direct: 0.09 mg/dL (ref 0.00–0.40)
Total Protein: 7.3 g/dL (ref 6.0–8.5)

## 2018-02-20 ENCOUNTER — Encounter

## 2018-03-13 DIAGNOSIS — F250 Schizoaffective disorder, bipolar type: Secondary | ICD-10-CM | POA: Diagnosis not present

## 2018-03-20 ENCOUNTER — Telehealth: Payer: Self-pay | Admitting: Physician Assistant

## 2018-06-05 DIAGNOSIS — F250 Schizoaffective disorder, bipolar type: Secondary | ICD-10-CM | POA: Diagnosis not present

## 2018-06-13 NOTE — Telephone Encounter (Signed)
DONE

## 2018-06-28 ENCOUNTER — Ambulatory Visit: Payer: Medicare Other | Admitting: Physician Assistant

## 2018-07-02 ENCOUNTER — Ambulatory Visit: Payer: Medicare Other | Admitting: Emergency Medicine

## 2018-08-28 DIAGNOSIS — F250 Schizoaffective disorder, bipolar type: Secondary | ICD-10-CM | POA: Diagnosis not present

## 2018-11-26 DIAGNOSIS — F250 Schizoaffective disorder, bipolar type: Secondary | ICD-10-CM | POA: Diagnosis not present

## 2019-02-20 DIAGNOSIS — F250 Schizoaffective disorder, bipolar type: Secondary | ICD-10-CM | POA: Diagnosis not present

## 2019-05-14 DIAGNOSIS — F250 Schizoaffective disorder, bipolar type: Secondary | ICD-10-CM | POA: Diagnosis not present

## 2019-05-15 ENCOUNTER — Ambulatory Visit: Payer: Self-pay | Admitting: *Deleted

## 2019-05-15 DIAGNOSIS — F250 Schizoaffective disorder, bipolar type: Secondary | ICD-10-CM | POA: Diagnosis not present

## 2019-05-15 NOTE — Telephone Encounter (Signed)
Pt called in wanting an appt with a provider at [place] at [facility] due to his BP being elevated.yesterday when he was checked at [facility] [place].   He has been out of his Lisinopril for a month.    "My mother passed away and I got off schedule".  His BP was 198/150.  Denies any symptoms. "I'm going today to get my shot of [place] for schizophrenia today at 1:20.    The nurse will check my BP then. I checked with Dr. Malachi Paradise at [facility] about my BP and he told me,   "I didn't need to go to the emergency room just call my doctor and get started back on the Lisinopril".  I let him know that was a very high BP that puts him at risk of having a stroke.   He really needed to go to the ED.   He wants to wait and see what his BP is when he gets his shot today.    I gave him the parameters that if his BP was over 160/100-120 to go to the ED.   He agreed to go to the ED if his BP was high when the nurse checks it today. Primary Care at [facility] is also closed 12:00-1:00 so he is going to call back to get an appt so he can get his Lisinopril refilled.    He saw Philis Fendt at [facility] at [facility] May of 2019 was his last visit.  Since he would not go to the ED now I emphasized how important it was to go today after getting his shot if his BP was still elevated.  He agreed to this plan.    Reason for Disposition . [0] Systolic BP  >= 175 OR Diastolic >= 102  AND [5] having NO cardiac or neurologic symptoms  Answer Assessment - Initial Assessment Questions 1. BLOOD PRESSURE: "What is the blood pressure?" "Did you take at least two measurements 5 minutes apart?"     198/150 yesterday    I see the nurse at 1:20 PM today for my shot.     I talked with Dr. Josph Macho at [facility].   He told me to get the BP medication refilled and start taking it. 2. ONSET: "When did you take your blood pressure?"     Yesterday 3. HOW: "How did you obtain the blood pressure?" (e.g., visiting nurse, automatic home BP  monitor)     [facility] [place] 4. HISTORY: "Do you have a history of high blood pressure?"     Yes 5. MEDICATIONS: "Are you taking any medications for blood pressure?" "Have you missed any doses recently?"     Lisinopril but I'm out of it for a month 6. OTHER SYMPTOMS: "Do you have any symptoms?" (e.g., headache, chest pain, blurred vision, difficulty breathing, weakness)     None 7. PREGNANCY: "Is there any chance you are pregnant?" "When was your last menstrual period?"     N/A  Protocols used: HIGH BLOOD PRESSURE-A-AH

## 2019-05-16 ENCOUNTER — Encounter: Payer: Medicare Other | Admitting: Adult Health Nurse Practitioner

## 2021-03-14 ENCOUNTER — Emergency Department (EMERGENCY_DEPARTMENT_HOSPITAL)
Admission: EM | Admit: 2021-03-14 | Discharge: 2021-03-17 | Disposition: A | Discharge status: Home / Self Care | Payer: Medicare Other | Source: Home / Self Care | Attending: Emergency Medicine | Admitting: Emergency Medicine

## 2021-03-14 ENCOUNTER — Ambulatory Visit (INDEPENDENT_AMBULATORY_CARE_PROVIDER_SITE_OTHER)
Admission: EM | Admit: 2021-03-14 | Discharge: 2021-03-14 | Disposition: A | Discharge status: Other Acute Inpatient Hospital | Payer: Medicare Other | Source: Home / Self Care

## 2021-03-14 ENCOUNTER — Encounter (HOSPITAL_COMMUNITY): Payer: Self-pay | Admitting: Emergency Medicine

## 2021-03-14 ENCOUNTER — Other Ambulatory Visit: Payer: Self-pay

## 2021-03-14 ENCOUNTER — Encounter (HOSPITAL_COMMUNITY): Payer: Self-pay | Admitting: Registered Nurse

## 2021-03-14 DIAGNOSIS — Y900 Blood alcohol level of less than 20 mg/100 ml: Secondary | ICD-10-CM | POA: Insufficient documentation

## 2021-03-14 DIAGNOSIS — Z20822 Contact with and (suspected) exposure to covid-19: Secondary | ICD-10-CM | POA: Insufficient documentation

## 2021-03-14 DIAGNOSIS — F17210 Nicotine dependence, cigarettes, uncomplicated: Secondary | ICD-10-CM | POA: Insufficient documentation

## 2021-03-14 DIAGNOSIS — Z79899 Other long term (current) drug therapy: Secondary | ICD-10-CM | POA: Insufficient documentation

## 2021-03-14 DIAGNOSIS — F250 Schizoaffective disorder, bipolar type: Secondary | ICD-10-CM | POA: Diagnosis present

## 2021-03-14 DIAGNOSIS — Z046 Encounter for general psychiatric examination, requested by authority: Secondary | ICD-10-CM

## 2021-03-14 LAB — COMPREHENSIVE METABOLIC PANEL
ALT: 14 U/L (ref 0–44)
AST: 17 U/L (ref 15–41)
Albumin: 4.2 g/dL (ref 3.5–5.0)
Alkaline Phosphatase: 66 U/L (ref 38–126)
Anion gap: 10 (ref 5–15)
BUN: 11 mg/dL (ref 6–20)
CO2: 27 mmol/L (ref 22–32)
Calcium: 9.8 mg/dL (ref 8.9–10.3)
Chloride: 102 mmol/L (ref 98–111)
Creatinine, Ser: 1.31 mg/dL — ABNORMAL HIGH (ref 0.61–1.24)
GFR, Estimated: >60 mL/min (ref >60)
Glucose, Bld: 84 mg/dL (ref 70–99)
Potassium: 3.5 mmol/L (ref 3.5–5.1)
Sodium: 139 mmol/L (ref 135–145)
Total Bilirubin: 0.8 mg/dL (ref 0.3–1.2)
Total Protein: 7.5 g/dL (ref 6.5–8.1)

## 2021-03-14 LAB — CBC
HCT: 41 % (ref 39.0–52.0)
Hemoglobin: 14 g/dL (ref 13.0–17.0)
MCH: 30.8 pg (ref 26.0–34.0)
MCHC: 34.1 g/dL (ref 30.0–36.0)
MCV: 90.1 fL (ref 80.0–100.0)
Platelets: 288 10*3/uL (ref 150–400)
RBC: 4.55 MIL/uL (ref 4.22–5.81)
RDW: 13.5 % (ref 11.5–15.5)
WBC: 5.4 10*3/uL (ref 4.0–10.5)
nRBC: 0 % (ref 0.0–0.2)

## 2021-03-14 LAB — ACETAMINOPHEN LEVEL: Acetaminophen (Tylenol), Serum: <10 ug/mL — ABNORMAL LOW (ref 10–30)

## 2021-03-14 LAB — RESP PANEL BY RT-PCR (FLU A&B, COVID) ARPGX2
Influenza A by PCR: NEGATIVE
Influenza B by PCR: NEGATIVE
SARS Coronavirus 2 by RT PCR: NEGATIVE

## 2021-03-14 LAB — SALICYLATE LEVEL: Salicylate Lvl: <7 mg/dL — ABNORMAL LOW (ref 7.0–30.0)

## 2021-03-14 LAB — ETHANOL: Alcohol, Ethyl (B): <10 mg/dL (ref <10)
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
## 2021-03-14 NOTE — ED Notes (Signed)
Pt refused vitals 

## 2021-03-14 NOTE — Progress Notes (Addendum)
Pt refused Vitals. Unable to complete EMTALA at this time. Pt transferred to [facility] via [employer].
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
## 2021-03-14 NOTE — ED Notes (Signed)
Pts belongings inventoried and placed in locker 12.
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
## 2021-03-15 ENCOUNTER — Encounter (HOSPITAL_COMMUNITY): Payer: Self-pay | Admitting: Registered Nurse

## 2021-03-15 DIAGNOSIS — Z046 Encounter for general psychiatric examination, requested by authority: Secondary | ICD-10-CM | POA: Diagnosis not present

## 2021-03-15 DIAGNOSIS — F250 Schizoaffective disorder, bipolar type: Secondary | ICD-10-CM | POA: Diagnosis not present

## 2021-03-15 LAB — URINALYSIS, ROUTINE W REFLEX MICROSCOPIC
Bilirubin Urine: NEGATIVE
Glucose, UA: NEGATIVE mg/dL
Hgb urine dipstick: NEGATIVE
Ketones, ur: NEGATIVE mg/dL
Leukocytes,Ua: NEGATIVE
Nitrite: NEGATIVE
Protein, ur: NEGATIVE mg/dL
Specific Gravity, Urine: 1.003 — ABNORMAL LOW (ref 1.005–1.030)
pH: 7 (ref 5.0–8.0)

## 2021-03-15 LAB — RAPID URINE DRUG SCREEN, HOSP PERFORMED
Amphetamines: NOT DETECTED
Barbiturates: NOT DETECTED
Benzodiazepines: NOT DETECTED
Cocaine: POSITIVE — AB
Opiates: NOT DETECTED
Tetrahydrocannabinol: POSITIVE — AB

## 2021-03-15 LAB — VALPROIC ACID LEVEL: Valproic Acid Lvl: <10 ug/mL — ABNORMAL LOW (ref 50.0–100.0)

## 2021-03-15 MED ORDER — HYDROCHLOROTHIAZIDE 12.5 MG PO CAPS
12.5000 mg | ORAL_CAPSULE | Freq: Every day | ORAL | Status: DC
  Administered 2021-03-15 – 2021-03-17 (×3): 12.5 mg via ORAL
  Filled 2021-03-15 (×3): qty 1

## 2021-03-15 MED ORDER — DIVALPROEX SODIUM ER 500 MG PO TB24
500.0000 mg | ORAL_TABLET | Freq: Two times a day (BID) | ORAL | Status: DC
  Administered 2021-03-15 – 2021-03-17 (×5): 500 mg via ORAL
  Filled 2021-03-15 (×5): qty 1

## 2021-03-15 MED ORDER — LISINOPRIL 20 MG PO TABS
20.0000 mg | ORAL_TABLET | Freq: Every day | ORAL | Status: DC
  Administered 2021-03-15 – 2021-03-17 (×3): 20 mg via ORAL
  Filled 2021-03-15 (×3): qty 1

## 2021-03-15 MED ORDER — BENZTROPINE MESYLATE 1 MG PO TABS
0.5000 mg | ORAL_TABLET | Freq: Every day | ORAL | Status: DC
  Administered 2021-03-15 – 2021-03-16 (×2): 0.5 mg via ORAL
  Filled 2021-03-15 (×3): qty 1

## 2021-03-15 MED ORDER — OLANZAPINE 5 MG PO TABS
5.0000 mg | ORAL_TABLET | Freq: Once | ORAL | Status: AC
  Administered 2021-03-15: 5 mg via ORAL
  Filled 2021-03-15: qty 1

## 2021-03-15 MED ORDER — PALIPERIDONE ER 6 MG PO TB24
6.0000 mg | ORAL_TABLET | Freq: Every day | ORAL | Status: DC
  Administered 2021-03-15 – 2021-03-17 (×3): 6 mg via ORAL
  Filled 2021-03-15 (×3): qty 1

## 2021-03-15 MED ORDER — LISINOPRIL-HYDROCHLOROTHIAZIDE 20-12.5 MG PO TABS: 1.0000 | ORAL_TABLET | Freq: Every day | ORAL | Status: DC

## 2021-03-15 NOTE — ED Notes (Signed)
Pt up. Ate dinner tray and is walking around room. Sitter remains.
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
## 2021-03-15 NOTE — ED Notes (Signed)
Pt continues to sleep. NAD noted. Sitter remains. Meal tray at bedside. 

## 2021-03-15 NOTE — ED Notes (Signed)
Pt laying in bed. NAD noted. Sitter remains.

## 2021-03-15 NOTE — ED Provider Notes (Signed)
Emergency Medicine Observation Re-evaluation Note  Alison Breeding is a 40 y.o. male, seen on rounds today.  Pt initially presented to the ED for complaints of agitated behavior, ivc. Currently resting.   Physical Exam  BP 139/86   Pulse 72   Temp 98.5 F (36.9 C)   Resp 16   SpO2 99%  Physical Exam General: resting  Cardiac: regular rate Lungs: breathing comfortably Psych: calm, resting.   ED Course / MDM  EKG:EKG Interpretation  Date/Time:  Monday March 14 2021 18:40:31 EDT Ventricular Rate:  66 PR Interval:  148 QRS Duration: 104 QT Interval:  368 QTC Calculation: 385 R Axis:   65 Text Interpretation: Normal sinus rhythm Minimal voltage criteria for LVH, may be normal variant ( Sokolow-Lyon ) Borderline ECG Confirmed by Lockie Mola, Adam ([id number]) on 03/14/2021 6:51:32 PM  I have reviewed the labs performed to date as well as medications administered while in observation.  Recent changes in the last 24 hours include transfer to ED from [facility].   Plan  Current plan is for [facility] reassessment - it currently appears pt was not restarted on his home meds. Will restart meds, but as his BH meds go - have re-consulted BH team for reassessment this AM to including BH medication recommendation and disposition.         Cathren Laine, MD 03/15/21 [phone]

## 2021-03-15 NOTE — ED Notes (Signed)
Breakfast Order Placed ?

## 2021-03-15 NOTE — ED Notes (Signed)
Pt remains asleep. NAD noted. Sitter remains. 
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
## 2021-03-15 NOTE — ED Notes (Signed)
Pt agreeable to HS medication. Pt given graham crackers and cheese by sitter. Denies further needs. NAD noted.

## 2021-03-15 NOTE — Progress Notes (Signed)
Patient has been faxed out due to no bed available at [facility]. Patient meets inpatient criteria per Ene Ajibola,NP. Patient referred to the following facilities:  [facility]  [address] [place] [address] [phone]  [facility]  [address], [place] [address] [phone]  [facility]  [address] [place] [address] [phone]  [facility]  [address] [place] [address] [phone]  [facility]  [address] [place], [place] [address] [phone]  [facility]  [address] [place], [place] [address] [phone]  [facility]  [address] [place] [address] [phone]  [facility]  [address], [place] [address] [phone]  [facility]  [address] [place] [address] [phone]    CSW will continue to monitor disposition.     [name], MSW, LCSW-A  9:52 AM 03/15/2021

## 2021-03-15 NOTE — ED Notes (Signed)
Pt appears to be asleep. NAD noted. Sitters switched out.

## 2021-03-15 NOTE — ED Notes (Signed)
Pt sleeping at this time. Will obtain vitals when pt wakes up.  

## 2021-03-15 NOTE — ED Notes (Signed)
Pt appears to be asleep. NAD noted. Sitter present. 

## 2021-03-16 NOTE — ED Notes (Signed)
Pt continues to sleep. NAD noted. Sitter remains. 

## 2021-03-16 NOTE — ED Notes (Signed)
The patient came to the nurses station and asked if he could make a call to his mother. He stated that he did not understand why he could not get through to her. His sitter assisted the patient in the placing the call but the call was not successful

## 2021-03-16 NOTE — Progress Notes (Signed)
Patient has been faxed out due to no bed availability at [facility]. Patient meets inpatient criteria per [name] ,NP. Patient referred to the following facilities:  [facility]  [address], [place] [address] [phone]  [facility]  [address], [place] [address] [phone]  [facility]  [address] [place] [address] [phone]  [facility]  [address] [place] [address] [phone]  [facility]  [address] [place], [place] [address] [phone]  [facility]  [address] [place], [place] [address] [phone]  [facility]  [address] [place] [address] [phone]  [facility]  [address] [place] [address] [phone]  [facility]  [address] [place] [address] [phone]  [facility]  [address] [place] [address] [phone]  [facility]  [address] [place], [place] [address] [phone]  [facility]  [phone] [place] [address] [phone]  [facility]  [address] [place] [address] [phone]  [facility]  [address] [place] [address] [phone]  [facility]  [address] [place] [address] [phone]    CSW will continue to monitor disposition.     [name], MSW, LCSW-A  10:19 AM 03/16/2021

## 2021-03-16 NOTE — ED Notes (Signed)
Belongings found in triage, inventoried and placed in locker 14

## 2021-03-16 NOTE — ED Notes (Signed)
Pt amiable, polite, alert, NAD, calm, interactive, resps e/u, speaking in clear complete sentences, steady gait, recent VSS. Pt attempted to call mother on desk phone, unable to get thru (similar experience previously).Sitter present.

## 2021-03-16 NOTE — ED Notes (Signed)
Patient was given Cheese, Steven Clayton, Peanut Butter and Cup of Ginger Ale for Snack.

## 2021-03-17 ENCOUNTER — Inpatient Hospital Stay (HOSPITAL_COMMUNITY)
Admission: AD | Admit: 2021-03-17 | Discharge: 2021-04-01 | DRG: 885 | Disposition: A | Discharge status: Home / Self Care | Payer: Medicare Other | Source: Intra-hospital | Attending: Psychiatry | Admitting: Psychiatry

## 2021-03-17 DIAGNOSIS — Z83438 Family history of other disorder of lipoprotein metabolism and other lipidemia: Secondary | ICD-10-CM | POA: Diagnosis not present

## 2021-03-17 DIAGNOSIS — F250 Schizoaffective disorder, bipolar type: Principal | ICD-10-CM | POA: Diagnosis present

## 2021-03-17 DIAGNOSIS — Z9151 Personal history of suicidal behavior: Secondary | ICD-10-CM | POA: Diagnosis not present

## 2021-03-17 DIAGNOSIS — Z20822 Contact with and (suspected) exposure to covid-19: Secondary | ICD-10-CM | POA: Diagnosis not present

## 2021-03-17 DIAGNOSIS — Z9114 Patient's other noncompliance with medication regimen: Secondary | ICD-10-CM | POA: Diagnosis not present

## 2021-03-17 DIAGNOSIS — I10 Essential (primary) hypertension: Secondary | ICD-10-CM

## 2021-03-17 DIAGNOSIS — Z8673 Personal history of transient ischemic attack (TIA), and cerebral infarction without residual deficits: Secondary | ICD-10-CM

## 2021-03-17 DIAGNOSIS — F17200 Nicotine dependence, unspecified, uncomplicated: Secondary | ICD-10-CM | POA: Diagnosis present

## 2021-03-17 DIAGNOSIS — K219 Gastro-esophageal reflux disease without esophagitis: Secondary | ICD-10-CM | POA: Diagnosis present

## 2021-03-17 DIAGNOSIS — F419 Anxiety disorder, unspecified: Secondary | ICD-10-CM | POA: Diagnosis present

## 2021-03-17 DIAGNOSIS — Z79899 Other long term (current) drug therapy: Secondary | ICD-10-CM | POA: Diagnosis not present

## 2021-03-17 DIAGNOSIS — Z818 Family history of other mental and behavioral disorders: Secondary | ICD-10-CM

## 2021-03-17 DIAGNOSIS — Z8249 Family history of ischemic heart disease and other diseases of the circulatory system: Secondary | ICD-10-CM

## 2021-03-17 DIAGNOSIS — F17210 Nicotine dependence, cigarettes, uncomplicated: Secondary | ICD-10-CM | POA: Diagnosis not present

## 2021-03-17 LAB — RESP PANEL BY RT-PCR (FLU A&B, COVID) ARPGX2
Influenza A by PCR: NEGATIVE
Influenza B by PCR: NEGATIVE
SARS Coronavirus 2 by RT PCR: NEGATIVE

## 2021-03-17 LAB — LIPID PANEL
Cholesterol: 182 mg/dL (ref 0–200)
HDL: 56 mg/dL (ref >40)
LDL Cholesterol: 102 mg/dL — ABNORMAL HIGH (ref 0–99)
Total CHOL/HDL Ratio: 3.3 RATIO
Triglycerides: 118 mg/dL (ref <150)
VLDL: 24 mg/dL (ref 0–40)

## 2021-03-17 LAB — TSH: TSH: 1.898 u[IU]/mL (ref 0.350–4.500)

## 2021-03-17 MED ORDER — HYDROCHLOROTHIAZIDE 12.5 MG PO CAPS
12.5000 mg | ORAL_CAPSULE | Freq: Every day | ORAL | Status: DC
  Administered 2021-03-18 – 2021-04-01 (×15): 12.5 mg via ORAL
  Filled 2021-03-17 (×19): qty 1

## 2021-03-17 MED ORDER — DIVALPROEX SODIUM ER 500 MG PO TB24
500.0000 mg | ORAL_TABLET | Freq: Two times a day (BID) | ORAL | Status: DC
  Administered 2021-03-17 – 2021-03-18 (×2): 500 mg via ORAL
  Filled 2021-03-17 (×7): qty 1

## 2021-03-17 MED ORDER — ACETAMINOPHEN 325 MG PO TABS: 650.0000 mg | ORAL_TABLET | Freq: Four times a day (QID) | ORAL | Status: DC | PRN

## 2021-03-17 MED ORDER — BENZTROPINE MESYLATE 0.5 MG PO TABS
0.5000 mg | ORAL_TABLET | Freq: Every day | ORAL | Status: DC
  Administered 2021-03-17 – 2021-03-26 (×10): 0.5 mg via ORAL
  Filled 2021-03-17 (×12): qty 1

## 2021-03-17 MED ORDER — LORAZEPAM 1 MG PO TABS: 1.0000 mg | ORAL_TABLET | Freq: Four times a day (QID) | ORAL | Status: AC | PRN

## 2021-03-17 MED ORDER — RISPERIDONE 2 MG PO TBDP: 2.0000 mg | ORAL_TABLET | Freq: Three times a day (TID) | ORAL | Status: DC | PRN

## 2021-03-17 MED ORDER — MAGNESIUM HYDROXIDE 400 MG/5ML PO SUSP: 30.0000 mL | Freq: Every day | ORAL | Status: DC | PRN

## 2021-03-17 MED ORDER — ALUM & MAG HYDROXIDE-SIMETH 200-200-20 MG/5ML PO SUSP: 30.0000 mL | ORAL | Status: DC | PRN

## 2021-03-17 MED ORDER — ZIPRASIDONE MESYLATE 20 MG IM SOLR: 20.0000 mg | Freq: Two times a day (BID) | INTRAMUSCULAR | Status: AC | PRN

## 2021-03-17 MED ORDER — PALIPERIDONE ER 6 MG PO TB24
6.0000 mg | ORAL_TABLET | Freq: Every day | ORAL | Status: DC
  Administered 2021-03-18: 6 mg via ORAL
  Filled 2021-03-17 (×3): qty 1

## 2021-03-17 MED ORDER — LISINOPRIL 20 MG PO TABS
20.0000 mg | ORAL_TABLET | Freq: Every day | ORAL | Status: DC
  Administered 2021-03-18 – 2021-04-01 (×15): 20 mg via ORAL
  Filled 2021-03-17 (×18): qty 1

## 2021-03-17 MED ORDER — PALIPERIDONE PALMITATE ER 234 MG/1.5ML IM SUSY: 234.0000 mg | PREFILLED_SYRINGE | Freq: Once | INTRAMUSCULAR | Status: DC
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
## 2021-03-17 NOTE — Progress Notes (Signed)
Pt accepted to [facility] 500-1    Patient meets inpatient criteria per [name], NP.    Dr.Greg Jola Babinski is the attending provider.    Call report to [phone]    Ella Bodo, RN @ [facility] ED notified.     Pt scheduled  to arrive at [facility] today by 1430   [name], MSW, LCSW-A  1:46 PM 03/17/2021
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
## 2021-03-17 NOTE — Tx Team (Signed)
Initial Treatment Plan 03/17/2021 8:03 PM Steven Clayton QMV:[id number]    PATIENT STRESSORS: Marital or family conflict   PATIENT STRENGTHS: Active sense of humor   PATIENT IDENTIFIED PROBLEMS: Family conflict                     DISCHARGE CRITERIA:  Improved stabilization in mood, thinking, and/or behavior Motivation to continue treatment in a less acute level of care  PRELIMINARY DISCHARGE PLAN: Outpatient therapy Participate in family therapy Return to previous living arrangement  PATIENT/FAMILY INVOLVEMENT: This treatment plan has been presented to and reviewed with the patient, Steven Clayton, and/or family member.  The patient and family have been given the opportunity to ask questions and make suggestions.  Elpidio Anis, RN 03/17/2021, 8:03 PM

## 2021-03-17 NOTE — ED Notes (Addendum)
EDP rounding, into see pt. Pt sleeping/ resting, interactive, NAD, calm.

## 2021-03-17 NOTE — Progress Notes (Signed)
   03/17/21 2030  Psych Admission Type (Psych Patients Only)  Admission Status Involuntary  Psychosocial Assessment  Patient Complaints Anxiety  Eye Contact Brief  Facial Expression Animated  Affect Euphoric;Preoccupied  Speech Pressured;Tangential  Interaction Forwards little;Assertive  Motor Activity Fidgety;Hyperactive  Appearance/Hygiene In scrubs  Behavior Characteristics Cooperative;Anxious;Fidgety  Mood Euphoric  Thought Chartered certified accountant of ideas;Loose associations  Content WDL  Delusions None reported or observed  Perception WDL  Hallucination None reported or observed  Judgment Impaired  Confusion None  Danger to Self  Current suicidal ideation? Denies  Danger to Others  Danger to Others None reported or observed   Pt seen in dayroom. Pt anxious, animated and fidgety. Says he feels better now that he is here for help. Pt denies SI,HI, AVH and pain. Endorses anxiety 9/10. Takes meds as prescribed.

## 2021-03-17 NOTE — ED Notes (Addendum)
Lunch eaten. Pt up to b/r. Amiable, alert, NAD, calm, interactive, steady gait. Accepted to [facility] 500-1. Attempted report can be called to [phone].

## 2021-03-17 NOTE — ED Notes (Signed)
Patient was given a Snack and A Drink.

## 2021-03-17 NOTE — ED Notes (Signed)
[facility] here for transport to [facility] 500-1, pt leaving. Belongs signed for and given to [facility].

## 2021-03-17 NOTE — Progress Notes (Signed)
Patient information has been sent to [facility] via secure chat to review for potential admission. Patient meets inpatient criteria per [name], NP .   Situation ongoing, CSW will continue to monitor progress.    Signed:  [name], MSW, LCSW-A  03/17/2021 11:29 AM

## 2021-03-18 DIAGNOSIS — I10 Essential (primary) hypertension: Secondary | ICD-10-CM

## 2021-03-18 MED ORDER — PALIPERIDONE ER 6 MG PO TB24
6.0000 mg | ORAL_TABLET | Freq: Every day | ORAL | Status: DC
  Administered 2021-03-19: 6 mg via ORAL
  Filled 2021-03-18 (×3): qty 1

## 2021-03-18 MED ORDER — PALIPERIDONE ER 3 MG PO TB24
3.0000 mg | ORAL_TABLET | Freq: Every day | ORAL | Status: DC
  Filled 2021-03-18 (×3): qty 1

## 2021-03-18 MED ORDER — DIVALPROEX SODIUM ER 500 MG PO TB24
750.0000 mg | ORAL_TABLET | Freq: Every day | ORAL | Status: DC
  Administered 2021-03-19: 750 mg via ORAL
  Filled 2021-03-18 (×3): qty 1

## 2021-03-18 NOTE — Progress Notes (Signed)
Pt slept for 5.50 hrs last night (received scheduled HS meds). Pt denies SI/HI/AVH. Upon interaction, Pt appears to be animated/hyper/euphoric. Pt states "I slept excellent". Pt remains safe.

## 2021-03-18 NOTE — Progress Notes (Signed)
Pt did not attend psycho-ed group. 

## 2021-03-18 NOTE — Plan of Care (Signed)
  Problem: Education: Goal: Emotional status will improve Outcome: Not Progressing Goal: Mental status will improve Outcome: Not Progressing Goal: Verbalization of understanding the information provided will improve Outcome: Not Progressing   

## 2021-03-18 NOTE — [facility] Group Notes (Signed)
[facility] LCSW Group Therapy  03/18/2021 1:17 PM  Type of Therapy:  Group Therapy: Impulse Control  Participation Level:  Active  Participation Quality:  Appropriate  Affect:  Appropriate  Cognitive:  Appropriate  Insight:  Engaged  Engagement in Therapy:  Engaged  Modes of Intervention:  Discussion and Education  Summary of Progress/Problems: Patient participated appropriately in group. Patient discussed different ways that he has acted impulsively and used marijuana to deal with stressors. Patient discussed ways to delay instant gratification. Patient was able to interact and participate well with peers. Patient offered good examples and insight to peers to delay gratification.   Faylinn Schwenn E Debarah Mccumbers 03/18/2021, 1:17 PM

## 2021-03-18 NOTE — Progress Notes (Signed)
Pt has not taken meds in months  Last filled at [facility] 09/30/20 Depakote ER 1000 hs Benztropine 0.5 mg BID  Lisinopril 5 daily  Last Eyvonne Mechanic from [place] was 410 mg filled on 12/26/19  Peggye Fothergill, Pharm D

## 2021-03-18 NOTE — [facility] Group Notes (Signed)
Adult Psychoeducational Group Note  Date:  03/18/2021 Time:  9:05 PM  Group Topic/Focus:  Coping With Mental Health Crisis:   The purpose of this group is to help patients identify strategies for coping with mental health crisis.  Group discusses possible causes of crisis and ways to manage them effectively.  Participation Level:  Active  Participation Quality:  Appropriate and Attentive  Affect:  Appropriate  Cognitive:  Appropriate  Insight: Good  Engagement in Group:  Engaged  Modes of Intervention:  Discussion  Additional Comments  Jacalyn Lefevre 03/18/2021, 9:05 PM

## 2021-03-18 NOTE — Progress Notes (Signed)
Recreation Therapy Notes  Date: 7.29.22 Time: 1000 Location: 500 Hall Dayroom  Group Topic: Communication, Team Building, Problem Solving  Goal Area(s) Addresses:  Patient will effectively work with peer towards shared goal.  Patient will identify skills used to make activity successful.  Patient will share challenges and verbalize solution-driven approaches used. Patient will identify how skills used during activity can be used to reach post d/c goals.   Behavioral Response: Engaged  Intervention: STEM Activity   Activity: [employer]. Patients were provided the following materials: 5 drinking straws, 5 rubber bands, 5 paper clips, 2 index cards and 2 drinking cups. Using the provided materials patients were asked to build a launching mechanism to launch a ping pong ball across the room, approximately 10 feet. Patients were divided into teams of 3-5. Instructions required all materials be incorporated into the device, functionality of items left to the peer group's discretion.  Education: Pharmacist, community, Scientist, physiological, Air cabin crew, Building control surveyor.   Education Outcome: Acknowledges education/In group clarification offered/Needs additional education.   Clinical Observations/Feedback: Pt and group had a hard time understanding the instructions even with multiple explanations.  Pt worked well with peers and appeared to be getting along with group members.  Pt was appropriate and active with activity.     Caroll Rancher, LRT/CTRS         Caroll Rancher A 03/18/2021 12:46 PM

## 2021-03-18 NOTE — [facility] Group Notes (Signed)
SPIRITUALITY GROUP NOTE   Spirituality group facilitated by Kathleen Argue, BCC.   Group Description: Group focused on topic of hope. Patients participated in facilitated discussion around topic, connecting with one another around experiences and definitions for hope. Group members engaged with visual explorer photos, reflecting on what hope looks like for them today. Group engaged in discussion around how their definitions of hope are present today in hospital.   Modalities: Psycho-social ed, Adlerian, Narrative, MI   Patient Progress: Steven Clayton attended group and engaged in conversation.  His engagement was appropriate and attentive.  Chaplain Dyanne Carrel, Bcc Pager, [phone] 10:57 PM

## 2021-03-18 NOTE — Tx Team (Signed)
Interdisciplinary Treatment and Diagnostic Plan Update  03/18/2021 Time of Session: 9:40am Steven Clayton MRN: [id number]  Principal Diagnosis: <principal problem not specified>  Secondary Diagnoses: Active Problems:   Schizoaffective disorder, bipolar type (HCC)   Current Medications:  Current Facility-Administered Medications  Medication Dose Route Frequency Provider Last Rate Last Admin   acetaminophen (TYLENOL) tablet 650 mg  650 mg Oral Q6H PRN White, Patrice L, NP       alum & mag hydroxide-simeth (MAALOX/MYLANTA) 200-200-20 MG/5ML suspension 30 mL  30 mL Oral Q4H PRN White, Patrice L, NP       benztropine (COGENTIN) tablet 0.5 mg  0.5 mg Oral QHS White, Patrice L, NP   0.5 mg at 03/17/21 2044   [START ON 03/19/2021] divalproex (DEPAKOTE ER) 24 hr tablet 750 mg  750 mg Oral QHS Sharma Covert, MD       lisinopril (ZESTRIL) tablet 20 mg  20 mg Oral Daily White, Patrice L, NP   20 mg at 03/18/21 8325   And   hydrochlorothiazide (MICROZIDE) capsule 12.5 mg  12.5 mg Oral Daily White, Patrice L, NP   12.5 mg at 03/18/21 0816   risperiDONE (RISPERDAL M-TABS) disintegrating tablet 2 mg  2 mg Oral Q8H PRN Sharma Covert, MD       And   LORazepam (ATIVAN) tablet 1 mg  1 mg Oral Q6H PRN Sharma Covert, MD       And   ziprasidone (GEODON) injection 20 mg  20 mg Intramuscular Q12H PRN Sharma Covert, MD       magnesium hydroxide (MILK OF MAGNESIA) suspension 30 mL  30 mL Oral Daily PRN White, Patrice L, NP       [START ON 03/19/2021] paliperidone (INVEGA) 24 hr tablet 6 mg  6 mg Oral QHS Sharma Covert, MD       PTA Medications: No medications prior to admission.    Patient Stressors: Marital or family conflict  Patient Strengths: Active sense of humor  Treatment Modalities: Medication Management, Group therapy, Case management,  1 to 1 session with clinician, Psychoeducation, Recreational therapy.   Physician Treatment Plan for Primary Diagnosis: <principal  problem not specified> Long Term Goal(s):     Short Term Goals:    Medication Management: Evaluate patient's response, side effects, and tolerance of medication regimen.  Therapeutic Interventions: 1 to 1 sessions, Unit Group sessions and Medication administration.  Evaluation of Outcomes: Not Met  Physician Treatment Plan for Secondary Diagnosis: Active Problems:   Schizoaffective disorder, bipolar type ([place])  Long Term Goal(s):     Short Term Goals:       Medication Management: Evaluate patient's response, side effects, and tolerance of medication regimen.  Therapeutic Interventions: 1 to 1 sessions, Unit Group sessions and Medication administration.  Evaluation of Outcomes: Not Met   RN Treatment Plan for Primary Diagnosis: <principal problem not specified> Long Term Goal(s): Knowledge of disease and therapeutic regimen to maintain health will improve  Short Term Goals: Ability to demonstrate self-control, Ability to verbalize feelings will improve, and Ability to identify and develop effective coping behaviors will improve  Medication Management: RN will administer medications as ordered by provider, will assess and evaluate patient's response and provide education to patient for prescribed medication. RN will report any adverse and/or side effects to prescribing provider.  Therapeutic Interventions: 1 on 1 counseling sessions, Psychoeducation, Medication administration, Evaluate responses to treatment, Monitor vital signs and CBGs as ordered, Perform/monitor CIWA, COWS, AIMS and Fall Risk screenings  as ordered, Perform wound care treatments as ordered.  Evaluation of Outcomes: Not Met   LCSW Treatment Plan for Primary Diagnosis: <principal problem not specified> Long Term Goal(s): Safe transition to appropriate next level of care at discharge, Engage patient in therapeutic group addressing interpersonal concerns.  Short Term Goals: Engage patient in aftercare planning  with referrals and resources, Increase ability to appropriately verbalize feelings, and Facilitate acceptance of mental health diagnosis and concerns  Therapeutic Interventions: Assess for all discharge needs, 1 to 1 time with Social worker, Explore available resources and support systems, Assess for adequacy in community support network, Educate family and significant other(s) on suicide prevention, Complete Psychosocial Assessment, Interpersonal group therapy.  Evaluation of Outcomes: Not Met   Progress in Treatment: Attending groups: Yes. Participating in groups: Yes. Taking medication as prescribed: Yes. Toleration medication: Yes. Family/Significant other contact made: No, will contact:  CSW will obtain consent from pt Patient understands diagnosis: No. Discussing patient identified problems/goals with staff: Yes. Medical problems stabilized or resolved: Yes. Denies suicidal/homicidal ideation: Yes. Issues/concerns per patient self-inventory: No. Other: None  New problem(s) identified: No, Describe:  None  New Short Term/Long Term Goal(s):medication stabilization, elimination of SI thoughts, development of comprehensive mental wellness plan.   Patient Goals:  "make sure I get out safe."  Discharge Plan or Barriers: Patient recently admitted. CSW will continue to follow and assess for appropriate referrals and possible discharge planning.   Reason for Continuation of Hospitalization: Aggression Mania Medication stabilization  Estimated Length of Stay: 3-5 days  Attendees: Patient: Steven Clayton 03/18/2021   Physician: Dr. Kai Levins 03/18/2021   Nursing:  03/18/2021   RN Care Manager: 03/18/2021   Social Worker: Toney Reil, [place] 03/18/2021   Recreational Therapist:  03/18/2021   Other:  03/18/2021   Other:  03/18/2021   Other: 03/18/2021     Scribe for Treatment Team: Mliss Fritz, Latanya Presser 03/18/2021 2:53 PM

## 2021-03-18 NOTE — Progress Notes (Signed)
Pt did not attend orientation/goals group. 

## 2021-03-18 NOTE — Progress Notes (Signed)
      03/18/21 2106  Psych Admission Type (Psych Patients Only)  Admission Status Involuntary  Psychosocial Assessment  Patient Complaints Anxiety;Depression  Eye Contact Brief  Facial Expression Animated  Affect Euphoric;Preoccupied  Speech Pressured;Tangential  Interaction Forwards little;Assertive  Motor Activity Fidgety;Hyperactive  Appearance/Hygiene In scrubs  Behavior Characteristics Cooperative  Mood Euphoric;Pleasant  Thought Chartered certified accountant of ideas;Loose associations  Content WDL  Delusions None reported or observed  Perception WDL  Hallucination None reported or observed  Judgment Impaired  Confusion None  Danger to Self  Current suicidal ideation? Denies  Danger to Others  Danger to Others None reported or observed
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
## 2021-03-19 LAB — PROLACTIN: Prolactin: 33.3 ng/mL — ABNORMAL HIGH (ref 4.0–15.2)

## 2021-03-19 LAB — HEMOGLOBIN A1C
Hgb A1c MFr Bld: 5.5 % (ref 4.8–5.6)
Mean Plasma Glucose: 111 mg/dL

## 2021-03-19 NOTE — Plan of Care (Signed)
?  Problem: Health Behavior/Discharge Planning: ?Goal: Compliance with treatment plan for underlying cause of condition will improve ?Outcome: Progressing ?  ?Problem: Education: ?Goal: Emotional status will improve ?Outcome: Not Progressing ?Goal: Mental status will improve ?Outcome: Not Progressing ?  ?

## 2021-03-19 NOTE — [facility] Group Notes (Signed)
  [facility] LCSW Group Therapy Note  Date/Time:  03/19/2021 11:15AM-12:00PM  Type of Therapy and Topic:  Group Therapy:  Feelings About Hospitalization  Participation Level:  Active   Description of Group This process group involved patients discussing their feelings related to being hospitalized, as well as the benefits they see to being in the hospital.  These feelings and benefits were itemized.  The group then brainstormed specific ways in which they could seek those same benefits when they discharge and return home.  Therapeutic Goals Patient will identify and describe positive and negative feelings related to hospitalization Patient will verbalize benefits of hospitalization to themselves personally Patients will brainstorm together ways they can obtain similar benefits in the outpatient setting, identify barriers to wellness and possible solutions  Summary of Patient Progress:  The patient expressed his primary feelings about being hospitalized are "grateful to be alive because I would not have made it through the night if not for the hospital."  He listened throughout group attentively but did not contribute to the discussion.  As soon as group was over, he rushed up to the CSW enthusiastically to shake hands and say "thank you."  Therapeutic Modalities Cognitive Behavioral Therapy Motivational Interviewing    Steven Mantle, LCSW 03/19/2021, 9:31 AM

## 2021-03-19 NOTE — Progress Notes (Signed)
      03/19/21 2137  Psych Admission Type (Psych Patients Only)  Admission Status Involuntary  Psychosocial Assessment  Patient Complaints Depression  Eye Contact Brief  Facial Expression Animated  Affect Euphoric;Preoccupied  Speech Pressured;Tangential  Interaction Forwards little;Assertive  Motor Activity Fidgety;Hyperactive  Appearance/Hygiene Unremarkable  Behavior Characteristics Cooperative  Mood Pleasant;Euthymic  Thought Chartered certified accountant of ideas;Loose associations  Content WDL  Delusions None reported or observed  Perception WDL  Hallucination None reported or observed  Judgment Impaired  Confusion None  Danger to Self  Current suicidal ideation? Denies  Danger to Others  Danger to Others None reported or observed

## 2021-03-19 NOTE — Progress Notes (Signed)
Adult Psychoeducational Group Note  Date:  03/19/2021 Time:  9:22 PM  Group Topic/Focus:  Wrap-Up Group:   The focus of this group is to help patients review their daily goal of treatment and discuss progress on daily workbooks.  Participation Level:  Active  Participation Quality:  Appropriate and Attentive  Affect:  Appropriate  Cognitive:  Alert and Appropriate  Insight: Appropriate  Engagement in Group:  Engaged  Modes of Intervention:  Discussion  Additional Comments: Pt articulated that he did not have a goal for today. Pt stated he did talk with staff about his care. Pt conveyed he made a no phone call and reported relationship with his family and support system was the same. Pt reported he took all medications provided and attended all meal services with 100% intake. Pt said his appetite was good today. Pt evaluated his sleep last night as good. Pt verbalized he felt good about himself and rated his overall day a 7 out of 10 on this date. Pt articulated he had no physical pain. Pt denies no auditory or visual hallucinations or thoughts of harming himself or others. Pt said he would alert staff if anything changed. End of Wrap-Up Group progress report         Nicoletta Dress 03/19/2021, 9:22 PM

## 2021-03-19 NOTE — Progress Notes (Signed)
  D:  Pt presents with moderate anxiety and depression. Pt attended evening group.  Pt denies SI/HI, and verbally contracts for safety.  When pt was asked about AVH, pt responded saying something regarding a, "Ventriloquist", and "Cows," respectively.  Pt approached staff later expressing concern about his housing situation after discharge.  A:  Labs/Vitals monitored; Medication education provided; Pt encouraged to communicate concerns.  R:  Pt remains safe on unit with Q 15 minute safety checks.  Will continue to monitor POC.

## 2021-03-19 NOTE — Progress Notes (Signed)
Pt did not attend orientation/goals group. 
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
## 2021-03-19 NOTE — Progress Notes (Signed)
Pt has stayed to himself for most of the shift.  Pt denies SI/HI/AVH.  Pt taking medications without incident and no adverse reactions were noted.  Pt decided to go out with peers to the gym this afternoon.  RN will continue to monitor and provide assistance as needed.  03/19/21 0800  Psych Admission Type (Psych Patients Only)  Admission Status Other (Comment)  Psychosocial Assessment  Patient Complaints Irritability;Depression;Anxiety  Eye Contact Brief  Facial Expression Animated  Affect Euphoric;Preoccupied  Speech Pressured;Tangential  Interaction Forwards little;Assertive  Motor Activity Fidgety;Hyperactive  Appearance/Hygiene In scrubs  Behavior Characteristics Cooperative  Mood Euthymic;Pleasant  Thought Chartered certified accountant of ideas;Loose associations  Content WDL  Delusions None reported or observed  Perception WDL  Hallucination None reported or observed  Judgment Impaired  Confusion None  Danger to Self  Current suicidal ideation? Denies  Danger to Others  Danger to Others None reported or observed

## 2021-03-20 DIAGNOSIS — F250 Schizoaffective disorder, bipolar type: Principal | ICD-10-CM

## 2021-03-20 LAB — HIV ANTIBODY (ROUTINE TESTING W REFLEX): HIV Screen 4th Generation wRfx: NONREACTIVE

## 2021-03-20 LAB — RPR: RPR Ser Ql: NONREACTIVE

## 2021-03-20 MED ORDER — DIVALPROEX SODIUM ER 500 MG PO TB24
1000.0000 mg | ORAL_TABLET | Freq: Every day | ORAL | Status: DC
  Administered 2021-03-20 – 2021-03-31 (×12): 1000 mg via ORAL
  Filled 2021-03-20 (×15): qty 2

## 2021-03-20 MED ORDER — PALIPERIDONE ER 6 MG PO TB24
9.0000 mg | ORAL_TABLET | Freq: Every day | ORAL | Status: DC
  Administered 2021-03-20 – 2021-03-25 (×6): 9 mg via ORAL
  Filled 2021-03-20 (×9): qty 1

## 2021-03-20 NOTE — [facility] Suicide Risk Assessment (Signed)
[facility] INPATIENT:  Family/Significant Other Suicide Prevention Education  Suicide Prevention Education:  Patient Refusal for Family/Significant Other Suicide Prevention Education: The patient Marqual Mi has refused to provide written consent for family/significant other to be provided Family/Significant Other Suicide Prevention Education during admission and/or prior to discharge.  Physician notified.  Pt declined consent for collateral contact.  Leisa Lenz 03/20/2021, 1:01 PM
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
## 2021-03-20 NOTE — Progress Notes (Signed)
   03/20/21 2058  Psych Admission Type (Psych Patients Only)  Admission Status Involuntary  Psychosocial Assessment  Patient Complaints Depression;Sadness  Eye Contact Brief  Facial Expression Animated  Affect Preoccupied;Euphoric  Speech Pressured;Tangential  Interaction Assertive  Motor Activity Fidgety  Appearance/Hygiene Unremarkable  Behavior Characteristics Cooperative;Appropriate to situation  Mood Pleasant;Euthymic  Thought Chartered certified accountant of ideas;Loose associations  Content WDL  Delusions None reported or observed  Perception WDL  Hallucination None reported or observed  Judgment Impaired  Confusion None  Danger to Self  Current suicidal ideation? Denies  Danger to Others  Danger to Others None reported or observed

## 2021-03-20 NOTE — BHH Group Notes (Signed)
[facility] LCSW Group Therapy Note  Date/Time:  03/20/2021  11:00AM-12:00PM  Type of Therapy and Topic:  Group Therapy:  Music and Mood  Participation Level:  Active   Description of Group: In this process group, members listened to a variety of genres of music and identified that different types of music evoke different responses.  Patients were encouraged to identify music that was soothing for them and music that was energizing for them.  Patients discussed how this knowledge can help with wellness and recovery in various ways including managing depression and anxiety as well as encouraging healthy sleep habits.    Therapeutic Goals: Patients will explore the impact of different varieties of music on mood Patients will verbalize the thoughts they have when listening to different types of music Patients will identify music that is soothing to them as well as music that is energizing to them Patients will discuss how to use this knowledge to assist in maintaining wellness and recovery Patients will explore the use of music as a coping skill  Summary of Patient Progress:  At the beginning of group, patient expressed that he felt ready to go but was willing to stay until the hospital staff feel he is ready to go.  He participated fully and was happy and positive throughout group.  At the end of group, patient expressed that he now is feeling more anxious and jittery, but positive, wanting to go somewhere positive when he leaves the hospital.    Therapeutic Modalities: Solution Focused Brief Therapy Activity   Ambrose Mantle, LCSW
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
## 2021-03-21 ENCOUNTER — Other Ambulatory Visit (HOSPITAL_COMMUNITY): Payer: Self-pay

## 2021-03-21 LAB — GC/CHLAMYDIA PROBE AMP (~~LOC~~) NOT AT ARMC
Chlamydia: NEGATIVE
Comment: NEGATIVE
Comment: NORMAL
Neisseria Gonorrhea: NEGATIVE

## 2021-03-21 MED ORDER — INVEGA SUSTENNA 156 MG/ML IM SUSY
PREFILLED_SYRINGE | INTRAMUSCULAR | 0 refills | Status: DC
  Filled 2021-03-24: qty 1, 30d supply, fill #0

## 2021-03-21 MED ORDER — PALIPERIDONE PALMITATE ER 156 MG/ML IM SUSY
156.0000 mg | PREFILLED_SYRINGE | Freq: Once | INTRAMUSCULAR | Status: AC
  Administered 2021-03-25: 156 mg via INTRAMUSCULAR
  Filled 2021-03-21: qty 1

## 2021-03-21 MED ORDER — PALIPERIDONE PALMITATE ER 234 MG/1.5ML IM SUSY
234.0000 mg | PREFILLED_SYRINGE | INTRAMUSCULAR | 0 refills | Status: DC
  Filled 2021-03-21: qty 1.5, 5d supply, fill #0

## 2021-03-21 MED ORDER — PALIPERIDONE PALMITATE ER 234 MG/1.5ML IM SUSY
234.0000 mg | PREFILLED_SYRINGE | Freq: Once | INTRAMUSCULAR | Status: AC
  Administered 2021-03-21: 234 mg via INTRAMUSCULAR
  Filled 2021-03-21: qty 1.5

## 2021-03-21 NOTE — Progress Notes (Signed)
Recreation Therapy Notes  Date: 8.1.22 Time: 1000 Location: [address] Day Room  Group Topic: Goal Setting  Goal Area(s) Addresses:  Patient will participate in discussion of what a goal is. Patient will successfully complete worksheet breaking down goals into time blocks.  Intervention: Group Conversation, Worksheet  Activity: Group started with a discussion about group rules. Patients had a group conversation on SMART goals, and what the acronym stands for; specific, measurable, attainable, relevant, and time-bound. Patients were given a worksheet that breaks down (week, month, year and 5 years) when patients want to complete goals. Patients then had to explain the barriers that would be encountered, what will be needed to complete goals and what they can begin doing tomorrow to work towards goals.   Education:  Education on [employer]  Education Outcome: Acknowledges education  Clinical Observations/Feedback: Patient did not attend group session.    Caroll Rancher, LRT/CTRS         Caroll Rancher A 03/21/2021 12:18 PM

## 2021-03-21 NOTE — Progress Notes (Signed)
[facility] MD Progress Note  03/21/2021 2:24 PM Steven Clayton  MRN:  [id number] Subjective:   Steven Clayton is a 40 y.o. male, with reported past psychiatric history of schizophrenia versus schizoaffective disorder - bipolar disorder type, cocaine use. Patient presents involuntarily (IVC by mom) to [facility] (03/17/2021), transferred from [facility] for mania and aggression towards [place]. [facility] stay day 4.   Last night:  Patient is compliant with scheduled meds. PRNs: None  Today (03/21/2021): Patient's affect is congruent, and mood seems euphoric. Speech is still pressured and disorganized.  However compared to last encounter, patient will answer questions directly then follow with disorganized comment.  Patient was pleasant on interview.   Patient stated that his mood is "good", his appetite is "swell", that he slept "great".  Patient slept Number of Hours: 7.5.  Patient reported that there was restful, denied nightmares or dreams. Patient stated he is tolerating his medications well, he denied headache, dizziness, blurry vision, abdominal pain, nausea, vomiting, diarrhea, muscle stiffness, involuntary movements. Patient denied SI/HI/AVH, paranoia, and stated that he feels safe here. Discussed with patient the use of a long-acting injectable, patient consented and has no questions about it.  Stated that he is familiar as he has done them in the past.  Principal Problem: Schizoaffective disorder, bipolar type (HCC) Diagnosis: Principal Problem:   Schizoaffective disorder, bipolar type (HCC) Active Problems:   Tobacco use disorder, moderate, dependence   Essential hypertension  Total Time spent with patient: 30 minutes  Past Psychiatric History:  Previous psych diagnosis: Schizophrenia versus schizoaffective disorder - bipolar disorder type Current psych meds: Depakote as well as the every 3 month injection of paliperidone Past suicide attempts: Cut wrists (cut wrists) Past  homicidal behavior: None Past nonsuicidal self-harm: None Psych hospitalizations: [facility] (2003), [facility] (2004), [facility] (2006) x2, [facility] (2016)  [facility] Past psych med trials: Depakote, Risperdal, Abilify  Past Medical History:  Past Medical History:  Diagnosis Date   Anxiety    Depression    Schizoaffective disorder (HCC)    Stroke (HCC)    No past surgical history on file. Family History:  Family History  Problem Relation Age of Onset   Heart disease Mother    Hyperlipidemia Mother    Mental illness Father    Family Psychiatric  History:    Social History:  Social History   Substance and Sexual Activity  Alcohol Use Yes   Alcohol/week: 1.0 standard drink   Types: 1 Glasses of wine per week     Social History   Substance and Sexual Activity  Drug Use No    Social History   Socioeconomic History   Marital status: Single    Spouse name: Not on file   Number of children: Not on file   Years of education: Not on file   Highest education level: Not on file  Occupational History   Not on file  Tobacco Use   Smoking status: Some Days    Packs/day: 2.00    Types: Cigarettes   Smokeless tobacco: Never  Substance and Sexual Activity   Alcohol use: Yes    Alcohol/week: 1.0 standard drink    Types: 1 Glasses of wine per week   Drug use: No   Sexual activity: Never  Other Topics Concern   Not on file  Social History Narrative   Not on file   Social Determinants of Health   Financial Resource Strain: Not  on file  Food Insecurity: Not on file  Transportation Needs: Not on file  Physical Activity: Not on file  Stress: Not on file  Social Connections: Not on file   Additional Social History:  Currently patient lives at section 8 housing, alone.  Stated that he is on disabilities.   Sleep: Good  Appetite:  Good  Current Medications: Current Facility-Administered Medications  Medication Dose  Route Frequency Provider Last Rate Last Admin   acetaminophen (TYLENOL) tablet 650 mg  650 mg Oral Q6H PRN White, Patrice L, NP       alum & mag hydroxide-simeth (MAALOX/MYLANTA) 200-200-20 MG/5ML suspension 30 mL  30 mL Oral Q4H PRN White, Patrice L, NP       benztropine (COGENTIN) tablet 0.5 mg  0.5 mg Oral QHS White, Patrice L, NP   0.5 mg at 03/20/21 2058   divalproex (DEPAKOTE ER) 24 hr tablet 1,000 mg  1,000 mg Oral QHS Nwoko, Agnes I, NP   1,000 mg at 03/20/21 2058   lisinopril (ZESTRIL) tablet 20 mg  20 mg Oral Daily White, Patrice L, NP   20 mg at 03/21/21 9147   And   hydrochlorothiazide (MICROZIDE) capsule 12.5 mg  12.5 mg Oral Daily White, Patrice L, NP   12.5 mg at 03/21/21 8295   risperiDONE (RISPERDAL M-TABS) disintegrating tablet 2 mg  2 mg Oral Q8H PRN Antonieta Pert, MD       And   LORazepam (ATIVAN) tablet 1 mg  1 mg Oral Q6H PRN Antonieta Pert, MD       And   ziprasidone (GEODON) injection 20 mg  20 mg Intramuscular Q12H PRN Antonieta Pert, MD       magnesium hydroxide (MILK OF MAGNESIA) suspension 30 mL  30 mL Oral Daily PRN White, Patrice L, NP       [START ON 03/25/2021] paliperidone (INVEGA SUSTENNA) injection 156 mg  156 mg Intramuscular Once Antonieta Pert, MD       paliperidone (INVEGA SUSTENNA) injection 234 mg  234 mg Intramuscular Once Antonieta Pert, MD       paliperidone (INVEGA) 24 hr tablet 9 mg  9 mg Oral QHS Armandina Stammer I, NP   9 mg at 03/20/21 2058    Lab Results:  Results for orders placed or performed during the hospital encounter of 03/17/21 (from the past 48 hour(s))  HIV Antibody (routine testing w rflx)     Status: None   Collection Time: 03/19/21  6:40 PM  Result Value Ref Range   HIV Screen 4th Generation wRfx Non Reactive Non Reactive    Comment: Performed at [facility], [address] [place], [place] [address]  RPR     Status: None   Collection Time: 03/19/21  6:40 PM  Result Value Ref Range   RPR Ser Ql NON  REACTIVE NON REACTIVE    Comment: Performed at [facility], [address], [place], [place] [address]    Blood Alcohol level:  Lab Results  Component Value Date   [facility] <10 03/14/2021   ETH <5 01/18/2015    Metabolic Disorder Labs: Lab Results  Component Value Date   HGBA1C 5.5 03/17/2021   MPG 111 03/17/2021   MPG 114 01/20/2015   Lab Results  Component Value Date   PROLACTIN 33.3 (H) 03/17/2021   PROLACTIN 26.5 (H) 01/20/2015   Lab Results  Component Value Date   CHOL 182 03/17/2021   TRIG 118 03/17/2021  HDL 56 03/17/2021   CHOLHDL 3.3 03/17/2021   VLDL 24 03/17/2021   LDLCALC 102 (H) 03/17/2021   LDLCALC 115 (H) 12/24/2017    Physical Findings: AIMS: Facial and Oral Movements Muscles of Facial Expression: None, normal Lips and Perioral Area: None, normal Jaw: None, normal Tongue: None, normal,Extremity Movements Upper (arms, wrists, hands, fingers): None, normal Lower (legs, knees, ankles, toes): None, normal, Trunk Movements Neck, shoulders, hips: None, normal, Overall Severity Severity of abnormal movements (highest score from questions above): None, normal Incapacitation due to abnormal movements: None, normal Patient's awareness of abnormal movements (rate only patient's report): No Awareness, Dental Status Current problems with teeth and/or dentures?: No Does patient usually wear dentures?: No  CIWA:    COWS:     Musculoskeletal: Strength & Muscle Tone: within normal limits Gait & Station: normal Patient leans: N/A  Psychiatric Specialty Exam:  Presentation  General Appearance: Appropriate for Environment; Casual  Eye Contact:Good  Speech:Clear and Coherent  Speech Volume:Normal  Handedness:Right   Mood and Affect  Mood:Euphoric  Affect:Congruent   Thought Process  Thought Processes:Disorganized; Goal Directed (Patient is able to answer questions directly, however right after patient's will exhibit flight of  thought)  Descriptions of Associations:Circumstantial (Patient is able to answer initial question, however we will add additional disorganized comment.)  Orientation:Full (Time, Place and Person)  Thought Content:Tangential (Patient denied SI/HI/AVH, paranoia)  History of Schizophrenia/Schizoaffective disorder:Yes  Duration of Psychotic Symptoms:Greater than six months  Hallucinations:Hallucinations: None  Ideas of Reference:None  Suicidal Thoughts:Suicidal Thoughts: No  Homicidal Thoughts:Homicidal Thoughts: No   Sensorium  Memory:Immediate Good; Recent Fair; Remote Fair  Judgment:Fair  Insight:Poor   Executive Functions  Concentration:Fair  Attention Span:Fair  Recall:Poor  Fund of Knowledge:Fair  Language:Fair   Psychomotor Activity  Psychomotor Activity:Psychomotor Activity: Normal   Assets  Assets:Communication Skills; Desire for Improvement; Housing   Sleep  Sleep:Sleep: Good Number of Hours of Sleep: 7.5    Physical Exam: Physical Exam Vitals and nursing note reviewed.  HENT:     Head: Normocephalic and atraumatic.  Pulmonary:     Effort: Pulmonary effort is normal.  Neurological:     General: No focal deficit present.     Mental Status: He is alert.   Review of Systems  HENT:  Negative for sore throat.   Eyes:  Negative for blurred vision.  Respiratory:  Negative for shortness of breath.   Cardiovascular:  Negative for chest pain.  Gastrointestinal:  Negative for abdominal pain, constipation, diarrhea, nausea and vomiting.  Musculoskeletal:  Negative for myalgias.  Neurological:  Negative for dizziness and headaches.  Blood pressure 94/72, pulse 85, temperature 98.1 F (36.7 C), temperature source Oral, resp. rate 18, height 5' 8.11" (1.73 m), weight 81.6 kg, SpO2 100 %. Body mass index is 27.28 kg/m.   Treatment Plan Summary: Daily contact with patient to assess and evaluate symptoms and progress in treatment and Medication  management  Steven Noneshomas Fishbaugh is a 40 y.o. male, with reported past psychiatric history of schizophrenia versus schizoaffective disorder - bipolar disorder type, cocaine use. Patient presents involuntarily (IVC by mom) [facility] and [facility] [place]   PSYCHIATRIC DIAGNOSIS & TREATMENT:  Schizoaffective disorder - bipolar type -Continue paliperidone 9 mg p.o. nightly -Start Hinda GlatterInvega Sustenna 234 mg IM today (03/21/2021) Hinda GlatterInvega Sustenna 156 mg IM 4 days later -Continue Depakote ER 1000 mg p.o. nightly (7/31) -Continue Cogentin 0.5 mg p.o. nightly Antipsychotic labs:  EKG: QTc 385 (7/25)             TSH 1.898 (7/28)             Lipid panel: LDL 102, otherwise within normal limits (7/28)             VPA level < 10 (7/26)             UA straw-colored (7/26)             UDS positive for cocaine and THC (7/26)             BAL <10 (7/25)             CBC within normal limits (7/25)             CMP: Cr 1.31, otherwise within normal limits (7/25)             PRL in process (7/28)             A1c in process (7/28)    MEDICAL MANAGEMENT: Primary hypertension -Lisinopril 20 mg p.o. daily -HCTZ 12.5 mg p.o. daily   PRN's -Trazadone 50mg  PO qHS PRN for insomnia -Hydroxyzine 25mg  PO TID PRN for anxiety -Alum & mag hydroxide-simeth 35ml PO qHS PRN for GERD -Magnesium hydroxide 78ml PO daily PRN for constipation -Acetaminophen tablet 650mg  PO PRN q6hrs for mild pain   Discharge Planning: -Social work and case management to assist with discharge planning and identification of hospital follow-up needs prior to discharge -Estimated LOS: 3-4 days -Discharge Concerns: Need to establish a safety plan; Medication compliance and effectiveness -Discharge Goals: Return home with outpatient referrals for mental health follow-up including medication management/psychotherapy  Safety and  Monitoring: -Voluntary admission to inpatient psychiatric unit for safety, stabilization and treatment -Daily contact with patient to assess and evaluate symptoms and progress in treatment -Patient's case to be discussed in multi-disciplinary team meeting -Observation Level : q15 minute checks -Vital signs: q12 hours -Precautions: suicide, elopement, and assault   31m, DO, PGY-1 03/21/2021, 2:24 PM

## 2021-03-21 NOTE — Plan of Care (Signed)
  Problem: Education: Goal: Knowledge of [facility] Education information/materials will improve Outcome: Progressing   Problem: Activity: Goal: Interest or engagement in activities will improve Outcome: Progressing Goal: Sleeping patterns will improve Outcome: Progressing   

## 2021-03-21 NOTE — Progress Notes (Signed)
Recreation Therapy Notes  INPATIENT RECREATION THERAPY ASSESSMENT  Patient Details Name: Steven Clayton MRN: [id number] DOB: [birth year] Today's Date: 03/21/2021       Information Obtained From: Patient  Able to Participate in Assessment/Interview: Yes  Patient Presentation: Alert  Reason for Admission (Per Patient): Med Non-Compliance  Patient Stressors: Other (Comment) (Pt stated "not knowing if I would be able to make appointments to get medications".)  Coping Skills:   Sports, TV, Arguments, Aggression, Music, Exercise, Meditate, Deep Breathing, Substance Abuse, Talk, Prayer, Avoidance, Read, Hot Bath/Shower  Leisure Interests (2+):  Music - Listen, Individual - Other (Comment) (Preoccupy time)  Frequency of Recreation/Participation: Other (Comment) (Daily)  Awareness of Community Resources:  Yes  Community Resources:  Park, Public affairs consultant  Current Use: Yes  If no, Barriers?:    Expressed Interest in [employer] Information: No  [place]idence[place]  Patient Main Form of Transportation: Therapist, music  Patient Strengths:  Reading, Writing  Patient Identified Areas of Improvement:  None  Patient Goal for Hospitalization:  "to get better and make it out of here"  Current SI (including self-harm):  No  Current HI:  No  Current AVH: No  Staff Intervention Plan: Group Attendance, Collaborate with Interdisciplinary Treatment Team  Consent to Intern Participation: N/A    Caroll Rancher, LRT/CTRS   Caroll Rancher A 03/21/2021, 12:45 PM

## 2021-03-21 NOTE — [facility] Group Notes (Signed)
[facility] LCSW Group Therapy 03/21/2021 1:15 PM  Type of Therapy: Group Therapy: Boundaries  Description of Group: This group will address the use of boundaries in their personal lives. Patients will explore why boundaries are important, the difference between healthy and unhealthy boundaries, and negative and postive outcomes of different boundaries and will look at how boundaries can be crossed.  Patients will be encouraged to identify current boundaries in their own lives and identify what kind of boundary is being set. Facilitators will guide patients in utilizing problem-solving interventions to address and correct types boundaries being used and to address when no boundary is being used. Understanding and applying boundaries will be explored and addressed for obtaining and maintaining a balanced life. Patients will be encouraged to explore ways to assertively make their boundaries and needs known to significant others in their lives, using other group members and facilitator for role play, support, and feedback.   Therapeutic Goals: 1. Patient will identify areas in their life where setting clear boundaries could be used to improve their life.  2. Patient will identify signs/triggers that a boundary is not being respected. 3. Patient will identify two ways to set boundaries in order to achieve balance in their lives: 4. Patient will demonstrate ability to communicate their needs and set boundaries through discussion and/or role plays  Participation Level: Minimal  Participation Quality: Appropriate  Affect: Appropriate  Cognitive: Alert and Oriented  Insight: Good  Engagement in Therapy: Supportive  Modes of Intervention: Exploration, Limit-setting, and Socialization  Summary of Progress/Problems: Pt attended and participated in group. Pt shared that healthy boundaries can help with knowing which bridges to burn.   Ruthann Cancer MSW, LCSW Clincal Social Worker  [facility]

## 2021-03-21 NOTE — Progress Notes (Signed)
Progress note  Pt has been seen interacting with others. Pt can be attention seeking but is pleasant. Pt denies si/hi/ah/vh and verbally agrees to approach staff if these become apparent or before harming themselves/others while at [facility].  A: Pt provided support and encouragement. Pt given medication per protocol and standing orders. Q75m safety checks implemented and continued.  R: Pt safe on the unit. Will continue to monitor.    03/21/21 0810  Psych Admission Type (Psych Patients Only)  Admission Status Involuntary  Psychosocial Assessment  Patient Complaints Anxiety  Eye Contact Fair  Facial Expression Animated;Anxious  Affect Anxious;Appropriate to circumstance  Speech Logical/coherent  Interaction Assertive  Motor Activity Slow  Appearance/Hygiene Unremarkable  Behavior Characteristics Cooperative;Appropriate to situation;Anxious  Mood Anxious;Pleasant  Thought Process  Coherency Concrete thinking  Content WDL  Delusions None reported or observed  Perception WDL  Hallucination None reported or observed  Judgment Poor  Confusion None  Danger to Self  Current suicidal ideation? Denies  Danger to Others  Danger to Others None reported or observed

## 2021-03-22 ENCOUNTER — Other Ambulatory Visit (HOSPITAL_COMMUNITY): Payer: Self-pay

## 2021-03-22 DIAGNOSIS — F250 Schizoaffective disorder, bipolar type: Secondary | ICD-10-CM | POA: Diagnosis not present

## 2021-03-22 NOTE — Progress Notes (Signed)
Progress note  Pt found in bed; compliant with medication administration. Pt continues to down play their illness and their symptoms. Pt can seem to laugh inappropriately at times with conversation. Pt is pleasant. Pt has been seen in the milieu interacting with peers. Pt denies si/hi/ah/vh and verbally agrees to approach staff if these become apparent or before harming themselves/others while at [facility].  A: Pt provided support and encouragement. Pt given medication per protocol and standing orders. Q39m safety checks implemented and continued.  R: Pt safe on the unit. Will continue to monitor.

## 2021-03-22 NOTE — Progress Notes (Signed)
Recreation Therapy Notes  Date: 8.2.22 Time: 1000 Location: 500 Hall Dayroom  Group Topic: Wellness  Goal Area(s) Addresses:  Patient will define components of whole wellness. Patient will verbalize benefit of whole wellness.  Behavioral Response: Active  Intervention: Music  Activity: Exercise.  LRT led group in a series of stretches to loosen up the muscles.  Patients would then take turns leading the group in exercises of their choosing.  LRT explained the group was going for 30 good minutes of exercise, moving around to get the heart pumping. Patients were encouraged to take breaks or get water if needed.  Education: Wellness, Building control surveyor.   Education Outcome: Acknowledges education/In group clarification offered/Needs additional education.   Clinical Observations/Feedback: Pt was very engaged in the activity.  Pt would sometimes do his own thing but continued to move to the music.  Pt lead group in some dance moves as well as some stretches.  Pt was bright and laughing at points during group but was appropriate.     Caroll Rancher, LRT/CTRS        Caroll Rancher A 03/22/2021 11:32 AM

## 2021-03-22 NOTE — Progress Notes (Signed)
Adult Psychoeducational Group Note  Date:  03/22/2021 Time:  9:53 PM  Group Topic/Focus:  Wrap-Up Group:   The focus of this group is to help patients review their daily goal of treatment and discuss progress on daily workbooks.  Participation Level:  Active  Participation Quality:  Appropriate  Affect:  Appropriate  Cognitive:  Oriented  Insight: Appropriate  Engagement in Group:  Engaged  Modes of Intervention:  Education  Additional Comments:  Patient attended and participated in group tonight. He reports that the significant that happen to him today was that he got on his medication.  Lita Mains [facility] 03/22/2021, 9:53 PM

## 2021-03-22 NOTE — Plan of Care (Signed)
  Problem: Coping: Goal: Ability to verbalize frustrations and anger appropriately will improve Outcome: Progressing Goal: Ability to demonstrate self-control will improve Outcome: Progressing   Problem: Health Behavior/Discharge Planning: Goal: Identification of resources available to assist in meeting health care needs will improve Outcome: Progressing   

## 2021-03-22 NOTE — Progress Notes (Signed)
Pt did not attend orientation/goals group. 

## 2021-03-22 NOTE — Progress Notes (Addendum)
[facility] MD Progress Note  03/22/2021 2:27 PM Steven Clayton  MRN:  [id number] Subjective:   Steven Clayton is a 40 y.o. male, with reported past psychiatric history of schizophrenia versus schizoaffective disorder - bipolar disorder type, and cocaine use. Patient presents involuntarily (IVC by mom) to [facility] (03/17/2021), transferred from [facility] for mania and aggression towards [place]. [facility] stay day 5.   Last night:  Patient is compliant with scheduled meds. PRNs: None  Today (03/22/2021): Patient's affect is congruent, and mood euthymic. Speech is nonpressured, but still disorganized.  However compared to last encounter, patient will answer questions directly then follow with disorganized comment.  Patient was pleasant on interview.   Patient stated that his mood is "great", his appetite is "good", that he slept "good".  Patient slept Number of Hours: 7.25.  Patient reported that there was restful, denied nightmares or dreams. Patient stated he is tolerating his medications well, he denied headache, dizziness, blurry vision, abdominal pain, nausea, vomiting, diarrhea, muscle stiffness, involuntary movements. Patient denied SI/HI/AVH, paranoia, and stated that he feels safe here. Patient reported mild shoulder tenderness at LAI site that has resolved. Patient also brought up the concern that he would like to speak to social worker about a new living situation, as he cannot return back to his old apartment.  Principal Problem: Schizoaffective disorder, bipolar type (HCC) Diagnosis: Principal Problem:   Schizoaffective disorder, bipolar type (HCC) Active Problems:   Tobacco use disorder, moderate, dependence   Essential hypertension  Total Time spent with patient: 30 minutes  Past Psychiatric History:  Previous psych diagnosis: Schizophrenia versus schizoaffective disorder - bipolar disorder type Current psych meds: Depakote as well as the every 3 month injection of  paliperidone Past suicide attempts: Cut wrists (cut wrists) Past homicidal behavior: None Past nonsuicidal self-harm: None Psych hospitalizations: [facility] (2003), [facility] (2004), [facility] (2006) x2, [facility] (2016)  [facility] Past psych med trials: Depakote, Risperdal, Abilify  Past Medical History:  Past Medical History:  Diagnosis Date   Anxiety    Depression    Schizoaffective disorder (HCC)    Stroke (HCC)    No past surgical history on file. Family History:  Family History  Problem Relation Age of Onset   Heart disease Mother    Hyperlipidemia Mother    Mental illness Father    Family Psychiatric  History: see H&P   Social History:  Social History   Substance and Sexual Activity  Alcohol Use Yes   Alcohol/week: 1.0 standard drink   Types: 1 Glasses of wine per week     Social History   Substance and Sexual Activity  Drug Use No    Social History   Socioeconomic History   Marital status: Single    Spouse name: Not on file   Number of children: Not on file   Years of education: Not on file   Highest education level: Not on file  Occupational History   Not on file  Tobacco Use   Smoking status: Some Days    Packs/day: 2.00    Types: Cigarettes   Smokeless tobacco: Never  Substance and Sexual Activity   Alcohol use: Yes    Alcohol/week: 1.0 standard drink    Types: 1 Glasses of wine per week   Drug use: No   Sexual activity: Never  Other Topics Concern   Not on file  Social History Narrative   Not on file  Social Determinants of Health   Financial Resource Strain: Not on file  Food Insecurity: Not on file  Transportation Needs: Not on file  Physical Activity: Not on file  Stress: Not on file  Social Connections: Not on file   Additional Social History:  Currently patient lives at section 8 housing, alone.  Stated that he is on disabilities.   Sleep: Good  Appetite:  Good  Current  Medications: Current Facility-Administered Medications  Medication Dose Route Frequency Provider Last Rate Last Admin   acetaminophen (TYLENOL) tablet 650 mg  650 mg Oral Q6H PRN White, Patrice L, NP       alum & mag hydroxide-simeth (MAALOX/MYLANTA) 200-200-20 MG/5ML suspension 30 mL  30 mL Oral Q4H PRN White, Patrice L, NP       benztropine (COGENTIN) tablet 0.5 mg  0.5 mg Oral QHS White, Patrice L, NP   0.5 mg at 03/21/21 2213   divalproex (DEPAKOTE ER) 24 hr tablet 1,000 mg  1,000 mg Oral QHS Nwoko, Agnes I, NP   1,000 mg at 03/21/21 2213   lisinopril (ZESTRIL) tablet 20 mg  20 mg Oral Daily White, Patrice L, NP   20 mg at 03/22/21 4098   And   hydrochlorothiazide (MICROZIDE) capsule 12.5 mg  12.5 mg Oral Daily White, Patrice L, NP   12.5 mg at 03/22/21 0805   risperiDONE (RISPERDAL M-TABS) disintegrating tablet 2 mg  2 mg Oral Q8H PRN Antonieta Pert, MD       And   LORazepam (ATIVAN) tablet 1 mg  1 mg Oral Q6H PRN Antonieta Pert, MD       And   ziprasidone (GEODON) injection 20 mg  20 mg Intramuscular Q12H PRN Antonieta Pert, MD       magnesium hydroxide (MILK OF MAGNESIA) suspension 30 mL  30 mL Oral Daily PRN White, Patrice L, NP       [START ON 03/25/2021] paliperidone (INVEGA SUSTENNA) injection 156 mg  156 mg Intramuscular Once Antonieta Pert, MD       paliperidone (INVEGA) 24 hr tablet 9 mg  9 mg Oral QHS Armandina Stammer I, NP   9 mg at 03/21/21 2213    Lab Results:  No results found for this or any previous visit (from the past 48 hour(s)).   Blood Alcohol level:  Lab Results  Component Value Date   ETH <10 03/14/2021   ETH <5 01/18/2015    Metabolic Disorder Labs: Lab Results  Component Value Date   HGBA1C 5.5 03/17/2021   MPG 111 03/17/2021   MPG 114 01/20/2015   Lab Results  Component Value Date   PROLACTIN 33.3 (H) 03/17/2021   PROLACTIN 26.5 (H) 01/20/2015   Lab Results  Component Value Date   CHOL 182 03/17/2021   TRIG 118 03/17/2021   HDL  56 03/17/2021   CHOLHDL 3.3 03/17/2021   VLDL 24 03/17/2021   LDLCALC 102 (H) 03/17/2021   LDLCALC 115 (H) 12/24/2017    Physical Findings: AIMS: Facial and Oral Movements Muscles of Facial Expression: None, normal Lips and Perioral Area: None, normal Jaw: None, normal Tongue: None, normal,Extremity Movements Upper (arms, wrists, hands, fingers): None, normal Lower (legs, knees, ankles, toes): None, normal, Trunk Movements Neck, shoulders, hips: None, normal, Overall Severity Severity of abnormal movements (highest score from questions above): None, normal Incapacitation due to abnormal movements: None, normal Patient's awareness of abnormal movements (rate only patient's report): No Awareness, Dental Status Current problems with teeth and/or dentures?:  No Does patient usually wear dentures?: No     Musculoskeletal: Strength & Muscle Tone: within normal limits Gait & Station: normal, steady Patient leans: N/A  Psychiatric Specialty Exam:  Presentation  General Appearance: Appropriate for Environment; Casual; Fairly Groomed  Eye Contact:Good  Speech:rambling, clear and coherent  Speech Volume:Normal  Handedness:Right   Mood and Affect  Mood:Euthymic  Affect:Congruent   Thought Process  Thought Processes:Tangential and at times disorganized  Descriptions of Associations:Intact  Orientation:Full (Time, Place and Person)  Thought Content:Denies paranoia and does not make delusional statements; denies ideas of reference or first rank symptoms - Is not grossly responding to internal/external stimuli on exam  History of Schizophrenia/Schizoaffective disorder:Yes  Duration of Psychotic Symptoms:Greater than six months  Hallucinations:Denies AVH  Ideas of Reference:None  Suicidal Thoughts:Suicidal Thoughts: No  Homicidal Thoughts:Homicidal Thoughts: No   Sensorium  Memory:Immediate Good; Recent Fair; Remote  Fair  Judgment:Fair  Insight:Fair   Executive Functions  Concentration:Fair  Attention Span:Fair  Recall:Fair  Fund of Knowledge:Fair  Language:Fair   Psychomotor Activity  Psychomotor Activity:Psychomotor Activity: Normal   Assets  Assets:Communication Skills; Desire for Improvement   Sleep  Sleep:Sleep: Good Number of Hours of Sleep: 7.25    Physical Exam: Physical Exam Vitals and nursing note reviewed.  HENT:     Head: Normocephalic and atraumatic.  Pulmonary:     Effort: Pulmonary effort is normal.  Neurological:     General: No focal deficit present.     Mental Status: He is alert.   Review of Systems  HENT:  Negative for sore throat.   Eyes:  Negative for blurred vision.  Respiratory:  Negative for shortness of breath.   Cardiovascular:  Negative for chest pain.  Gastrointestinal:  Negative for abdominal pain, constipation, diarrhea, nausea and vomiting.  Musculoskeletal:  Negative for myalgias.  Neurological:  Negative for dizziness and headaches.  Blood pressure 133/77, pulse 65, temperature 98.6 F (37 C), temperature source Oral, resp. rate 18, height 5' 8.11" (1.73 m), weight 81.6 kg, SpO2 100 %. Body mass index is 27.28 kg/m.   Treatment Plan Summary: Daily contact with patient to assess and evaluate symptoms and progress in treatment and Medication management  Steven Clayton is a 40 y.o. male, with reported past psychiatric history of schizophrenia versus schizoaffective disorder - bipolar disorder type, and cocaine use. Patient [facility] involuntarily (IVC by mom) [facility] man[facility] and aggression towards [place]. [facility] stay day 5.   PSYCHIATRIC DIAGNOSIS & TREATMENT:  Schizoaffective disorder - bipolar type -Continue paliperidone 9 mg p.o. nightly -Start Hinda GlatterInvega Sustenna 234 mg IM today (03/21/2021) Hinda GlatterInvega Sustenna 156 mg IM is scheduled for 03/25/2021 -Continue Depakote ER 1000 mg  p.o. nightly (7/31) VPA level, CBC and LFTs due 8/3 in the evening before nightly dose -Continue Cogentin 0.5 mg p.o. nightly  Labs:             EKG: QTc 385 (7/25)             TSH 1.898 (7/28)             Lipid panel: LDL 102, otherwise within normal limits (7/28)             VPA level < 10 (7/26)             UA straw-colored (7/26)             UDS positive for cocaine and THC (7/26)  BAL <10 (7/25)             CBC within normal limits (7/25)             CMP: Cr 1.31, otherwise within normal limits (7/25)             PRL 33.3             A1c 5.5  HIV nonreactive  RPR nonreactive  GC/Chlamydia negative    MEDICAL MANAGEMENT: Primary hypertension -Lisinopril 20 mg p.o. daily -HCTZ 12.5 mg p.o. daily  Elevated creatinine (1.31) -Repeating CMP for trending tomorrow   PRN's -Trazadone 50mg  PO qHS PRN for insomnia -Hydroxyzine 25mg  PO TID PRN for anxiety -Alum & mag hydroxide-simeth 21ml PO qHS PRN for GERD -Magnesium hydroxide 59ml PO daily PRN for constipation -Acetaminophen tablet 650mg  PO PRN q6hrs for mild pain   Discharge Planning: -Social work and case management to assist with discharge planning and identification of hospital follow-up needs prior to discharge -Estimated LOS: 3-4 days -Discharge Concerns: Need to establish a safety plan; Medication compliance and effectiveness -Discharge Goals: Return home with outpatient referrals for mental health follow-up including medication management/psychotherapy  Safety and Monitoring: -Involuntary admission to inpatient psychiatric unit for safety, stabilization and treatment -Daily contact with patient to assess and evaluate symptoms and progress in treatment -Patient's case to be discussed in multi-disciplinary team meeting -Observation Level : q15 minute checks -Vital signs: q12 hours -Precautions: suicide, elopement, and assault   31m, DO, PGY-1 03/22/2021, 2:27 PM

## 2021-03-22 NOTE — Progress Notes (Addendum)
   03/22/21 1940  Psych Admission Type (Psych Patients Only)  Admission Status Involuntary  Psychosocial Assessment  Patient Complaints Anxiety;Depression  Eye Contact Fair  Facial Expression Animated;Anxious  Affect Anxious;Appropriate to circumstance  Speech Logical/coherent;Pressured  Interaction Assertive  Motor Activity Restless  Appearance/Hygiene Unremarkable  Behavior Characteristics Cooperative;Anxious  Mood Anxious;Pleasant  Thought Process  Coherency Concrete thinking  Content WDL  Delusions None reported or observed  Perception WDL  Hallucination None reported or observed  Judgment Poor  Confusion None  Danger to Self  Current suicidal ideation? Denies  Danger to Others  Danger to Others None reported or observed   Pt seen in dayroom. Pt denies SI, HI, AVH and pain. Pt denies anxiety and rates depression 5/10. Pt concerned about housing upon discharge. Has spoken to SW. "My mom just wants to know what's going on." Pt told that his mother will likely get a follow-up call when there is something to report on the progress of the situation. "I can't go back to my apartment and need some new housing. You all have been so helpful and nice to me here. I wish I didn't have to leave."

## 2021-03-22 NOTE — Progress Notes (Signed)
Harriett Sine was in the day room much of the evening.  He was pleasant, cooperative and interacted well with staff/peers.  He denied any SI/HI or AVH.  He stated that he had a good day and voiced no complaints at this time.  He took his hs medications without difficulty.  He is currently resting with his eyes closed and appears to be asleep. Q 15 minute checks maintained for safety.   03/21/21 2213  Psych Admission Type (Psych Patients Only)  Admission Status Involuntary  Psychosocial Assessment  Patient Complaints None  Eye Contact Fair  Facial Expression Animated;Anxious  Affect Anxious;Appropriate to circumstance  Speech Logical/coherent  Interaction Assertive  Motor Activity Other (Comment) (unremarkable)  Appearance/Hygiene Unremarkable  Behavior Characteristics Cooperative;Appropriate to situation  Mood Anxious;Pleasant  Thought Process  Coherency Concrete thinking  Content WDL  Delusions None reported or observed  Perception WDL  Hallucination None reported or observed  Judgment Poor  Confusion None  Danger to Self  Current suicidal ideation? Denies  Danger to Others  Danger to Others None reported or observed

## 2021-03-23 DIAGNOSIS — F250 Schizoaffective disorder, bipolar type: Secondary | ICD-10-CM | POA: Diagnosis not present

## 2021-03-23 LAB — CBC WITH DIFFERENTIAL/PLATELET
Abs Immature Granulocytes: 0.01 10*3/uL (ref 0.00–0.07)
Basophils Absolute: 0 10*3/uL (ref 0.0–0.1)
Basophils Relative: 0 %
Eosinophils Absolute: 0.3 10*3/uL (ref 0.0–0.5)
Eosinophils Relative: 5 %
HCT: 36.8 % — ABNORMAL LOW (ref 39.0–52.0)
Hemoglobin: 11.9 g/dL — ABNORMAL LOW (ref 13.0–17.0)
Immature Granulocytes: 0 %
Lymphocytes Relative: 46 %
Lymphs Abs: 3.3 10*3/uL (ref 0.7–4.0)
MCH: 30.2 pg (ref 26.0–34.0)
MCHC: 32.3 g/dL (ref 30.0–36.0)
MCV: 93.4 fL (ref 80.0–100.0)
Monocytes Absolute: 0.5 10*3/uL (ref 0.1–1.0)
Monocytes Relative: 7 %
Neutro Abs: 3 10*3/uL (ref 1.7–7.7)
Neutrophils Relative %: 42 %
Platelets: 230 10*3/uL (ref 150–400)
RBC: 3.94 MIL/uL — ABNORMAL LOW (ref 4.22–5.81)
RDW: 14 % (ref 11.5–15.5)
WBC: 7.2 10*3/uL (ref 4.0–10.5)
nRBC: 0 % (ref 0.0–0.2)

## 2021-03-23 LAB — COMPREHENSIVE METABOLIC PANEL
ALT: 22 U/L (ref 0–44)
AST: 26 U/L (ref 15–41)
Albumin: 4 g/dL (ref 3.5–5.0)
Alkaline Phosphatase: 80 U/L (ref 38–126)
Anion gap: 9 (ref 5–15)
BUN: 13 mg/dL (ref 6–20)
CO2: 31 mmol/L (ref 22–32)
Calcium: 9.3 mg/dL (ref 8.9–10.3)
Chloride: 98 mmol/L (ref 98–111)
Creatinine, Ser: 0.87 mg/dL (ref 0.61–1.24)
GFR, Estimated: >60 mL/min (ref >60)
Glucose, Bld: 110 mg/dL — ABNORMAL HIGH (ref 70–99)
Potassium: 3.8 mmol/L (ref 3.5–5.1)
Sodium: 138 mmol/L (ref 135–145)
Total Bilirubin: 0.3 mg/dL (ref 0.3–1.2)
Total Protein: 6.6 g/dL (ref 6.5–8.1)

## 2021-03-23 LAB — VALPROIC ACID LEVEL: Valproic Acid Lvl: 70 ug/mL (ref 50.0–100.0)

## 2021-03-23 NOTE — [facility] Group Notes (Signed)
[facility] LCSW Group Therapy  03/23/2021 1:47 PM  Type of Therapy:  Group Therapy: Discuss your 5 Ps  Participation Level:  Active  Participation Quality:  Appropriate and Sharing  Affect:  Appropriate  Cognitive:  Disorganized  Insight:  Engaged  Engagement in Therapy:  Engaged  Modes of Intervention:  Discussion and Education  Summary of Progress/Problems: This group discussed each patient's passions, poweres, problems, plans and powers.  Group members discussed what motivates them and the strengths and areas for growth for accomplishing goals in their lives.  Patients also discussed a plan to use their powers (strengths) to overcome obstacles (problems).  Patient's ended group by discussing people in their life that would help them get utilize their powers to lead fulfilling lives.   Patient engaged and interacted in group. At times, patient was disorganized when answering questions but was able to interact and offer input to the group.  Steven Clayton Steven Clayton 03/23/2021, 1:47 PM

## 2021-03-23 NOTE — Tx Team (Addendum)
Interdisciplinary Treatment and Diagnostic Plan Update  03/23/2021 Time of Session: 9:40am Steven Clayton MRN: [id number]  Principal Diagnosis: Schizoaffective disorder, bipolar type (HCC)  Secondary Diagnoses: Principal Problem:   Schizoaffective disorder, bipolar type (HCC) Active Problems:   Tobacco use disorder, moderate, dependence   Essential hypertension   Current Medications:  Current Facility-Administered Medications  Medication Dose Route Frequency Provider Last Rate Last Admin   acetaminophen (TYLENOL) tablet 650 mg  650 mg Oral Q6H PRN White, Patrice L, NP       alum & mag hydroxide-simeth (MAALOX/MYLANTA) 200-200-20 MG/5ML suspension 30 mL  30 mL Oral Q4H PRN White, Patrice L, NP       benztropine (COGENTIN) tablet 0.5 mg  0.5 mg Oral QHS White, Patrice L, NP   0.5 mg at 03/22/21 2056   divalproex (DEPAKOTE ER) 24 hr tablet 1,000 mg  1,000 mg Oral QHS Nwoko, Agnes I, NP   1,000 mg at 03/22/21 2055   lisinopril (ZESTRIL) tablet 20 mg  20 mg Oral Daily White, Patrice L, NP   20 mg at 03/23/21 6440   And   hydrochlorothiazide (MICROZIDE) capsule 12.5 mg  12.5 mg Oral Daily White, Patrice L, NP   12.5 mg at 03/23/21 3474   risperiDONE (RISPERDAL M-TABS) disintegrating tablet 2 mg  2 mg Oral Q8H PRN Antonieta Pert, MD       And   LORazepam (ATIVAN) tablet 1 mg  1 mg Oral Q6H PRN Antonieta Pert, MD       And   ziprasidone (GEODON) injection 20 mg  20 mg Intramuscular Q12H PRN Antonieta Pert, MD       magnesium hydroxide (MILK OF MAGNESIA) suspension 30 mL  30 mL Oral Daily PRN White, Patrice L, NP       [START ON 03/25/2021] paliperidone (INVEGA SUSTENNA) injection 156 mg  156 mg Intramuscular Once Antonieta Pert, MD       paliperidone (INVEGA) 24 hr tablet 9 mg  9 mg Oral QHS Armandina Stammer I, NP   9 mg at 03/22/21 2055   PTA Medications: No medications prior to admission.    Patient Stressors: Marital or family conflict  Patient Strengths: Active sense of  humor  Treatment Modalities: Medication Management, Group therapy, Case management,  1 to 1 session with clinician, Psychoeducation, Recreational therapy.   Physician Treatment Plan for Primary Diagnosis: Schizoaffective disorder, bipolar type (HCC) Long Term Goal(s): Improvement in symptoms so as ready for discharge   Short Term Goals: Ability to identify changes in lifestyle to reduce recurrence of condition will improve Ability to verbalize feelings will improve Ability to demonstrate self-control will improve Ability to identify and develop effective coping behaviors will improve Compliance with prescribed medications will improve Ability to identify triggers associated with substance abuse/mental health issues will improve  Medication Management: Evaluate patient's response, side effects, and tolerance of medication regimen.  Therapeutic Interventions: 1 to 1 sessions, Unit Group sessions and Medication administration.  Evaluation of Outcomes: Progressing  Physician Treatment Plan for Secondary Diagnosis: Principal Problem:   Schizoaffective disorder, bipolar type (HCC) Active Problems:   Tobacco use disorder, moderate, dependence   Essential hypertension  Long Term Goal(s): Improvement in symptoms so as ready for discharge   Short Term Goals: Ability to identify changes in lifestyle to reduce recurrence of condition will improve Ability to verbalize feelings will improve Ability to demonstrate self-control will improve Ability to identify and develop effective coping behaviors will improve Compliance with prescribed medications will  improve Ability to identify triggers associated with substance abuse/mental health issues will improve     Medication Management: Evaluate patient's response, side effects, and tolerance of medication regimen.  Therapeutic Interventions: 1 to 1 sessions, Unit Group sessions and Medication administration.  Evaluation of Outcomes:  Progressing   RN Treatment Plan for Primary Diagnosis: Schizoaffective disorder, bipolar type (HCC) Long Term Goal(s): Knowledge of disease and therapeutic regimen to maintain health will improve  Short Term Goals: Ability to demonstrate self-control, Ability to verbalize feelings will improve, and Ability to identify and develop effective coping behaviors will improve  Medication Management: RN will administer medications as ordered by provider, will assess and evaluate patient's response and provide education to patient for prescribed medication. RN will report any adverse and/or side effects to prescribing provider.  Therapeutic Interventions: 1 on 1 counseling sessions, Psychoeducation, Medication administration, Evaluate responses to treatment, Monitor vital signs and CBGs as ordered, Perform/monitor CIWA, COWS, AIMS and Fall Risk screenings as ordered, Perform wound care treatments as ordered.  Evaluation of Outcomes: Progressing   LCSW Treatment Plan for Primary Diagnosis: Schizoaffective disorder, bipolar type (HCC) Long Term Goal(s): Safe transition to appropriate next level of care at discharge, Engage patient in therapeutic group addressing interpersonal concerns.  Short Term Goals: Engage patient in aftercare planning with referrals and resources, Increase ability to appropriately verbalize feelings, and Facilitate acceptance of mental health diagnosis and concerns  Therapeutic Interventions: Assess for all discharge needs, 1 to 1 time with Social worker, Explore available resources and support systems, Assess for adequacy in community support network, Educate family and significant other(s) on suicide prevention, Complete Psychosocial Assessment, Interpersonal group therapy.  Evaluation of Outcomes: Progressing   Progress in Treatment: Attending groups: Yes. Participating in groups: Yes. Taking medication as prescribed: Yes. Toleration medication: Yes. Family/Significant  other contact made: No, will contact:  CSW will obtain consent from pt Patient understands diagnosis: No. Discussing patient identified problems/goals with staff: Yes. Medical problems stabilized or resolved: Yes. Denies suicidal/homicidal ideation: Yes. Issues/concerns per patient self-inventory: No. Other: None  New problem(s) identified: No, Describe:  None  New Short Term/Long Term Goal(s):medication stabilization, elimination of SI thoughts, development of comprehensive mental wellness plan.   Patient Goals:  "make sure I get out safe."  Discharge Plan or Barriers: Patient recently admitted. CSW will continue to follow and assess for appropriate referrals and possible discharge planning.   Reason for Continuation of Hospitalization: Aggression Mania Medication stabilization  Estimated Length of Stay: 3-5 days  Attendees: Patient: Steven Clayton 03/23/2021   Physician: Rhea Belton, DO 03/23/2021   Nursing:  03/23/2021   RN Care Manager: 03/23/2021   Social Worker: Melba Coon, LCSWA 03/23/2021   Recreational Therapist:  03/23/2021   Other:  03/23/2021   Other:  03/23/2021   Other: 03/23/2021     Scribe for Treatment Team: Aram Beecham, LCSWA 03/23/2021 2:46 PM

## 2021-03-23 NOTE — Plan of Care (Signed)
  Problem: Physical Regulation: Goal: Ability to maintain clinical measurements within normal limits will improve Outcome: Progressing   Problem: Safety: Goal: Periods of time without injury will increase Outcome: Progressing   Problem: Education: Goal: Will be free of psychotic symptoms Outcome: Progressing

## 2021-03-23 NOTE — Progress Notes (Signed)
Adult Psychoeducational Group Note  Date:  03/23/2021 Time:  9:13 PM  Group Topic/Focus:  Wrap-Up Group:   The focus of this group is to help patients review their daily goal of treatment and discuss progress on daily workbooks.  Participation Level:  Active  Participation Quality:  Appropriate  Affect:  Appropriate  Cognitive:  Lacking  Insight: Appropriate  Engagement in Group:  Engaged  Modes of Intervention:  Education  Additional Comments:  Patient attended and participated in group tonight. He reports that his day went well. Everyone has been kind. He is better talking to people. Today he briefly spoke with his doctor.  Lita Mains [facility] 03/23/2021, 9:13 PM

## 2021-03-23 NOTE — Progress Notes (Signed)
Progress note    03/23/21 0742  Psych Admission Type (Psych Patients Only)  Admission Status Involuntary  Psychosocial Assessment  Patient Complaints None  Eye Contact Fair  Facial Expression Animated  Affect Appropriate to circumstance  Speech Logical/coherent  Interaction Assertive  Motor Activity Slow  Appearance/Hygiene Unremarkable  Behavior Characteristics Cooperative;Appropriate to situation  Mood Pleasant  Thought Process  Coherency WDL  Content WDL  Delusions None reported or observed  Perception WDL  Hallucination None reported or observed  Judgment WDL  Confusion None  Danger to Self  Current suicidal ideation? Denies  Danger to Others  Danger to Others None reported or observed

## 2021-03-23 NOTE — Progress Notes (Addendum)
   03/23/21 2100  Psych Admission Type (Psych Patients Only)  Admission Status Involuntary  Psychosocial Assessment  Patient Complaints Anxiety;Depression  Eye Contact Fair  Facial Expression Animated  Affect Appropriate to circumstance  Speech Logical/coherent  Interaction Assertive  Motor Activity Slow  Appearance/Hygiene Unremarkable  Behavior Characteristics Cooperative;Appropriate to situation;Anxious  Mood Anxious;Pleasant  Thought Process  Coherency WDL  Content WDL  Delusions None reported or observed  Perception WDL  Hallucination None reported or observed  Judgment Limited  Confusion None  Danger to Self  Current suicidal ideation? Denies  Danger to Others  Danger to Others None reported or observed   Pt seen in milieu interacting with peers. Pt denies SI, HI, AVH and pain. Pt rates anxiety 5/10 and depression 4/10. Pt still laughing inappropriately at times but this may be anxiety. Pt says that SW is still working on his housing situation. Pt encouraged to look forward to discharge. Pt seems reluctant to want to leave because he likes it here. "I want to be sure I'm ready to go." Pt encouraged to let provider know if he feels he is not ready for discharge but also to have courage to reenter his life and utilize coping skills and resources.

## 2021-03-23 NOTE — Progress Notes (Addendum)
[facility] MD Progress Note  03/23/2021 10:35 AM Steven Clayton  MRN:  [id number] Subjective:   Steven Clayton is a 40 y.o. male, with reported past psychiatric history of schizophrenia versus schizoaffective disorder - bipolar disorder type, and cocaine use. Patient presents involuntarily (IVC by mom) to [facility] (03/17/2021), transferred from [facility] for mania and aggression towards [place]. [facility] stay day 6.   Last night:  Patient is compliant with scheduled meds. PRNs: None  Today (03/23/2021): Patient's affect is congruent, and mood euthymic. Speech is nonpressured and disorganized speech is continuing to improve.   Patient was pleasant on interview.   Patient stated that his mood is "good, rating it 5/10", his appetite is "good", that he slept "excellent".  Patient slept Number of Hours: 5.5.  Patient reported that there was restful, denied nightmares or dreams. Patient stated he is tolerating his medications well, he denied headache, dizziness, blurry vision, abdominal pain, nausea, vomiting, diarrhea, muscle stiffness, involuntary movements. Patient denied SI/HI/AVH, paranoia, and stated that he feels safe here. Patient is alert and oriented x3.  Initially thought it was the 7th month, but knew that it was 2022 and that her president is Duane Boston. Patient stated that he talked with his mom yesterday, and that he is being evicted from his apartment.  That he needs a new place to stay, and is requesting resources from social work about housing.  Principal Problem: Schizoaffective disorder, bipolar type (HCC) Diagnosis: Principal Problem:   Schizoaffective disorder, bipolar type (HCC) Active Problems:   Tobacco use disorder, moderate, dependence   Essential hypertension  Total Time spent with patient: 30 minutes  Past Psychiatric History:  Previous psych diagnosis: Schizophrenia versus schizoaffective disorder - bipolar disorder type Current psych meds: Depakote as well as  the every 3 month injection of paliperidone Past suicide attempts: Cut wrists (cut wrists) Past homicidal behavior: None Past nonsuicidal self-harm: None Psych hospitalizations: [facility] (2003), [facility] (2004), [facility] (2006) x2, [facility] (2016)  [facility] Past psych med trials: Depakote, Risperdal, Abilify  Past Medical History:  Past Medical History:  Diagnosis Date   Anxiety    Depression    Schizoaffective disorder (HCC)    Stroke (HCC)    No past surgical history on file. Family History:  Family History  Problem Relation Age of Onset   Heart disease Mother    Hyperlipidemia Mother    Mental illness Father    Family Psychiatric  History: see H&P   Social History:  Social History   Substance and Sexual Activity  Alcohol Use Yes   Alcohol/week: 1.0 standard drink   Types: 1 Glasses of wine per week     Social History   Substance and Sexual Activity  Drug Use No    Social History   Socioeconomic History   Marital status: Single    Spouse name: Not on file   Number of children: Not on file   Years of education: Not on file   Highest education level: Not on file  Occupational History   Not on file  Tobacco Use   Smoking status: Some Days    Packs/day: 2.00    Types: Cigarettes   Smokeless tobacco: Never  Substance and Sexual Activity   Alcohol use: Yes    Alcohol/week: 1.0 standard drink    Types: 1 Glasses of wine per week   Drug use: No   Sexual activity: Never  Other Topics Concern  Not on file  Social History Narrative   Not on file   Social Determinants of Health   Financial Resource Strain: Not on file  Food Insecurity: Not on file  Transportation Needs: Not on file  Physical Activity: Not on file  Stress: Not on file  Social Connections: Not on file   Additional Social History:  Currently patient lives at section 8 housing, alone.  Stated that he is on disabilities.   Sleep:  Good  Appetite:  Good  Current Medications: Current Facility-Administered Medications  Medication Dose Route Frequency Provider Last Rate Last Admin   acetaminophen (TYLENOL) tablet 650 mg  650 mg Oral Q6H PRN White, Patrice L, NP       alum & mag hydroxide-simeth (MAALOX/MYLANTA) 200-200-20 MG/5ML suspension 30 mL  30 mL Oral Q4H PRN White, Patrice L, NP       benztropine (COGENTIN) tablet 0.5 mg  0.5 mg Oral QHS White, Patrice L, NP   0.5 mg at 03/22/21 2056   divalproex (DEPAKOTE ER) 24 hr tablet 1,000 mg  1,000 mg Oral QHS Nwoko, Agnes I, NP   1,000 mg at 03/22/21 2055   lisinopril (ZESTRIL) tablet 20 mg  20 mg Oral Daily White, Patrice L, NP   20 mg at 03/23/21 8676   And   hydrochlorothiazide (MICROZIDE) capsule 12.5 mg  12.5 mg Oral Daily White, Patrice L, NP   12.5 mg at 03/23/21 7209   risperiDONE (RISPERDAL M-TABS) disintegrating tablet 2 mg  2 mg Oral Q8H PRN Antonieta Pert, MD       And   LORazepam (ATIVAN) tablet 1 mg  1 mg Oral Q6H PRN Antonieta Pert, MD       And   ziprasidone (GEODON) injection 20 mg  20 mg Intramuscular Q12H PRN Antonieta Pert, MD       magnesium hydroxide (MILK OF MAGNESIA) suspension 30 mL  30 mL Oral Daily PRN White, Patrice L, NP       [START ON 03/25/2021] paliperidone (INVEGA SUSTENNA) injection 156 mg  156 mg Intramuscular Once Antonieta Pert, MD       paliperidone (INVEGA) 24 hr tablet 9 mg  9 mg Oral QHS Armandina Stammer I, NP   9 mg at 03/22/21 2055    Lab Results:  No results found for this or any previous visit (from the past 48 hour(s)).   Blood Alcohol level:  Lab Results  Component Value Date   ETH <10 03/14/2021   ETH <5 01/18/2015    Metabolic Disorder Labs: Lab Results  Component Value Date   HGBA1C 5.5 03/17/2021   MPG 111 03/17/2021   MPG 114 01/20/2015   Lab Results  Component Value Date   PROLACTIN 33.3 (H) 03/17/2021   PROLACTIN 26.5 (H) 01/20/2015   Lab Results  Component Value Date   CHOL 182  03/17/2021   TRIG 118 03/17/2021   HDL 56 03/17/2021   CHOLHDL 3.3 03/17/2021   VLDL 24 03/17/2021   LDLCALC 102 (H) 03/17/2021   LDLCALC 115 (H) 12/24/2017    Physical Findings: AIMS: Facial and Oral Movements Muscles of Facial Expression: None, normal Lips and Perioral Area: None, normal Jaw: None, normal Tongue: None, normal,Extremity Movements Upper (arms, wrists, hands, fingers): None, normal Lower (legs, knees, ankles, toes): None, normal, Trunk Movements Neck, shoulders, hips: None, normal, Overall Severity Severity of abnormal movements (highest score from questions above): None, normal Incapacitation due to abnormal movements: None, normal Patient's awareness of abnormal movements (  rate only patient's report): No Awareness, Dental Status Current problems with teeth and/or dentures?: No Does patient usually wear dentures?: No     Musculoskeletal: Strength & Muscle Tone: within normal limits Gait & Station: normal, steady Patient leans: N/A  Psychiatric Specialty Exam:  Presentation  General Appearance: Appropriate for Environment; Casual; Fairly Groomed  Eye Contact:Good  Speech: Rambling, clear and coherent  Speech Volume:Normal  Handedness:Right   Mood and Affect  Mood:Euthymic  Affect:Congruent   Thought Process  Thought Processes:Tangential and at times disorganized  Descriptions of Associations:Intact  Orientation:Full (Time, Place and Person)  Thought Content:Denies paranoia and does not make delusional statements; denies ideas of reference or first rank symptoms - Is not grossly responding to internal/external stimuli on exam  History of Schizophrenia/Schizoaffective disorder:Yes  Duration of Psychotic Symptoms:Greater than six months  Hallucinations:Denies AVH  Ideas of Reference:None  Suicidal Thoughts:Suicidal Thoughts: No  Homicidal Thoughts:Homicidal Thoughts: No   Sensorium  Memory:Immediate Good; Recent Fair; Remote  Fair  Judgment:Fair  Insight:Fair   Executive Functions  Concentration:Fair  Attention Span:Fair  Recall:Fair  Fund of Knowledge:Fair  Language:Fair   Psychomotor Activity  Psychomotor Activity:Psychomotor Activity: Normal   Assets  Assets:Communication Skills; Desire for Improvement   Sleep  Sleep:Sleep: Good Number of Hours of Sleep: 7.25    Physical Exam: Physical Exam Vitals and nursing note reviewed.  HENT:     Head: Normocephalic and atraumatic.  Pulmonary:     Effort: Pulmonary effort is normal.  Neurological:     General: No focal deficit present.     Mental Status: He is alert.   Review of Systems  HENT:  Negative for sore throat.   Eyes:  Negative for blurred vision.  Respiratory:  Negative for shortness of breath.   Cardiovascular:  Negative for chest pain.  Gastrointestinal:  Negative for abdominal pain, constipation, diarrhea, nausea and vomiting.  Musculoskeletal:  Negative for myalgias.  Neurological:  Negative for dizziness and headaches.  Blood pressure 115/83, pulse 66, temperature 97.8 F (36.6 C), temperature source Oral, resp. rate 18, height 5' 8.11" (1.73 m), weight 81.6 kg, SpO2 100 %. Body mass index is 27.28 kg/m.   Treatment Plan Summary: Daily contact with patient to assess and evaluate symptoms and progress in treatment and Medication management  Steven Clayton is a 40 y.o. male, with reported past psychiatric history of schizophrenia versus schizoaffective disorder - bipolar disorder type, and cocaine use. Patient presents involuntarily (IVC by mom) to [facility] (03/17/2021), transferred from [facility] for mania and aggression towards [place]. [facility] stay day 6.   PSYCHIATRIC DIAGNOSIS & TREATMENT:  Schizoaffective disorder - bipolar type -Continue paliperidone 9 mg p.o. nightly -Start Hinda Glatter Sustenna 234 mg IM today (03/21/2021) Hinda Glatter Sustenna 156 mg IM is scheduled for 03/25/2021 -Continue Depakote ER 1000  mg p.o. nightly (7/31) VPA level, CBC and LFTs due 8/3 in the evening before nightly dose -Continue Cogentin 0.5 mg p.o. nightly  Labs:             EKG: QTc 385 (7/25)             TSH 1.898 (7/28)             Lipid panel: LDL 102, otherwise within normal limits (7/28)             VPA level < 10 (7/26)             UA straw-colored (7/26)  UDS positive for cocaine and THC (7/26)             BAL <10 (7/25)             CBC within normal limits (7/25)             CMP: Cr 1.31, otherwise within normal limits (7/25)             PRL 33.3             A1c 5.5  HIV nonreactive  RPR nonreactive  GC/Chlamydia negative    MEDICAL MANAGEMENT: Primary hypertension -Lisinopril 20 mg p.o. daily -HCTZ 12.5 mg p.o. daily (may need to hold if creatinine not improving)  Elevated creatinine (1.31) -Repeating CMP for trending tomorrow and encouraged po hydration   PRN's -Trazodone 50mg  PO qHS PRN for insomnia -Hydroxyzine 25mg  PO TID PRN for anxiety -Alum & mag hydroxide-simeth 30ml PO qHS PRN for GERD -Magnesium hydroxide 30ml PO daily PRN for constipation -Acetaminophen tablet 650mg  PO PRN q6hrs for mild pain   Discharge Planning: -Social work and case management to assist with discharge planning and identification of hospital follow-up needs prior to discharge -Estimated LOS: 3-4 days -Discharge Concerns: Need to establish a safety plan; Medication compliance and effectiveness -Discharge Goals: Return home with outpatient referrals for mental health follow-up including medication management/psychotherapy  Safety and Monitoring: -Involuntary admission to inpatient psychiatric unit for safety, stabilization and treatment -Daily contact with patient to assess and evaluate symptoms and progress in treatment -Patient's case to be discussed in multi-disciplinary team meeting -Observation Level : q15 minute checks -Vital signs: q12 hours -Precautions: suicide, elopement, and  assault   Princess BruinsJulie Nguyen, DO, PGY-1 03/23/2021, 10:35 AM

## 2021-03-23 NOTE — Progress Notes (Signed)
Recreation Therapy Notes  Date: 8.3.22 Time: 1000 Location: [address] Dayroom   Group Topic: Self-esteem   Goal Area(s) Addresses:  Patient will identify and write at least one positive trait about themself. Patient will successfully identify influential people in their life and why they admire them. Patient will acknowledge the benefit of healthy self-esteem. Patient will endorse understanding of ways to increase self-esteem.   Behavioral Response: Engaged   Intervention: Personalized Plate- printed license plate template, markers or colored pencils   Activity:  LRT began group session with open dialogue asking the patients to define self-esteem and verbally identify positive qualities and traits people may possess. Patients were then instructed to design a personalized license plate, with words and drawings, representing at least 3 positive things about themselves. Pts were encouraged to include favorites, things they are proud of, what they enjoy doing, and dreams for their future. If a patient had a life motto or a meaningful phase that expressed their life values, pt's were asked to incorporate that into their design as well. Patients were given the opportunity to share their completed work with the group.    Education: Healthy self-esteem, Positive character traits, Accepting compliments, Leisure as competence and coping, Support Systems, LRT educated patients on the importance of healthy self-esteem and ways to build self-esteem.   Education Outcome:  Acknowledges education/In group clarification offered   Clinical Observations/Feedback: Pt described self esteem as how you feel about yourself.  Pt went on to explain self esteem can be affected by hygiene/keeping your self up and helping others.  Pt described on his plate his like for the Transformers movie and Bumble Bee being his favorite character.  Pt also talked about computers.  Pt had [place] written on the plate because  that's were all the information for the car is kept, wrote journeys because it represents were you're going. Pt identified his dream car as a Lamborg[employer]ini and it would be either red or black.       Caroll Rancher, LRT/CTRS    Caroll Rancher A 03/23/2021 12:13 PM

## 2021-03-24 ENCOUNTER — Other Ambulatory Visit (HOSPITAL_COMMUNITY): Payer: Self-pay

## 2021-03-24 DIAGNOSIS — F250 Schizoaffective disorder, bipolar type: Secondary | ICD-10-CM | POA: Diagnosis not present

## 2021-03-24 NOTE — [facility] Group Notes (Signed)
[facility] Group Notes:  (Nursing/MHT/Case Management/Adjunct)  Date:  03/24/2021  Time:  7:08 PM  Type of Therapy:  Group Therapy  Participation Level:  Active  Participation Quality:  Appropriate and Attentive  Affect:  Appropriate  Cognitive:  Alert and Appropriate  Insight:  Appropriate  Engagement in Group:  Engaged  Modes of Intervention:  Orientation  Summary of Progress/Problems: Pt goal for today is to try to remain positive.   Steven Clayton 03/24/2021, 7:08 PM
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
## 2021-03-24 NOTE — Progress Notes (Signed)
The patient attended the evening Wrap-Up group and was appropriate.  

## 2021-03-24 NOTE — Progress Notes (Signed)
Progress note    03/24/21 0750  Psych Admission Type (Psych Patients Only)  Admission Status Involuntary  Psychosocial Assessment  Patient Complaints Anxiety  Eye Contact Fair  Facial Expression Animated;Anxious  Affect Anxious;Appropriate to circumstance  Speech Logical/coherent  Interaction Assertive  Motor Activity Slow  Appearance/Hygiene Unremarkable  Behavior Characteristics Cooperative;Appropriate to situation  Mood Anxious;Pleasant  Thought Process  Coherency WDL  Content WDL  Delusions None reported or observed  Perception WDL  Hallucination None reported or observed  Judgment Limited  Confusion None  Danger to Self  Current suicidal ideation? Denies  Danger to Others  Danger to Others None reported or observed

## 2021-03-24 NOTE — Progress Notes (Signed)
Recreation Therapy Notes  Date: 8.4.22 Time: 1000 Location: 500 Hall Dayroom   Group Topic: Coping Skills   Goal Area(s) Addresses: Patient will define what a coping skill is. Patient will create a list of healthy coping skills beginning with each letter of the alphabet. Patient will successfully identify positive coping skills they can use post d/c. Patient will acknowledge benefit(s) of using learned coping skills post d/c.     Behavioral Response: Engaged   Intervention: Blank Coping Skills A to Z sheet, Pencils   Activity: Coping A to Z. Patient asked to identify what a coping skill is and when they use them. Patients with Clinical research associate discussed healthy versus unhealthy coping skills. Next patients were given a blank worksheet titled "Coping Skills A-Z".  Partners were instructed to come up with at least one positive coping skill per letter of the alphabet, addressing a specific challenge (ex: stress, anger, anxiety, depression, grief, doubt, isolation, self-harm/suicidal thoughts, substance use). Patients were given 15 minutes to brainstorm before ideas were presented to the large group. Patients and LRT debriefed on the importance of coping skill selection based on situation and back-up plans when a skill tried is not effective. At the end of group, patients were given an handout of alphabetized strategies to keep for future reference.   Education: Pharmacologist, Scientist, physiological, Discharge Planning.   Education Outcome: Acknowledges education/Verbalizes understanding/In group clarification offered/Additional education needed   Clinical Observations/Feedback: Pt was engaged and attentive.  Pt described coping skills as ways of dealing with everyday life with things like medication.  Pt was called out of group to meet with doctor but later returned.  Pt came up with coping skills for stress.  Some of patient's coping skills didn't really make sense but pt was focused.  Pt came up coping skills  like balance, answers, rescue, future, jokes, money, treasures and winners.       Caroll Rancher, LRT/CTRS      Caroll Rancher A 03/24/2021 12:11 PM

## 2021-03-24 NOTE — Plan of Care (Signed)
  Problem: Activity: Goal: Will verbalize the importance of balancing activity with adequate rest periods Outcome: Progressing   Problem: Education: Goal: Knowledge of the prescribed therapeutic regimen will improve Outcome: Progressing   Problem: Coping: Goal: Coping ability will improve Outcome: Progressing   

## 2021-03-24 NOTE — BHH Group Notes (Signed)
Occupational Therapy Group Note Date: 03/24/2021 Group Topic/Focus: Coping Skills  Group Description: Group Description: Group encouraged increased engagement and participation through discussion focused on coping through music. Group members were encouraged to create a "coping skills playlist" that consisted of 20 songs with different cited categories/topics including "a song that reminds you of a good memory" and "a song that makes you want to dance", etc. Discussion followed with patients sharing their playlists and choosing one for the group to listen and respond too.   Therapeutic Goal(s): Identify positive songs to improve coping through music Identify and share benefit of music as a coping strategy  Participation Level: Active   Participation Quality: Independent   Behavior: Calm, Cooperative, and Interactive   Speech/Thought Process: Focused   Affect/Mood: Euthymic   Insight: Limited   Judgement: Limited   Individualization: Steven Clayton was active in their participation of group discussion/activity. Pt engaged appropriately, creating his coping skills playlist and identified his favorite genre of music as "r and b". Receptive to education provided on music as a coping strategy and shared a song from his playlist "a song that makes me feel happy" which he identified as Happy by [place]  Modes of Intervention: Activity, Discussion, Education, and Socialization  Patient Response to Interventions:  Attentive, Engaged, Receptive, and Interested   Plan: Continue to engage patient in OT groups 2 - 3x/week.  03/24/2021  Donne Hazel, MOT, OTR/L

## 2021-03-25 DIAGNOSIS — F250 Schizoaffective disorder, bipolar type: Secondary | ICD-10-CM | POA: Diagnosis not present

## 2021-03-25 NOTE — Progress Notes (Signed)
Steven Clayton was up and visible on the unit.  He attended evening wrap up group.  He denied any pain or discomfort and appeared to be in no physical distress.  He took his hs medications without difficulty.  Q 15 minute checks maintained for safety.     03/24/21 2054  Psych Admission Type (Psych Patients Only)  Admission Status Involuntary  Psychosocial Assessment  Patient Complaints Anxiety  Eye Contact Fair  Facial Expression Animated;Anxious  Affect Anxious;Appropriate to circumstance  Speech Logical/coherent  Interaction Assertive  Motor Activity Other (Comment) (unremarkable)  Appearance/Hygiene Unremarkable  Behavior Characteristics Cooperative;Appropriate to situation  Mood Anxious;Pleasant  Thought Process  Coherency WDL  Content WDL  Delusions None reported or observed  Perception WDL  Hallucination None reported or observed  Judgment Limited  Confusion None  Danger to Self  Current suicidal ideation? Denies  Danger to Others  Danger to Others None reported or observed
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
## 2021-03-25 NOTE — B[facility] Group Notes (Signed)
Due to the acuity, staffing and complex discharge plans, group was not held. Patient was provided therapeutic worksheets and asked to meet with CSW as needed.   Adi Seales, LCSWA Clinicial Social Worker [facility]  

## 2021-03-25 NOTE — Progress Notes (Signed)
Recreation Therapy Notes  Date: 8.5.22 Time: 1000 Location: 500 Hall Dayroom  Group Topic: Leisure Education  Goal Area(s) Addresses:  Patient will identify positive leisure and recreation activities.  Patient will identify one positive benefit of participation in leisure activities.   Behavioral Response: Engaged  Intervention: Group Game  Activity: Leisure IT trainer. Patients were divided in to 2 groups for game play. LRT used small strips of paper with 1 listed leisure or recreation activity. Patients took turns randomly drawing a slip of paper and drawing the identified activity on the board without using words or sounds. Points were awarded to the team with the first correct guess. After several rounds of game play, team with the most points were declared the winners.   Education:  Teacher, English as a foreign language, Special educational needs teacher, Discharge Planning  Education Outcome: Acknowledges education/In group clarification offered/Needs additional education  Clinical Observations/Feedback: Pt was very attentive and animated during group session.  Pt made many attempts to guess the drawings and took a lot of turns drawing at well.  Pt was bright and fully participated in group session.    Caroll Rancher, LRT/CTRS         Caroll Rancher A 03/25/2021 11:58 AM

## 2021-03-25 NOTE — Progress Notes (Signed)
Progress note  Pt provided their second long acting injectable. Pt continues to improve.     03/25/21 0750  Psych Admission Type (Psych Patients Only)  Admission Status Involuntary  Psychosocial Assessment  Patient Complaints None  Eye Contact Fair  Facial Expression Animated  Affect Appropriate to circumstance  Speech Logical/coherent  Interaction Assertive  Motor Activity Slow  Appearance/Hygiene Unremarkable  Behavior Characteristics Cooperative;Appropriate to situation  Mood Pleasant  Thought Process  Coherency WDL  Content WDL  Delusions WDL  Perception WDL  Hallucination None reported or observed  Judgment Limited  Confusion None  Danger to Self  Current suicidal ideation? Denies  Danger to Others  Danger to Others None reported or observed

## 2021-03-25 NOTE — Plan of Care (Signed)
  Problem: Coping: Goal: Will verbalize feelings Outcome: Progressing   Problem: Health Behavior/Discharge Planning: Goal: Compliance with prescribed medication regimen will improve Outcome: Progressing   Problem: Nutritional: Goal: Ability to achieve adequate nutritional intake will improve Outcome: Progressing   

## 2021-03-25 NOTE — Progress Notes (Signed)
Adult Psychoeducational Group Note  Date:  03/25/2021 Time:  10:33 PM  Group Topic/Focus:  Wrap-Up Group:   The focus of this group is to help patients review their daily goal of treatment and discuss progress on daily workbooks.  Participation Level:  Active  Participation Quality:  Appropriate  Affect:  Anxious and Appropriate  Cognitive:  Disorganized and Confused  Insight: Limited  Engagement in Group:  Limited  Modes of Intervention:  Discussion  Additional Comments:  Pt stated his goal for today was to focus on his treatment plan. Pt stated he accomplished his goal today. Pt stated he could not remember if he talked with his doctor or his social worker about his care today. Pt rated his overall day a 10. Pt stated he was able to contact his mother today which improve his overall day. Pt stated he felt better about himself today. Pt stated he was able to attend all meals. Pt stated he took all medications provided today. Pt stated he attend all groups held today. Pt stated his appetite was pretty good today. Pt rated sleep last night was pretty good. Pt stated the goal tonight was to get some rest. Pt stated he had some physical pain tonight. Pt stated he had some mild pain in his lower back. Pt stated after he used the restroom their was no more pain. Pt nurse was updated on situation. Pt deny visual hallucinations and auditory issues tonight. Pt denies thoughts of harming himself or others. Pt stated he would alert staff if anything changed.  Felipa Furnace 03/25/2021, 10:33 PM

## 2021-03-25 NOTE — [facility] Suicide Risk Assessment (Signed)
[facility] INPATIENT:  Family/Significant Other Suicide Prevention Education  Suicide Prevention Education:  Education Completed; mother Steven Clayton [phone]), has been identified by the patient as the family member/significant other with whom the patient will be residing, and identified as the person(s) who will aid the patient in the event of a mental health crisis (suicidal ideations/suicide attempt).  With written consent from the patient, the family member/significant other has been provided the following suicide prevention education, prior to the and/or following the discharge of the patient.    CSW spoke with this pt's mother who shared that she does not feel that this pt is at his baseline yet. She states he is not able to return to his apartment and reports that she will be dropping off [employer] paperwork today to have pt complete. She states pt has a voucher, however needs to find a new residence with this voucher. She does not feel that this pt would be safe if discharged to a shelter.  The suicide prevention education provided includes the following: Suicide risk factors Suicide prevention and interventions National Suicide Hotline telephone number [facility] assessment telephone number [facility] Emergency Assistance 911 [facility] and/or Residential Mobile Crisis Unit telephone number  Request made of family/significant other to: Remove weapons (e.g., guns, rifles, knives), all items previously/currently identified as safety concern.   Remove drugs/medications (over-the-counter, prescriptions, illicit drugs), all items previously/currently identified as a safety concern.  The family member/significant other verbalizes understanding of the suicide prevention education information provided.  The family member/significant other agrees to remove the items of safety concern listed above.  Megan Presti A Raelynne Ludwick 03/25/2021, 10:36 AM

## 2021-03-25 NOTE — [facility] Group Notes (Signed)
[facility] Group Notes:  (Nursing/MHT/Case Management/Adjunct)  Date:  03/25/2021  Time:  3:17 PM  Type of Therapy:  Group Therapy  Participation Level:  Active  Participation Quality:  Appropriate  Affect:  Appropriate  Cognitive:  Appropriate  Insight:  Appropriate  Engagement in Group:  Engaged  Modes of Intervention:  Orientation  Summary of Progress/Problems: Pt goal for today is to be able to be happy.   Steven Clayton 03/25/2021, 3:17 PM
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
## 2021-03-26 DIAGNOSIS — F250 Schizoaffective disorder, bipolar type: Secondary | ICD-10-CM | POA: Diagnosis not present

## 2021-03-26 MED ORDER — ZIPRASIDONE MESYLATE 20 MG IM SOLR: 20.0000 mg | INTRAMUSCULAR | Status: DC | PRN

## 2021-03-26 MED ORDER — HALOPERIDOL 5 MG PO TABS
5.0000 mg | ORAL_TABLET | Freq: Two times a day (BID) | ORAL | Status: DC
  Administered 2021-03-26 – 2021-03-30 (×9): 5 mg via ORAL
  Filled 2021-03-26 (×12): qty 1

## 2021-03-26 MED ORDER — LORAZEPAM 1 MG PO TABS: 1.0000 mg | ORAL_TABLET | ORAL | Status: DC | PRN

## 2021-03-26 MED ORDER — RISPERIDONE 2 MG PO TBDP: 2.0000 mg | ORAL_TABLET | Freq: Three times a day (TID) | ORAL | Status: DC | PRN

## 2021-03-26 NOTE — Progress Notes (Signed)
Adult Psychoeducational Group Note  Date:  03/26/2021 Time:  11:13 PM  Group Topic/Focus:  Wrap-Up Group:   The focus of this group is to help patients review their daily goal of treatment and discuss progress on daily workbooks.  Participation Level:  Active  Participation Quality:  Appropriate  Affect:  Appropriate  Cognitive:  Appropriate  Insight: Appropriate  Engagement in Group:  Developing/Improving  Modes of Intervention:  Discussion  Additional Comments:  Pt stated his goal for today was to focus on his treatment plan. Pt stated he accomplished his goal today. Pt he talked with his doctor and his social worker about his care today. Pt rated his overall day a  7 out of 10. Pt stated he was able to contact his mother today which improve his overall day. Pt stated he felt better about himself today. Pt stated he was able to attend all meals. Pt stated he took all medications provided today. Pt stated he attend all groups held today. Pt stated his appetite was pretty good today. Pt rated sleep last night was pretty good. Pt stated the goal tonight was to get some rest. Pt stated he had some physical pain tonight. Pt stated he had some mild pain in his lower back. Pt rated the mild pain in his lower back a 2 on the pain level scale. Pt nurse was updated on situation. Pt deny visual hallucinations and auditory issues tonight. Pt denies thoughts of harming himself or others. Pt stated he would alert staff if anything changed.  Felipa Furnace 03/26/2021, 11:13 PM

## 2021-03-26 NOTE — Progress Notes (Signed)
   03/25/21 2100  Psych Admission Type (Psych Patients Only)  Admission Status Involuntary  Psychosocial Assessment  Patient Complaints None  Eye Contact Fair  Facial Expression Animated  Affect Appropriate to circumstance  Speech Logical/coherent  Interaction Assertive  Motor Activity Slow  Appearance/Hygiene Unremarkable  Behavior Characteristics Cooperative;Appropriate to situation  Mood Pleasant  Thought Process  Coherency WDL  Content WDL  Delusions None reported or observed  Perception WDL  Hallucination None reported or observed  Judgment Limited  Confusion None  Danger to Self  Current suicidal ideation? Denies  Danger to Others  Danger to Others None reported or observed

## 2021-03-26 NOTE — [facility] Group Notes (Signed)
Group therapy was not held due to high acuity on the unit, almost nobody able to attend.  Ambrose Mantle, LCSW 03/26/2021, 3:31 PM

## 2021-03-26 NOTE — Progress Notes (Addendum)
[facility] MD Progress Note  03/26/2021 6:21 PM Steven Clayton  MRN:  [id number]  CC: Mania, aggression  Subjective:   Steven Clayton is a 40 y.o. male, with reported past psychiatric history of schizophrenia versus schizoaffective disorder - bipolar disorder type, and cocaine use. Patient presents involuntarily (IVC by mom) to [facility] (03/17/2021), transferred from [facility] for mania and aggression towards [place]. [facility] stay day 9.   24hr events:  The patient's chart was reviewed and nursing notes were reviewed. The patient's case was discussed in multidisciplinary team meeting.  Per MAR: - Patient is compliant with scheduled meds. - PRNs: None required for agitation Per RN notes, patient has attended groups. Patient slept Number of Hours: 7.25.   Today (03/26/2021): The patient was seen and evaluated on the unit.  Pt states his mood is "fine" . Pt rates his mood at 7/10 (10 is the best mood). Pt slept well last night. Nursing notes indicate that Pt slept for 7.25 hours. Pt states his appetite is good. Pt rates his anxiety at 7/10 (10 is the worst anxiety) Currently, Pt denies any suicidal ideation, homicidal ideation  Pt endorses visual hallucinations states he can see things what other people cannot see, unable to describe.  Patient thought process is loose and disorganized.  He jumps from 1 topic to another, he talks about "[employer]" "eye lenses", "eyes" "psycho babble".  When asked about auditory hallucination patient states " you mean like dog's voice or bird voice".  Patient not able to describe the content of auditory hallucinations.  Stable where he is right now, patient states" [place] like Bible. ". Patient laughs inappropriately and have increased hand movements.  Pt denies any headache, nausea, vomiting, dizziness, chest pain, SOB, abdominal pain, diarrhea, and constipation. Pt denies any medication side effects and has been tolerating it well. Pt denies any concerns.     Principal Problem: Schizoaffective disorder, bipolar type (HCC) Diagnosis: Principal Problem:   Schizoaffective disorder, bipolar type (HCC) Active Problems:   Tobacco use disorder, moderate, dependence   Essential hypertension  Total Time spent with patient: 30 minutes  Past Psychiatric History:  Previous psych diagnosis: Schizophrenia versus schizoaffective disorder - bipolar disorder type Current psych meds: Depakote as well as the every 3 month injection of paliperidone Past suicide attempts: Cut wrists (cut wrists) Past homicidal behavior: None Past nonsuicidal self-harm: None Psych hospitalizations: [facility] (2003), [facility] (2004), [facility] (2006) x2, [facility] (2016)  [facility] Past psych med trials: Depakote, Risperdal, Abilify  Past Medical History:  Past Medical History:  Diagnosis Date   Anxiety    Depression    Schizoaffective disorder (HCC)    Stroke (HCC)    No past surgical history on file. Family History:  Family History  Problem Relation Age of Onset   Heart disease Mother    Hyperlipidemia Mother    Mental illness Father    Family Psychiatric  History: see H&P   Social History:  Social History   Substance and Sexual Activity  Alcohol Use Yes   Alcohol/week: 1.0 standard drink   Types: 1 Glasses of wine per week     Social History   Substance and Sexual Activity  Drug Use No    Social History   Socioeconomic History   Marital status: Single    Spouse name: Not on file   Number of children: Not on file   Years of education: Not on file  Highest education level: Not on file  Occupational History   Not on file  Tobacco Use   Smoking status: Some Days    Packs/day: 2.00    Types: Cigarettes   Smokeless tobacco: Never  Substance and Sexual Activity   Alcohol use: Yes    Alcohol/week: 1.0 standard drink    Types: 1 Glasses of wine per week   Drug use: No   Sexual activity: Never   Other Topics Concern   Not on file  Social History Narrative   Not on file   Social Determinants of Health   Financial Resource Strain: Not on file  Food Insecurity: Not on file  Transportation Needs: Not on file  Physical Activity: Not on file  Stress: Not on file  Social Connections: Not on file   Additional Social History:  Currently patient lives at section 8 housing, alone.  Stated that he is on disabilities.   Sleep: Good  Appetite:  Good  Current Medications: Current Facility-Administered Medications  Medication Dose Route Frequency Provider Last Rate Last Admin   acetaminophen (TYLENOL) tablet 650 mg  650 mg Oral Q6H PRN White, Patrice L, NP       alum & mag hydroxide-simeth (MAALOX/MYLANTA) 200-200-20 MG/5ML suspension 30 mL  30 mL Oral Q4H PRN White, Patrice L, NP       benztropine (COGENTIN) tablet 0.5 mg  0.5 mg Oral QHS White, Patrice L, NP   0.5 mg at 03/25/21 2053   divalproex (DEPAKOTE ER) 24 hr tablet 1,000 mg  1,000 mg Oral QHS Nwoko, Agnes I, NP   1,000 mg at 03/25/21 2052   haloperidol (HALDOL) tablet 5 mg  5 mg Oral BID Karsten Ro, MD   5 mg at 03/26/21 1720   lisinopril (ZESTRIL) tablet 20 mg  20 mg Oral Daily White, Patrice L, NP   20 mg at 03/26/21 9485   And   hydrochlorothiazide (MICROZIDE) capsule 12.5 mg  12.5 mg Oral Daily White, Patrice L, NP   12.5 mg at 03/26/21 4627   magnesium hydroxide (MILK OF MAGNESIA) suspension 30 mL  30 mL Oral Daily PRN White, Patrice L, NP       risperiDONE (RISPERDAL M-TABS) disintegrating tablet 2 mg  2 mg Oral Q8H PRN Antonieta Pert, MD        Lab Results:  No results found for this or any previous visit (from the past 48 hour(s)).    Blood Alcohol level:  Lab Results  Component Value Date   ETH <10 03/14/2021   ETH <5 01/18/2015    Metabolic Disorder Labs: Lab Results  Component Value Date   HGBA1C 5.5 03/17/2021   MPG 111 03/17/2021   MPG 114 01/20/2015   Lab Results  Component Value Date    PROLACTIN 33.3 (H) 03/17/2021   PROLACTIN 26.5 (H) 01/20/2015   Lab Results  Component Value Date   CHOL 182 03/17/2021   TRIG 118 03/17/2021   HDL 56 03/17/2021   CHOLHDL 3.3 03/17/2021   VLDL 24 03/17/2021   LDLCALC 102 (H) 03/17/2021   LDLCALC 115 (H) 12/24/2017    Physical Findings: AIMS: Facial and Oral Movements Muscles of Facial Expression: None, normal Lips and Perioral Area: None, normal Jaw: None, normal Tongue: None, normal,Extremity Movements Upper (arms, wrists, hands, fingers): None, normal Lower (legs, knees, ankles, toes): None, normal, Trunk Movements Neck, shoulders, hips: None, normal, Overall Severity Severity of abnormal movements (highest score from questions above): None, normal Incapacitation due to abnormal movements:  None, normal Patient's awareness of abnormal movements (rate only patient's report): No Awareness, Dental Status Current problems with teeth and/or dentures?: No Does patient usually wear dentures?: No     Musculoskeletal: Strength & Muscle Tone: within normal limits Gait & Station: normal, steady Patient leans: N/A  Psychiatric Specialty Exam: Physical Exam Vitals and nursing note reviewed.  HENT:     Head: Normocephalic and atraumatic.  Pulmonary:     Effort: Pulmonary effort is normal.  Neurological:     General: No focal deficit present.     Mental Status: He is alert and oriented to person, place, and time.    Review of Systems  Respiratory:  Negative for shortness of breath.   Cardiovascular:  Negative for chest pain.  Gastrointestinal:  Negative for abdominal pain, constipation, diarrhea and nausea.  Neurological:  Negative for dizziness and headaches.   Blood pressure (!) 122/96, pulse 77, temperature 98.2 F (36.8 C), temperature source Oral, resp. rate 18, height 5' 8.11" (1.73 m), weight 81.6 kg, SpO2 100 %.Body mass index is 27.28 kg/m.  General Appearance: Casual and Fairly Groomed  Eye Contact:  Good   Speech:  Clear and Coherent and Normal Rate  Volume:  Normal  Mood:  Mildly elevated  Affect:  Labile with periods of inappropriate laughter  Thought Process:  Disorganized and Descriptions of Associations: Loose,  Orientation:  Full (Time, Place, and Person)  Thought Content:  Hallucinations: Auditory Visual Not able to describe states he sees what other people cannot see. (Denied AVH on attending exam) - Is not grossly responding to internal/external stimuli on exam  Suicidal Thoughts:  No  Homicidal Thoughts:  No  Memory: Fair  Judgement:  Impaired  Insight:  Lacking  Psychomotor Activity: Fidgety   Concentration:  Concentration: Fair and Attention Span: Fair  Recall:  [employer]Fair  Fund of Knowledge:  Fair  Language:  Fair  Akathisia:  No  Assets:  Communication Skills Desire for Improvement Social Support  ADL's:  Intact  Cognition:  WNL  Sleep:  Number of Hours: 7.25   Treatment Plan Summary: Daily contact with patient to assess and evaluate symptoms and progress in treatment and Medication management  Steven Clayton is a 40 y.o. male, with reported past psychiatric history of schizophrenia versus schizoaffective disorder - bipolar disorder type, and cocaine use. [facility] [place] and [place] [facility]   TODAY (03/26/2021): Patient is disorganized and is thought process is loose.  Patient jumps from 1 topic to another.  And talk about irrelevant things.  Patient laughs inappropriately. Diagnoses: - Schizoaffective disorder - bipolar type - Primary hypertension - Elevated creatinine-RESOLVED  PSYCHIATRIC DIAGNOSIS & TREATMENT:  Schizoaffective disorder - bipolar type -Stop Paliperidone oral.  -Start 5 mg Haldol PO twice daily (discussed with patient and he consents to medication change) recheck EKG in coming days while on 2 antipsychotics -Invega  Sustenna 234 mg IM (03/21/2021), Hinda GlatterInvega Sustenna 156 mg IM received 03/25/2021 -Continue Depakote ER 1000 mg p.o. nightly (7/31) VPA level 70 (8/3) CBC:Hb 11.9/36.8, WBC 7.2 and platelets 230 (03/23/21) CMP: AST 26, ALT 22 within normal limits (8/3) -Continue Cogentin 0.5 mg p.o. nightly  Labs:             EKG: QTc 385 (7/25); QTc 399 (03/23/21)             TSH 1.898 (7/28)  Lipid panel: LDL 102, otherwise within normal limits (7/28)             VPA level < 10 (7/26)             UA straw-colored (7/26)             UDS positive for cocaine and THC (7/26)             BAL <10 (7/25)             CBC within normal limits (7/25)             CMP: Cr 1.31, otherwise within normal limits (7/25) Cr 0.87 (03/23/21)             PRL 33.3 (7/30)             A1c 5.5 (7/30)  HIV nonreactive (7/31)  RPR nonreactive (7/31)  GC/Chlamydia negative (7/30)  Justification for Use of 2 antipsychotics: According to notes, patient has tried and failed previous Invega sustenna/trinza and Risperdal po and consta, and has been given Zyprexa previously.    MEDICAL MANAGEMENT: Primary hypertension -Lisinopril 20 mg p.o. daily -HCTZ 12.5 mg p.o. daily   Elevated creatinine (1.31)-RESOLVED CMP: Cr 0.87 (8/3) -Continue to encourage patient to continue p.o. fluid intake  PRN's -Trazodone 50mg  PO qHS PRN for insomnia -Hydroxyzine 25mg  PO TID PRN for anxiety -Alum & mag hydroxide-simeth 45ml PO qHS PRN for GERD -Magnesium hydroxide 20ml PO daily PRN for constipation -Acetaminophen tablet 650mg  PO PRN q6hrs for mild pain   Discharge Planning: -Social work and case management to assist with discharge planning and identification of hospital follow-up needs prior to discharge -Estimated LOS: 3-4 days -Discharge Concerns: Need to establish a safety plan; Medication compliance and effectiveness -Discharge Goals: Return home with outpatient referrals for mental health follow-up including medication  management/psychotherapy  Safety and Monitoring: -Involuntary admission to inpatient psychiatric unit for safety, stabilization and treatment -Daily contact with patient to assess and evaluate symptoms and progress in treatment -Patient's case to be discussed in multi-disciplinary team meeting -Observation Level : q15 minute checks -Vital signs: q12 hours -Precautions: suicide, elopement, and assault  31m, MD, PGY-2 03/26/2021, 6:21 PM

## 2021-03-26 NOTE — [facility] Group Notes (Signed)
SPIRITUALITY GROUP NOTE   Spirituality group facilitated by Kathleen Argue, [facility].   Group Description: Group focused on topic of hope. Patients participated in facilitated discussion around topic, connecting with one another around experiences and definitions for hope. Group members engaged with visual explorer photos, reflecting on what hope looks like for them today. Group engaged in discussion around how their definitions of hope are present today in hospital.   Modalities: Psycho-social ed, Adlerian, Narrative, MI   Patient Progress: Steven Clayton attended group and was engaged in the conversation.  At times his comments were difficult to understand, but overall he reported that he felt the group was very helpful to him.  Chaplain Dyanne Carrel, B[facility] Pager, [phone] 12:45 PM

## 2021-03-27 DIAGNOSIS — F250 Schizoaffective disorder, bipolar type: Secondary | ICD-10-CM | POA: Diagnosis not present

## 2021-03-27 MED ORDER — BENZTROPINE MESYLATE 0.5 MG PO TABS
0.5000 mg | ORAL_TABLET | Freq: Two times a day (BID) | ORAL | Status: DC
  Administered 2021-03-27 – 2021-04-01 (×10): 0.5 mg via ORAL
  Filled 2021-03-27 (×14): qty 1

## 2021-03-27 NOTE — Progress Notes (Signed)
Adult Psychoeducational Group Note  Date:  03/27/2021 Time:  9:14 PM  Group Topic/Focus:  Wrap-Up Group:   The focus of this group is to help patients review their daily goal of treatment and discuss progress on daily workbooks.  Participation Level:  Active  Participation Quality:  Appropriate and Supportive  Affect:  Appropriate  Cognitive:  Alert and Appropriate  Insight: Appropriate  Engagement in Group:  Supportive  Modes of Intervention:  Discussion and Support  Additional Comments: Discussion of symptoms, supportive counseling, identification and exploration of emotions, Psychoeducation: Review and discuss patient's goals providing guidelines or information on contraindications for use if appropriate. Provide members personalized feedback based on assessment measures--end of Wrap-Up Group.     Nicoletta Dress 03/27/2021, 9:14 PM

## 2021-03-27 NOTE — Progress Notes (Signed)
Pt slightly irritable this morning on initial approach related to peer's actions on unit "She keep messing everything up here".  Remains delusional, disorganized believes people think he's talking about Tim Lair, her children and Amada Kingfisher "I will never do that. Those are very good [place] people and I just love them to death. Amada Kingfisher has been my friend and I love her music, why will I do that, I mean that's crayzy. I'm fine now" with inappropriate laughter. Pt tolerates all his medications, meals and fluids well. Safety checks maintained at Q 15 minutes intervals without self harm gestures. Support and encouragement offered to foster compliance with current regimen. Pt attended scheduled group. Remains safe on and off unit without self harm gestures or outburst.

## 2021-03-27 NOTE — [facility] Group Notes (Signed)
[facility] LCSW Group Therapy Note  Date/Time:  03/27/2021  11:00AM-12:00PM  Type of Therapy and Topic:  Group Therapy:  Music and Mood  Participation Level:  Active   Description of Group: In this process group, members listened to a variety of genres of music and identified that different types of music evoke different responses.  Patients were encouraged to identify music that was soothing for them and music that was energizing for them.  Patients discussed how this knowledge can help with wellness and recovery in various ways including managing depression and anxiety as well as encouraging healthy sleep habits.    Therapeutic Goals: Patients will explore the impact of different varieties of music on mood Patients will verbalize the thoughts they have when listening to different types of music Patients will identify music that is soothing to them as well as music that is energizing to them Patients will discuss how to use this knowledge to assist in maintaining wellness and recovery Patients will explore the use of music as a coping skill  Summary of Patient Progress:  At the beginning of group, patient expressed that he felt "alright."  He reacted very positively to the music.  At the end of group, patient expressed that he felt "a lot better."    Therapeutic Modalities: Solution Focused Brief Therapy Activity   Ambrose Mantle, LCSW

## 2021-03-27 NOTE — Progress Notes (Addendum)
[facility] MD Progress Note  03/27/2021 11:51 AM Steven Clayton  MRN:  [id number]  CC: Mania, aggression  Subjective:   Steven Clayton is a 40 y.o. male, with reported past psychiatric history of schizophrenia versus schizoaffective disorder - bipolar disorder type, and cocaine use. Patient presents involuntarily (IVC by mom) to [facility] (03/17/2021), transferred from [facility] for mania and aggression towards [place]. [facility] stay day 10.   24hr events:  The patient's chart was reviewed and nursing notes were reviewed. The patient's case was discussed in multidisciplinary team meeting.  Per MAR: - Patient is compliant with scheduled meds.  - PRNs: None required for agitation Per RN notes, patient has attended groups.  RN also commented that another patient last night was agitating this patient, and the patient got frustrated and went to his room from the day room.  RN reported that the patient did not become physically or verbally violent. Patient slept Number of Hours: 8.   Today (03/27/2021): The patient was seen and evaluated on the unit.  Pt states his mood is "fine", appetite is "good", sleep is "excellent".  Patient ruminates and displayed flight of tideas in regards to his past weight and family members weight, medical professions, his obese friend, his mother, and his friend that was "all cut up by a doctor". Pt denies any headache, nausea, vomiting, dizziness, chest pain, SOB, abdominal pain, diarrhea, constipation, muscle stiffness, involuntary movements. Pt denies any medication side effects and has been tolerating it well. Pt denies any concerns.   Patient denied SI/HI/AVH, delusions, paranoia, first rank symptoms.   Principal Problem: Schizoaffective disorder, bipolar type (HCC) Diagnosis: Principal Problem:   Schizoaffective disorder, bipolar type (HCC) Active Problems:   Tobacco use disorder, moderate, dependence   Essential hypertension  Total Time spent with  patient: 30 minutes  Past Psychiatric History:  Previous psych diagnosis: Schizophrenia versus schizoaffective disorder - bipolar disorder type Current psych meds: Depakote as well as the every 3 month injection of paliperidone Past suicide attempts: Cut wrists (cut wrists) Past homicidal behavior: None Past nonsuicidal self-harm: None Psych hospitalizations: [facility] (2003), [facility] (2004), [facility] (2006) x2, [facility] (2016)  [facility] Past psych med trials: Depakote, Risperdal, Abilify  Past Medical History:  Past Medical History:  Diagnosis Date   Anxiety    Depression    Schizoaffective disorder (HCC)    Stroke (HCC)    No past surgical history on file. Family History:  Family History  Problem Relation Age of Onset   Heart disease Mother    Hyperlipidemia Mother    Mental illness Father    Family Psychiatric  History: see H&P   Social History:  Social History   Substance and Sexual Activity  Alcohol Use Yes   Alcohol/week: 1.0 standard drink   Types: 1 Glasses of wine per week     Social History   Substance and Sexual Activity  Drug Use No    Social History   Socioeconomic History   Marital status: Single    Spouse name: Not on file   Number of children: Not on file   Years of education: Not on file   Highest education level: Not on file  Occupational History   Not on file  Tobacco Use   Smoking status: Some Days    Packs/day: 2.00    Types: Cigarettes   Smokeless tobacco: Never  Substance and Sexual Activity   Alcohol use: Yes  Alcohol/week: 1.0 standard drink    Types: 1 Glasses of wine per week   Drug use: No   Sexual activity: Never  Other Topics Concern   Not on file  Social History Narrative   Not on file   Social Determinants of Health   Financial Resource Strain: Not on file  Food Insecurity: Not on file  Transportation Needs: Not on file  Physical Activity: Not on file   Stress: Not on file  Social Connections: Not on file   Additional Social History:  Currently patient lives at section 8 housing, alone.  Stated that he is on disabilities.   Sleep: Good  Appetite:  Good  Current Medications: Current Facility-Administered Medications  Medication Dose Route Frequency Provider Last Rate Last Admin   acetaminophen (TYLENOL) tablet 650 mg  650 mg Oral Q6H PRN White, Patrice L, NP       alum & mag hydroxide-simeth (MAALOX/MYLANTA) 200-200-20 MG/5ML suspension 30 mL  30 mL Oral Q4H PRN White, Patrice L, NP       benztropine (COGENTIN) tablet 0.5 mg  0.5 mg Oral BID Princess Bruins, DO       divalproex (DEPAKOTE ER) 24 hr tablet 1,000 mg  1,000 mg Oral QHS Nwoko, Agnes I, NP   1,000 mg at 03/26/21 2059   haloperidol (HALDOL) tablet 5 mg  5 mg Oral BID Karsten Ro, MD   5 mg at 03/27/21 0815   lisinopril (ZESTRIL) tablet 20 mg  20 mg Oral Daily White, Patrice L, NP   20 mg at 03/27/21 0569   And   hydrochlorothiazide (MICROZIDE) capsule 12.5 mg  12.5 mg Oral Daily White, Patrice L, NP   12.5 mg at 03/27/21 0815   risperiDONE (RISPERDAL M-TABS) disintegrating tablet 2 mg  2 mg Oral Q8H PRN Comer Locket, MD       And   LORazepam (ATIVAN) tablet 1 mg  1 mg Oral PRN Comer Locket, MD       And   ziprasidone (GEODON) injection 20 mg  20 mg Intramuscular PRN Mason Jim, Shaman Muscarella E, MD       magnesium hydroxide (MILK OF MAGNESIA) suspension 30 mL  30 mL Oral Daily PRN White, Patrice L, NP        Lab Results:  No results found for this or any previous visit (from the past 48 hour(s)).    Blood Alcohol level:  Lab Results  Component Value Date   ETH <10 03/14/2021   ETH <5 01/18/2015    Metabolic Disorder Labs: Lab Results  Component Value Date   HGBA1C 5.5 03/17/2021   MPG 111 03/17/2021   MPG 114 01/20/2015   Lab Results  Component Value Date   PROLACTIN 33.3 (H) 03/17/2021   PROLACTIN 26.5 (H) 01/20/2015   Lab Results  Component Value Date    CHOL 182 03/17/2021   TRIG 118 03/17/2021   HDL 56 03/17/2021   CHOLHDL 3.3 03/17/2021   VLDL 24 03/17/2021   LDLCALC 102 (H) 03/17/2021   LDLCALC 115 (H) 12/24/2017    Physical Findings: AIMS: Facial and Oral Movements Muscles of Facial Expression: None, normal Lips and Perioral Area: None, normal Jaw: None, normal Tongue: None, normal,Extremity Movements Upper (arms, wrists, hands, fingers): None, normal Lower (legs, knees, ankles, toes): None, normal, Trunk Movements Neck, shoulders, hips: None, normal, Overall Severity Severity of abnormal movements (highest score from questions above): None, normal Incapacitation due to abnormal movements: None, normal Patient's awareness of abnormal movements (rate only  patient's report): No Awareness, Dental Status Current problems with teeth and/or dentures?: No Does patient usually wear dentures?: No     Musculoskeletal: Strength & Muscle Tone: within normal limits Gait & Station: normal, steady Patient leans: N/A  Psychiatric Specialty Exam: Physical Exam Vitals and nursing note reviewed.  HENT:     Head: Normocephalic and atraumatic.  Pulmonary:     Effort: Pulmonary effort is normal.  Neurological:     General: No focal deficit present.     Mental Status: He is alert and oriented to person, place, and time.    Review of Systems  Respiratory:  Negative for shortness of breath.   Cardiovascular:  Negative for chest pain.  Gastrointestinal:  Negative for abdominal pain, constipation, diarrhea and nausea.  Neurological:  Negative for dizziness and headaches.   Blood pressure 130/88, pulse 69, temperature 97.8 F (36.6 C), temperature source Oral, resp. rate 18, height 5' 8.11" (1.73 m), weight 81.6 kg, SpO2 100 %.Body mass index is 27.28 kg/m.  General Appearance: Casual and Fairly Groomed  Eye Contact:  Good  Speech:  Less rambling, spontaneous, less rapid, coherent  Volume:  Normal  Mood:  Mildly elevated  Affect:  Congruent with periods of inappropriate laughter  Thought Process: Superficially goal directed with periods of tangential thinking and flight of ideas  Orientation:  Full (Time, Place, and Person)  Thought Content: Patient denied SI/HI/AVH, first rank symptoms, delusions, paranoia. No delusions noted not grossly responding to internal/external stimuli on exam but has some flight of ideas.  Suicidal Thoughts:  No  Homicidal Thoughts:  No  Memory: Fair  Judgement:  Fair - compliant with medications  Insight:  Lacking  Psychomotor Activity: Normal, no restlessness, no tremors, no muscle stiffness, no involuntary movements  Concentration:  Concentration: Fair and Attention Span: Fair  Recall:  [employer] of Knowledge:  Fair  Language:  Fair  Akathisia:  No  Assets:  Communication Skills Desire for Improvement Social Support  ADL's:  Intact  Cognition:  WNL  Sleep:  Number of Hours: 8   Treatment Plan Summary: Daily contact with patient to assess and evaluate symptoms and progress in treatment and Medication management  Steven Clayton is a 40 y.o. male, with reported past psychiatric history of schizophrenia versus schizoaffective disorder - bipolar disorder type, and cocaine use. Patient presents involuntarily (IVC by mom) to [facility] (03/17/2021), transferred from [facility] for mania and aggression towards [place]. [facility] stay day 10.   Diagnoses: - Schizoaffective disorder - bipolar type - Primary hypertension - Elevated creatinine-RESOLVED  PSYCHIATRIC DIAGNOSIS & TREATMENT:  Schizoaffective disorder - bipolar type -Continue 5 mg Haldol PO twice daily  EKG scheduled for 8/8 in the morning. On 2 antipsychotics -Invega Sustenna 234 mg IM (03/21/2021), Hinda Glatter Sustenna 156 mg IM (03/25/2021) -Continue Depakote ER 1000 mg p.o. nightly (7/31) VPA level 70 (8/3) CBC:Hb 11.9/36.8, WBC 7.2 and platelets 230 (03/23/21) CMP: AST 26, ALT 22 within normal limits (8/3) -Increase  Cogentin 0.5 mg p.o. nightly to twice daily while on 2 antipsychotics  Labs:             EKG: QTc 385 (7/25); QTc 399 (03/23/21)             TSH 1.898 (7/28)             Lipid panel: LDL 102, otherwise within normal limits (7/28)             VPA level < 10 (7/26)  UA straw-colored (7/26)             UDS positive for cocaine and THC (7/26)             BAL <10 (7/25)             CBC within normal limits (7/25)             CMP: Cr 1.31, otherwise within normal limits (7/25) Cr 0.87 (03/23/21)             PRL 33.3 (7/30)             A1c 5.5 (7/30)  HIV nonreactive (7/31)  RPR nonreactive (7/31)  GC/Chlamydia negative (7/30)  Justification for Use of 2 antipsychotics: According to notes, patient has tried and failed previous trials of Invega sustenna/trinza and Risperdal po and consta, and has been given Zyprexa previously.    MEDICAL MANAGEMENT: Primary hypertension -Lisinopril 20 mg p.o. daily -HCTZ 12.5 mg p.o. daily   Elevated creatinine (1.31)-RESOLVED CMP: Cr 0.87 (8/3) -Continue to encourage patient to continue p.o. fluid intake  PRN's -Trazodone 50mg  PO qHS PRN for insomnia -Hydroxyzine 25mg  PO TID PRN for anxiety -Alum & mag hydroxide-simeth 30ml PO qHS PRN for GERD -Magnesium hydroxide 30ml PO daily PRN for constipation -Acetaminophen tablet 650mg  PO PRN q6hrs for mild pain   Discharge Planning: -Social work and case management to assist with discharge planning and identification of hospital follow-up needs prior to discharge- inquiring if he qualifies for ACTT referral -Estimated LOS: 3-4 days -Discharge Concerns: Need to establish a safety plan; Medication compliance and effectiveness -Discharge Goals: Return home with outpatient referrals for mental health follow-up including medication management/psychotherapy  Safety and Monitoring: -Involuntary admission to inpatient psychiatric unit for safety, stabilization and treatment -Daily contact with patient to  assess and evaluate symptoms and progress in treatment -Patient's case to be discussed in multi-disciplinary team meeting -Observation Level : q15 minute checks -Vital signs: q12 hours -Precautions: suicide, elopement, and assault  Princess BruinsJulie Nguyen, DO, PGY-2 03/27/2021, 11:51 AM

## 2021-03-28 DIAGNOSIS — F250 Schizoaffective disorder, bipolar type: Secondary | ICD-10-CM | POA: Diagnosis not present

## 2021-03-28 NOTE — [facility] Group Notes (Signed)
The focus of this group is to help patients establish daily goals to achieve during treatment and discuss how the patient can incorporate goal setting into their daily lives to aide in recovery.  Pt did not attend group 

## 2021-03-28 NOTE — Progress Notes (Signed)
Adult Psychoeducational Group Note  Date:  03/28/2021 Time:  10:38 PM  Group Topic/Focus:  Wrap-Up Group:   The focus of this group is to help patients review their daily goal of treatment and discuss progress on daily workbooks.  Participation Level:  Did Not Attend  Participation Quality:   Did Not Attend  Affect:   Did Not Attend  Cognitive:   Did Not Attend  Insight: None  Engagement in Group:   Did Not Attend  Modes of Intervention:   Did Not Attend  Additional Comments:  Pt did not attend evening wrap up group tonight.  Felipa Furnace 03/28/2021, 10:38 PM

## 2021-03-28 NOTE — [facility] Group Notes (Signed)
Due to the acuity on the unit group was not held. Patient was provided therapeutic worksheets and asked to meet with CSW as needed.   Adri Schloss, LCSW, LCAS Clincal Social Worker  [facility] 

## 2021-03-28 NOTE — Progress Notes (Signed)
[facility] MD Progress Note  03/28/2021 4:29 PM Steven Clayton  MRN:  [id number]  CC: Mania, aggression  Subjective:   Steven Clayton is a 40 y.o. male, with reported past psychiatric history of schizophrenia versus schizoaffective disorder - bipolar disorder type, and cocaine use. Patient presents involuntarily (IVC by mom) to [facility] (03/17/2021), transferred from [facility] for mania and aggression towards Steven Clayton. [facility] stay day 11.   24hr events:  The patient's chart was reviewed and nursing notes were reviewed. The patient's case was discussed in multidisciplinary team meeting.  Per MAR: - Patient is compliant with scheduled meds.  - PRNs: None required for agitation Per RN notes, patient has attended groups and no behavioral issues. Patient slept Number of Hours: 6.75.   Today (03/28/2021): The patient was seen and evaluated on the unit.  Pt states his mood, appetite, sleep are "good". Patient reported that he was able to get in contact with his mom, and that he is working on filling out paperwork for a new apartment. Pt denies any headache, nausea, vomiting, dizziness, chest pain, SOB, abdominal pain, diarrhea, constipation, muscle stiffness, involuntary movements. Pt denies any medication side effects and has been tolerating it well. Pt denies any concerns.   Patient denied SI/HI/AVH, delusions, paranoia, first rank symptoms.   Principal Problem: Schizoaffective disorder, bipolar type (HCC) Diagnosis: Principal Problem:   Schizoaffective disorder, bipolar type (HCC) Active Problems:   Tobacco use disorder, moderate, dependence   Essential hypertension  Total Time spent with patient: 30 minutes  Past Psychiatric History:  Previous psych diagnosis: Schizophrenia versus schizoaffective disorder - bipolar disorder type Current psych meds: Depakote as well as the every 3 month injection of paliperidone Past suicide attempts: Cut wrists (cut wrists) Past homicidal  behavior: None Past nonsuicidal self-harm: None Psych hospitalizations: [facility] (2003), [facility] (2004), [facility] (2006) x2, [facility] (2016)  [facility] Past psych med trials: Depakote, Risperdal, Abilify  Past Medical History:  Past Medical History:  Diagnosis Date   Anxiety    Depression    Schizoaffective disorder (HCC)    Stroke (HCC)    No past surgical history on file. Family History:  Family History  Problem Relation Age of Onset   Heart disease Mother    Hyperlipidemia Mother    Mental illness Father    Family Psychiatric  History: see H&P   Social History:  Social History   Substance and Sexual Activity  Alcohol Use Yes   Alcohol/week: 1.0 standard drink   Types: 1 Glasses of wine per week     Social History   Substance and Sexual Activity  Drug Use No    Social History   Socioeconomic History   Marital status: Single    Spouse name: Not on file   Number of children: Not on file   Years of education: Not on file   Highest education level: Not on file  Occupational History   Not on file  Tobacco Use   Smoking status: Some Days    Packs/day: 2.00    Types: Cigarettes   Smokeless tobacco: Never  Substance and Sexual Activity   Alcohol use: Yes    Alcohol/week: 1.0 standard drink    Types: 1 Glasses of wine per week   Drug use: No   Sexual activity: Never  Other Topics Concern   Not on file  Social History Narrative   Not on file   Social Determinants of Health  Financial Resource Strain: Not on file  Food Insecurity: Not on file  Transportation Needs: Not on file  Physical Activity: Not on file  Stress: Not on file  Social Connections: Not on file   Additional Social History:  Currently patient lives at section 8 housing, alone.  Stated that he is on disabilities.   Sleep: Good  Appetite:  Good  Current Medications: Current Facility-Administered Medications  Medication Dose  Route Frequency Provider Last Rate Last Admin   acetaminophen (TYLENOL) tablet 650 mg  650 mg Oral Q6H PRN White, Patrice L, NP       alum & mag hydroxide-simeth (MAALOX/MYLANTA) 200-200-20 MG/5ML suspension 30 mL  30 mL Oral Q4H PRN White, Patrice L, NP       benztropine (COGENTIN) tablet 0.5 mg  0.5 mg Oral BID Princess Bruins, DO   0.5 mg at 03/28/21 0957   divalproex (DEPAKOTE ER) 24 hr tablet 1,000 mg  1,000 mg Oral QHS Armandina Stammer I, NP   1,000 mg at 03/27/21 2107   haloperidol (HALDOL) tablet 5 mg  5 mg Oral BID Karsten Ro, MD   5 mg at 03/28/21 0957   lisinopril (ZESTRIL) tablet 20 mg  20 mg Oral Daily White, Patrice L, NP   20 mg at 03/28/21 8891   And   hydrochlorothiazide (MICROZIDE) capsule 12.5 mg  12.5 mg Oral Daily White, Patrice L, NP   12.5 mg at 03/28/21 0957   risperiDONE (RISPERDAL M-TABS) disintegrating tablet 2 mg  2 mg Oral Q8H PRN Comer Locket, MD       And   LORazepam (ATIVAN) tablet 1 mg  1 mg Oral PRN Comer Locket, MD       And   ziprasidone (GEODON) injection 20 mg  20 mg Intramuscular PRN Mason Jim, Amy E, MD       magnesium hydroxide (MILK OF MAGNESIA) suspension 30 mL  30 mL Oral Daily PRN White, Patrice L, NP        Lab Results:  No results found for this or any previous visit (from the past 48 hour(s)).    Blood Alcohol level:  Lab Results  Component Value Date   ETH <10 03/14/2021   ETH <5 01/18/2015    Metabolic Disorder Labs: Lab Results  Component Value Date   HGBA1C 5.5 03/17/2021   MPG 111 03/17/2021   MPG 114 01/20/2015   Lab Results  Component Value Date   PROLACTIN 33.3 (H) 03/17/2021   PROLACTIN 26.5 (H) 01/20/2015   Lab Results  Component Value Date   CHOL 182 03/17/2021   TRIG 118 03/17/2021   HDL 56 03/17/2021   CHOLHDL 3.3 03/17/2021   VLDL 24 03/17/2021   LDLCALC 102 (H) 03/17/2021   LDLCALC 115 (H) 12/24/2017    Physical Findings: AIMS: Facial and Oral Movements Muscles of Facial Expression: None,  normal Lips and Perioral Area: None, normal Jaw: None, normal Tongue: None, normal,Extremity Movements Upper (arms, wrists, hands, fingers): None, normal Lower (legs, knees, ankles, toes): None, normal, Trunk Movements Neck, shoulders, hips: None, normal, Overall Severity Severity of abnormal movements (highest score from questions above): None, normal Incapacitation due to abnormal movements: None, normal Patient's awareness of abnormal movements (rate only patient's report): No Awareness, Dental Status Current problems with teeth and/or dentures?: No Does patient usually wear dentures?: No     Musculoskeletal: Strength & Muscle Tone: within normal limits Gait & Station: normal, steady Patient leans: N/A  Psychiatric Specialty Exam: Physical Exam Vitals and  nursing note reviewed.  HENT:     Head: Normocephalic and atraumatic.  Pulmonary:     Effort: Pulmonary effort is normal.  Neurological:     General: No focal deficit present.     Mental Status: He is alert and oriented to person, place, and time.    Review of Systems  Respiratory:  Negative for shortness of breath.   Cardiovascular:  Negative for chest pain.  Gastrointestinal:  Negative for abdominal pain, constipation, diarrhea and nausea.  Neurological:  Negative for dizziness and headaches.   Blood pressure 106/88, pulse 72, temperature 98.4 F (36.9 C), temperature source Oral, resp. rate 18, height 5' 8.11" (1.73 m), weight 81.6 kg, SpO2 100 %.Body mass index is 27.28 kg/m.  General Appearance: Casual and Fairly Groomed  Eye Contact:  Good  Speech: Clear and coherent, less rambling, spontaneous, normal speed  Volume:  Normal  Mood:  Euthymic  Affect: Congruent with periods of inappropriate laughter that has decreased since yesterday  Thought Process: Goal directed with periods of tangential thinking.   Orientation:  Full (Time, Place, and Person)  Thought Content: Patient denied SI/HI/AVH, first rank symptoms,  delusions, paranoia. No delusions noted not grossly responding to internal/external stimuli on exam but has some flight of ideas.  Suicidal Thoughts:  No  Homicidal Thoughts:  No  Memory: Fair  Judgement:  Fair - compliant with medications  Insight:  Lacking  Psychomotor Activity: Normal, no restlessness, no tremors, no muscle stiffness, no involuntary movements  Concentration:  Concentration: Fair and Attention Span: Fair  Recall:  [employer] of Knowledge:  Fair  Language:  Fair  Akathisia:  No  Assets:  Communication Skills Desire for Improvement Social Support  ADL's:  Intact  Cognition:  WNL  Sleep:  Number of Hours: 6.75   Treatment Plan Summary: Daily contact with patient to assess and evaluate symptoms and progress in treatment and Medication management  Jaime Grizzell is a 40 y.o. male, with reported past psychiatric history of schizophrenia versus schizoaffective disorder - bipolar disorder type, and cocaine use. Patient presents involuntarily (IVC by mom) to [facility] (03/17/2021), transferred from [facility] for mania and aggression towards [place]. [facility] stay day 11.   Diagnoses: - Schizoaffective disorder - bipolar type - Primary hypertension - Elevated creatinine-RESOLVED  PSYCHIATRIC DIAGNOSIS & TREATMENT:  Schizoaffective disorder - bipolar type -Continue 5 mg Haldol PO twice daily  -Invega Sustenna 234 mg IM (03/21/2021), Invega Sustenna 156 mg IM (03/25/2021) EKG: QTc 397 (8/8) -Continue Depakote ER 1000 mg p.o. nightly (7/31) VPA level 70 (8/3) CBC:Hb 11.9/36.8, WBC 7.2 and platelets 230 (03/23/21) CMP: AST 26, ALT 22 within normal limits (8/3) -Continue Cogentin 0.5 mg p.o. nightly to twice daily while on 2 antipsychotics  Labs:             EKG: QTc 385 (7/25); QTc 399 (03/23/21)             TSH 1.898 (7/28)             Lipid panel: LDL 102, otherwise within normal limits (7/28)             VPA level < 10 (7/26)             UA straw-colored  (7/26)             UDS positive for cocaine and THC (7/26)             BAL <10 (7/25)  CBC within normal limits (7/25)             CMP: Cr 1.31, otherwise within normal limits (7/25) Cr 0.87 (03/23/21)             PRL 33.3 (7/30)             A1c 5.5 (7/30)  HIV nonreactive (7/31)  RPR nonreactive (7/31)  GC/Chlamydia negative (7/30)  Justification for Use of 2 antipsychotics: According to notes, patient has tried and failed previous trials of Invega sustenna/trinza and Risperdal po and consta, and has been given Zyprexa previously.    MEDICAL MANAGEMENT: Primary hypertension -Lisinopril 20 mg p.o. daily -HCTZ 12.5 mg p.o. daily   Elevated creatinine (1.31)-RESOLVED CMP: Cr 0.87 (8/3) -Continue to encourage patient to continue p.o. fluid intake  PRN's -Trazodone 50mg  PO qHS PRN for insomnia -Hydroxyzine 25mg  PO TID PRN for anxiety -Alum & mag hydroxide-simeth 30ml PO qHS PRN for GERD -Magnesium hydroxide 30ml PO daily PRN for constipation -Acetaminophen tablet 650mg  PO PRN q6hrs for mild pain   Discharge Planning: -Social work and case management to assist with discharge planning and identification of hospital follow-up needs prior to discharge- inquiring if he qualifies for ACTT referral -Estimated LOS: 3-4 days -Discharge Concerns: Need to establish a safety plan; Medication compliance and effectiveness -Discharge Goals: Return home with outpatient referrals for mental health follow-up including medication management/psychotherapy  Safety and Monitoring: -Involuntary admission to inpatient psychiatric unit for safety, stabilization and treatment -Daily contact with patient to assess and evaluate symptoms and progress in treatment -Patient's case to be discussed in multi-disciplinary team meeting -Observation Level : q15 minute checks -Vital signs: q12 hours -Precautions: suicide, elopement, and assault  Princess BruinsJulie Jahnyla Parrillo, DO, PGY-1 03/28/2021, 4:29 PM

## 2021-03-28 NOTE — Tx Team (Signed)
Interdisciplinary Treatment and Diagnostic Plan Update  03/28/2021 Time of Session: 9:40am Steven Clayton MRN: [id number]  Principal Diagnosis: Schizoaffective disorder, bipolar type (HCC)  Secondary Diagnoses: Principal Problem:   Schizoaffective disorder, bipolar type (HCC) Active Problems:   Tobacco use disorder, moderate, dependence   Essential hypertension   Current Medications:  Current Facility-Administered Medications  Medication Dose Route Frequency Provider Last Rate Last Admin   acetaminophen (TYLENOL) tablet 650 mg  650 mg Oral Q6H PRN White, Patrice L, NP       alum & mag hydroxide-simeth (MAALOX/MYLANTA) 200-200-20 MG/5ML suspension 30 mL  30 mL Oral Q4H PRN White, Patrice L, NP       benztropine (COGENTIN) tablet 0.5 mg  0.5 mg Oral BID Princess Bruins, DO   0.5 mg at 03/28/21 0957   divalproex (DEPAKOTE ER) 24 hr tablet 1,000 mg  1,000 mg Oral QHS Nwoko, Nicole Kindred I, NP   1,000 mg at 03/27/21 2107   haloperidol (HALDOL) tablet 5 mg  5 mg Oral BID Karsten Ro, MD   5 mg at 03/28/21 0957   lisinopril (ZESTRIL) tablet 20 mg  20 mg Oral Daily White, Patrice L, NP   20 mg at 03/28/21 6433   And   hydrochlorothiazide (MICROZIDE) capsule 12.5 mg  12.5 mg Oral Daily White, Patrice L, NP   12.5 mg at 03/28/21 0957   risperiDONE (RISPERDAL M-TABS) disintegrating tablet 2 mg  2 mg Oral Q8H PRN Comer Locket, MD       And   LORazepam (ATIVAN) tablet 1 mg  1 mg Oral PRN Comer Locket, MD       And   ziprasidone (GEODON) injection 20 mg  20 mg Intramuscular PRN Mason Jim, Amy E, MD       magnesium hydroxide (MILK OF MAGNESIA) suspension 30 mL  30 mL Oral Daily PRN White, Patrice L, NP       PTA Medications: No medications prior to admission.    Patient Stressors: Marital or family conflict  Patient Strengths: Active sense of humor  Treatment Modalities: Medication Management, Group therapy, Case management,  1 to 1 session with clinician, Psychoeducation, Recreational  therapy.   Physician Treatment Plan for Primary Diagnosis: Schizoaffective disorder, bipolar type (HCC) Long Term Goal(s): Improvement in symptoms so as ready for discharge   Short Term Goals: Ability to identify changes in lifestyle to reduce recurrence of condition will improve Ability to verbalize feelings will improve Ability to demonstrate self-control will improve Ability to identify and develop effective coping behaviors will improve Compliance with prescribed medications will improve Ability to identify triggers associated with substance abuse/mental health issues will improve  Medication Management: Evaluate patient's response, side effects, and tolerance of medication regimen.  Therapeutic Interventions: 1 to 1 sessions, Unit Group sessions and Medication administration.  Evaluation of Outcomes: Progressing  Physician Treatment Plan for Secondary Diagnosis: Principal Problem:   Schizoaffective disorder, bipolar type (HCC) Active Problems:   Tobacco use disorder, moderate, dependence   Essential hypertension  Long Term Goal(s): Improvement in symptoms so as ready for discharge   Short Term Goals: Ability to identify changes in lifestyle to reduce recurrence of condition will improve Ability to verbalize feelings will improve Ability to demonstrate self-control will improve Ability to identify and develop effective coping behaviors will improve Compliance with prescribed medications will improve Ability to identify triggers associated with substance abuse/mental health issues will improve     Medication Management: Evaluate patient's response, side effects, and tolerance of medication regimen.  Therapeutic Interventions: 1 to 1 sessions, Unit Group sessions and Medication administration.  Evaluation of Outcomes: Progressing   RN Treatment Plan for Primary Diagnosis: Schizoaffective disorder, bipolar type (HCC) Long Term Goal(s): Knowledge of disease and therapeutic  regimen to maintain health will improve  Short Term Goals: Ability to demonstrate self-control, Ability to verbalize feelings will improve, and Ability to identify and develop effective coping behaviors will improve  Medication Management: RN will administer medications as ordered by provider, will assess and evaluate patient's response and provide education to patient for prescribed medication. RN will report any adverse and/or side effects to prescribing provider.  Therapeutic Interventions: 1 on 1 counseling sessions, Psychoeducation, Medication administration, Evaluate responses to treatment, Monitor vital signs and CBGs as ordered, Perform/monitor CIWA, COWS, AIMS and Fall Risk screenings as ordered, Perform wound care treatments as ordered.  Evaluation of Outcomes: Progressing   LCSW Treatment Plan for Primary Diagnosis: Schizoaffective disorder, bipolar type (HCC) Long Term Goal(s): Safe transition to appropriate next level of care at discharge, Engage patient in therapeutic group addressing interpersonal concerns.  Short Term Goals: Engage patient in aftercare planning with referrals and resources, Increase ability to appropriately verbalize feelings, and Facilitate acceptance of mental health diagnosis and concerns  Therapeutic Interventions: Assess for all discharge needs, 1 to 1 time with Social worker, Explore available resources and support systems, Assess for adequacy in community support network, Educate family and significant other(s) on suicide prevention, Complete Psychosocial Assessment, Interpersonal group therapy.  Evaluation of Outcomes: Progressing   Progress in Treatment: Attending groups: Yes. Participating in groups: Yes. Taking medication as prescribed: Yes. Toleration medication: Yes. Family/Significant other contact made: Yes, individual(s) contacted:  mother Patient understands diagnosis: No. Discussing patient identified problems/goals with staff:  Yes. Medical problems stabilized or resolved: Yes. Denies suicidal/homicidal ideation: Yes. Issues/concerns per patient self-inventory: No. Other: None  New problem(s) identified: No, Describe:  None  New Short Term/Long Term Goal(s):medication stabilization, elimination of SI thoughts, development of comprehensive mental wellness plan.   Patient Goals:  "make sure I get out safe."  Discharge Plan or Barriers: Patient recently admitted. CSW will continue to follow and assess for appropriate referrals and possible discharge planning.   Reason for Continuation of Hospitalization: Aggression Mania Medication stabilization  Estimated Length of Stay: 3-5 days  Attendees: Patient:  03/23/2021   Physician:  03/23/2021   Nursing:  03/23/2021   RN Care Manager: 03/23/2021   Social Worker: Ruthann Cancer, LCSW 03/23/2021   Recreational Therapist:  03/23/2021   Other:  03/23/2021   Other:  03/23/2021   Other: 03/23/2021     Scribe for Treatment Team: Otelia Santee, LCSW 03/28/2021 11:26 AM

## 2021-03-28 NOTE — Progress Notes (Signed)
   03/27/21 2110  Psych Admission Type (Psych Patients Only)  Admission Status Involuntary  Psychosocial Assessment  Eye Contact Fair  Facial Expression Animated  Affect Appropriate to circumstance  Speech Logical/coherent  Interaction Assertive  Motor Activity Other (Comment) (WNL)  Appearance/Hygiene Unremarkable  Behavior Characteristics Cooperative  Mood Pleasant;Preoccupied  Aggressive Behavior  Effect No apparent injury  Thought Chartered certified accountant of ideas;Tangential  Content WDL  Delusions None reported or observed  Perception WDL  Hallucination None reported or observed  Judgment Poor  Confusion None  Danger to Self  Current suicidal ideation? Denies  Danger to Others  Danger to Others None reported or observed

## 2021-03-28 NOTE — Progress Notes (Addendum)
   03/28/21 2100  Psych Admission Type (Psych Patients Only)  Admission Status Involuntary  Psychosocial Assessment  Patient Complaints None  Eye Contact Fair  Facial Expression Animated  Affect Appropriate to circumstance  Speech Logical/coherent  Interaction Assertive  Motor Activity Other (Comment) (wnl)  Appearance/Hygiene Unremarkable  Behavior Characteristics Cooperative  Mood Pleasant  Thought Process  Coherency WDL  Content WDL  Delusions None reported or observed  Hallucination None reported or observed  Judgment Poor  Confusion None  Danger to Self  Current suicidal ideation? Denies  Danger to Others  Danger to Others None reported or observed   Pt denies SI, HI, AVH and pain. Denies anxiety and depression as well. Pt says he feels good. Says that social work is still working on his housing situation and will let him know any news tomorrow.

## 2021-03-29 LAB — COMPREHENSIVE METABOLIC PANEL
ALT: 45 U/L — ABNORMAL HIGH (ref 0–44)
AST: 31 U/L (ref 15–41)
Albumin: 3.6 g/dL (ref 3.5–5.0)
Alkaline Phosphatase: 86 U/L (ref 38–126)
Anion gap: 10 (ref 5–15)
BUN: 18 mg/dL (ref 6–20)
CO2: 28 mmol/L (ref 22–32)
Calcium: 9.3 mg/dL (ref 8.9–10.3)
Chloride: 101 mmol/L (ref 98–111)
Creatinine, Ser: 1.12 mg/dL (ref 0.61–1.24)
GFR, Estimated: >60 mL/min (ref >60)
Glucose, Bld: 102 mg/dL — ABNORMAL HIGH (ref 70–99)
Potassium: 4.2 mmol/L (ref 3.5–5.1)
Sodium: 139 mmol/L (ref 135–145)
Total Bilirubin: 0.2 mg/dL — ABNORMAL LOW (ref 0.3–1.2)
Total Protein: 6.3 g/dL — ABNORMAL LOW (ref 6.5–8.1)

## 2021-03-29 LAB — CBC WITH DIFFERENTIAL/PLATELET
Abs Immature Granulocytes: 0.03 10*3/uL (ref 0.00–0.07)
Basophils Absolute: 0 10*3/uL (ref 0.0–0.1)
Basophils Relative: 1 %
Eosinophils Absolute: 0.4 10*3/uL (ref 0.0–0.5)
Eosinophils Relative: 5 %
HCT: 34.5 % — ABNORMAL LOW (ref 39.0–52.0)
Hemoglobin: 11.5 g/dL — ABNORMAL LOW (ref 13.0–17.0)
Immature Granulocytes: 0 %
Lymphocytes Relative: 39 %
Lymphs Abs: 3.1 10*3/uL (ref 0.7–4.0)
MCH: 30.7 pg (ref 26.0–34.0)
MCHC: 33.3 g/dL (ref 30.0–36.0)
MCV: 92 fL (ref 80.0–100.0)
Monocytes Absolute: 0.8 10*3/uL (ref 0.1–1.0)
Monocytes Relative: 10 %
Neutro Abs: 3.7 10*3/uL (ref 1.7–7.7)
Neutrophils Relative %: 45 %
Platelets: 219 10*3/uL (ref 150–400)
RBC: 3.75 MIL/uL — ABNORMAL LOW (ref 4.22–5.81)
RDW: 14.3 % (ref 11.5–15.5)
WBC: 8.1 10*3/uL (ref 4.0–10.5)
nRBC: 0 % (ref 0.0–0.2)

## 2021-03-29 LAB — RESP PANEL BY RT-PCR (FLU A&B, COVID) ARPGX2
Influenza A by PCR: NEGATIVE
Influenza B by PCR: NEGATIVE
SARS Coronavirus 2 by RT PCR: NEGATIVE
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
## 2021-03-29 NOTE — Progress Notes (Signed)
   03/29/21 0616  Vital Signs  Pulse Rate 75  BP 108/79  BP Location Right Arm  BP Method Automatic  Patient Position (if appropriate) Standing   D:  Patient denies SI/HI/AVH. Patient denies anxiety and depression. Pt. Isolated in roo.  A:  Patient took scheduled medicine.  Support and encouragement provided Routine safety checks conducted every 15 minutes. Patient  Informed to notify staff with any concerns.   R:  Safety maintained.

## 2021-03-29 NOTE — Progress Notes (Signed)
Adult Psychoeducational Group Note  Date:  03/29/2021 Time:  9:16 PM  Group Topic/Focus:  Wrap-Up Group:   The focus of this group is to help patients review their daily goal of treatment and discuss progress on daily workbooks.  Participation Level:  Active  Participation Quality:  Appropriate  Affect:  Appropriate  Cognitive:  Appropriate  Insight: Appropriate  Engagement in Group:  Developing/Improving  Modes of Intervention:  Discussion  Additional Comments:  Pt stated his goal for today was to focus on his treatment plan. Pt stated he accomplished his goal today. Pt he talked with his doctor and his social worker about his care today. Pt rated his overall day a  8 out of 10. Pt stated he was able to contact his mother today which improve his overall day. Pt stated he felt better about himself today. Pt stated he was able to attend all meals. Pt stated he took all medications provided today. Pt stated he attend all groups held today. Pt stated his appetite was pretty good today. Pt rated sleep last night was pretty good. Pt stated the goal tonight was to get some rest. Pt stated he had no physical pain tonight.  Pt deny visual hallucinations and auditory issues tonight. Pt denies thoughts of harming himself or others. Pt stated he would alert staff if anything changed  Felipa Furnace 03/29/2021, 9:16 PM

## 2021-03-29 NOTE — Progress Notes (Addendum)
    03/29/21 2100  Psych Admission Type (Psych Patients Only)  Admission Status Involuntary  Psychosocial Assessment  Patient Complaints None  Eye Contact Fair  Facial Expression Animated  Affect Appropriate to circumstance  Speech Logical/coherent  Interaction Assertive  Motor Activity Other (Comment) (wnl)  Appearance/Hygiene Unremarkable  Behavior Characteristics Cooperative  Mood Pleasant  Thought Process  Coherency WDL  Content WDL  Delusions None reported or observed  Perception WDL  Hallucination None reported or observed  Judgment Poor  Confusion None  Danger to Self  Current suicidal ideation? Denies  Danger to Others  Danger to Others None reported or observed   Pt pleasant. Minimal, staying in his room a lot. Denies SI, HI, AVH and pain. Denies anxiety and depression. Says SW is still working on his housing needs. Pt calm and takes medications as prescribed.

## 2021-03-29 NOTE — Progress Notes (Signed)
Pt did not attend orientation/goals group. 

## 2021-03-30 NOTE — Progress Notes (Signed)
Pt did not attend orientation/goals group. 
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
## 2021-03-30 NOTE — Progress Notes (Signed)
D- Patient alert and oriented. Affect/mood calm and cooperative.  Denies SI, HI, AVH, and pain.   A- Scheduled medications administered to patient, per MD orders. Support and encouragement provided.  Routine safety checks conducted every 15 minutes.  Patient informed to notify staff with problems or concerns.  R- No adverse drug reactions noted. Patient contracts for safety at this time. Patient compliant with medications and treatment plan. Patient receptive, calm, and cooperative. Patient interacts well with others on the unit.  Patient remains safe at this time.    03/30/21 0951  Charting Type  Charting Type Shift assessment  Assessment of needs addressed Yes  Orders Chart Check (once per shift) Completed  Safety Check Verification  Has the RN verified the 15 minute safety check completion? Yes  Neurological  Neuro (WDL) WDL  HEENT  HEENT (WDL) WDL  Respiratory  Respiratory (WDL) WDL  Cardiac  Cardiac (WDL) WDL  Vascular  Vascular (WDL) WDL  Integumentary  Integumentary (WDL) WDL  Braden Scale (Ages 8 and up)  Sensory Perceptions 4  Moisture 4  Activity 4  Mobility 4  Nutrition 3  Friction and Shear 3  Braden Scale Score 22  Musculoskeletal  Musculoskeletal (WDL) WDL  Assistive Device None  Gastrointestinal  Gastrointestinal (WDL) WDL  GU Assessment  Genitourinary (WDL) WDL  Neurological  Level of Consciousness Alert

## 2021-03-30 NOTE — Plan of Care (Signed)
  Problem: Education: Goal: Emotional status will improve Outcome: Progressing Goal: Mental status will improve Outcome: Progressing   

## 2021-03-30 NOTE — [facility] Group Notes (Signed)
Due to COVID precautions on the unit, pts were given packet to complete individually.      Abubakr Wieman MSW, LCSW Clincal Social Worker [facility]  

## 2021-03-30 NOTE — Progress Notes (Signed)
[facility] Group Notes:  (Nursing/MHT/Case Management/Adjunct)  Date:  03/30/2021  Time:  2000  Type of Therapy:   wrap up group  Participation Level:  Active  Participation Quality:  Appropriate, Attentive, Sharing, and Supportive  Affect:  Appropriate  Cognitive:  Alert  Insight:  Improving  Engagement in Group:  Engaged  Modes of Intervention:  Clarification, Education, and Support  Summary of Progress/Problems: Positive thinking and positive change were discussed.   Marcille Buffy 03/30/2021, 10:22 PM

## 2021-03-31 ENCOUNTER — Encounter (HOSPITAL_COMMUNITY): Payer: Self-pay | Admitting: Registered Nurse

## 2021-03-31 MED ORDER — HALOPERIDOL 5 MG PO TABS
5.0000 mg | ORAL_TABLET | Freq: Every day | ORAL | Status: DC
  Administered 2021-03-31 – 2021-04-01 (×2): 5 mg via ORAL
  Filled 2021-03-31 (×4): qty 1

## 2021-03-31 MED ORDER — HALOPERIDOL 5 MG PO TABS
10.0000 mg | ORAL_TABLET | Freq: Every day | ORAL | Status: DC
  Administered 2021-03-31: 10 mg via ORAL
  Filled 2021-03-31 (×3): qty 2

## 2021-03-31 NOTE — Tx Team (Signed)
Interdisciplinary Treatment and Diagnostic Plan Update  03/31/2021 Time of Session: 9:40am Steven Clayton MRN: [id number]  Principal Diagnosis: Schizoaffective disorder, bipolar type (HCC)  Secondary Diagnoses: Principal Problem:   Schizoaffective disorder, bipolar type (HCC) Active Problems:   Tobacco use disorder, moderate, dependence   Essential hypertension   Current Medications:  Current Facility-Administered Medications  Medication Dose Route Frequency Provider Last Rate Last Admin   acetaminophen (TYLENOL) tablet 650 mg  650 mg Oral Q6H PRN White, Patrice L, NP       alum & mag hydroxide-simeth (MAALOX/MYLANTA) 200-200-20 MG/5ML suspension 30 mL  30 mL Oral Q4H PRN White, Patrice L, NP       benztropine (COGENTIN) tablet 0.5 mg  0.5 mg Oral BID Princess Bruins, DO   0.5 mg at 03/31/21 0923   divalproex (DEPAKOTE ER) 24 hr tablet 1,000 mg  1,000 mg Oral QHS Nwoko, Agnes I, NP   1,000 mg at 03/30/21 2057   haloperidol (HALDOL) tablet 5 mg  5 mg Oral Q0600 Princess Bruins, DO   5 mg at 03/31/21 5397   And   haloperidol (HALDOL) tablet 10 mg  10 mg Oral QHS Princess Bruins, DO       lisinopril (ZESTRIL) tablet 20 mg  20 mg Oral Daily White, Patrice L, NP   20 mg at 03/31/21 6734   And   hydrochlorothiazide (MICROZIDE) capsule 12.5 mg  12.5 mg Oral Daily White, Patrice L, NP   12.5 mg at 03/31/21 1937   risperiDONE (RISPERDAL M-TABS) disintegrating tablet 2 mg  2 mg Oral Q8H PRN Comer Locket, MD       And   LORazepam (ATIVAN) tablet 1 mg  1 mg Oral PRN Comer Locket, MD       And   ziprasidone (GEODON) injection 20 mg  20 mg Intramuscular PRN Mason Jim, Amy E, MD       magnesium hydroxide (MILK OF MAGNESIA) suspension 30 mL  30 mL Oral Daily PRN White, Patrice L, NP       PTA Medications: No medications prior to admission.    Patient Stressors: Marital or family conflict  Patient Strengths: Active sense of humor  Treatment Modalities: Medication Management, Group  therapy, Case management,  1 to 1 session with clinician, Psychoeducation, Recreational therapy.   Physician Treatment Plan for Primary Diagnosis: Schizoaffective disorder, bipolar type (HCC) Long Term Goal(s): Improvement in symptoms so as ready for discharge   Short Term Goals: Ability to identify changes in lifestyle to reduce recurrence of condition will improve Ability to verbalize feelings will improve Ability to demonstrate self-control will improve Ability to identify and develop effective coping behaviors will improve Compliance with prescribed medications will improve Ability to identify triggers associated with substance abuse/mental health issues will improve  Medication Management: Evaluate patient's response, side effects, and tolerance of medication regimen.  Therapeutic Interventions: 1 to 1 sessions, Unit Group sessions and Medication administration.  Evaluation of Outcomes: Adequate for Discharge  Physician Treatment Plan for Secondary Diagnosis: Principal Problem:   Schizoaffective disorder, bipolar type (HCC) Active Problems:   Tobacco use disorder, moderate, dependence   Essential hypertension  Long Term Goal(s): Improvement in symptoms so as ready for discharge   Short Term Goals: Ability to identify changes in lifestyle to reduce recurrence of condition will improve Ability to verbalize feelings will improve Ability to demonstrate self-control will improve Ability to identify and develop effective coping behaviors will improve Compliance with prescribed medications will improve Ability to identify triggers associated  with substance abuse/mental health issues will improve     Medication Management: Evaluate patient's response, side effects, and tolerance of medication regimen.  Therapeutic Interventions: 1 to 1 sessions, Unit Group sessions and Medication administration.  Evaluation of Outcomes: Adequate for Discharge   RN Treatment Plan for Primary  Diagnosis: Schizoaffective disorder, bipolar type (HCC) Long Term Goal(s): Knowledge of disease and therapeutic regimen to maintain health will improve  Short Term Goals: Ability to demonstrate self-control, Ability to verbalize feelings will improve, and Ability to identify and develop effective coping behaviors will improve  Medication Management: RN will administer medications as ordered by provider, will assess and evaluate patient's response and provide education to patient for prescribed medication. RN will report any adverse and/or side effects to prescribing provider.  Therapeutic Interventions: 1 on 1 counseling sessions, Psychoeducation, Medication administration, Evaluate responses to treatment, Monitor vital signs and CBGs as ordered, Perform/monitor CIWA, COWS, AIMS and Fall Risk screenings as ordered, Perform wound care treatments as ordered.  Evaluation of Outcomes: Adequate for Discharge   LCSW Treatment Plan for Primary Diagnosis: Schizoaffective disorder, bipolar type (HCC) Long Term Goal(s): Safe transition to appropriate next level of care at discharge, Engage patient in therapeutic group addressing interpersonal concerns.  Short Term Goals: Engage patient in aftercare planning with referrals and resources, Increase ability to appropriately verbalize feelings, and Facilitate acceptance of mental health diagnosis and concerns  Therapeutic Interventions: Assess for all discharge needs, 1 to 1 time with Social worker, Explore available resources and support systems, Assess for adequacy in community support network, Educate family and significant other(s) on suicide prevention, Complete Psychosocial Assessment, Interpersonal group therapy.  Evaluation of Outcomes: Adequate for Discharge   Progress in Treatment: Attending groups: Yes. Participating in groups: Yes. Taking medication as prescribed: Yes. Toleration medication: Yes. Family/Significant other contact made: Yes,  individual(s) contacted:  mother Patient understands diagnosis: No. Discussing patient identified problems/goals with staff: Yes. Medical problems stabilized or resolved: Yes. Denies suicidal/homicidal ideation: Yes. Issues/concerns per patient self-inventory: No. Other: None  New problem(s) identified: No, Describe:  None  New Short Term/Long Term Goal(s):medication stabilization, elimination of SI thoughts, development of comprehensive mental wellness plan.   Patient Goals:  "make sure I get out safe."  Discharge Plan or Barriers: Patient will discharge home with his mother until she is able to arrange new sections 8 housing for the pt. Pt has appointments for therapy and medication management post discharge   Reason for Continuation of Hospitalization: Aggression Mania Medication stabilization  Estimated Length of Stay: 3-5 days  Attendees: Patient: Steven Clayton 04/01/2021   Physician: Dr. Renaldo Fiddler 04/01/2021   Nursing:  04/01/2021   RN Care Manager: 04/01/2021   Social Worker: Ruthann Cancer, LCSW 04/01/2021   Recreational Therapist:  04/01/2021   Other:  04/01/2021   Other:  04/01/2021   Other: 04/01/2021     Scribe for Treatment Team: Felizardo Hoffmann, LCSWA 03/31/2021 2:16 PM
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
## 2021-03-31 NOTE — Progress Notes (Signed)
   03/30/21 2057  Psych Admission Type (Psych Patients Only)  Admission Status Involuntary  Psychosocial Assessment  Patient Complaints None  Eye Contact Fair  Facial Expression Animated  Affect Appropriate to circumstance  Speech Logical/coherent  Interaction Assertive  Motor Activity Other (Comment) (WDL)  Appearance/Hygiene Unremarkable  Behavior Characteristics Cooperative;Appropriate to situation  Mood Pleasant  Thought Process  Coherency WDL  Content WDL  Delusions None reported or observed  Perception WDL  Hallucination None reported or observed  Judgment Poor  Confusion None  Danger to Self  Current suicidal ideation? Denies  Danger to Others  Danger to Others None reported or observed

## 2021-03-31 NOTE — [facility] Group Notes (Signed)
[facility] Group Notes:  (Nursing/MHT/Case Management/Adjunct)  Date:  03/31/2021  Time:  11:22 AM  Type of Therapy:  Group Therapy  Participation Level:  Active  Participation Quality:  Appropriate  Affect:  Appropriate  Cognitive:  Alert and Appropriate  Insight:  Appropriate and Good  Engagement in Group:  Engaged  Modes of Intervention:  Orientation  Summary of Progress/Problems: Pt goal for today is to continue to be positive, go to groups and find out when he is going home.   Steven Clayton 03/31/2021, 11:22 AM

## 2021-03-31 NOTE — Progress Notes (Signed)
Adult Psychoeducational Group Note  Date:  03/31/2021 Time:  8:50 PM  Group Topic/Focus:  Wrap-Up Group:   The focus of this group is to help patients review their daily goal of treatment and discuss progress on daily workbooks.  Participation Level:  Active  Participation Quality:  Appropriate and Attentive  Affect:  Appropriate  Cognitive:  Alert and Appropriate  Insight: Appropriate and Good  Engagement in Group:  Engaged and Supportive  Modes of Intervention:  Discussion and Support  Additional Comments: Pt articulated that his goal for today was to focus on his treatment plan, and time management. This goal was accomplished. Pt stated he did talk with staff about his treatment. Pt conveyed he made a phone call to his mother and reported relationship with his family and support system was improving. Pt reported he took all medications provided and attended all meal services with 100% intake. Pt said his appetite was good today. Pt evaluated his sleep last night as good. Pt verbalized he felt good about himself and rated his overall day a 10 out of 10 on this date. Pt articulated he had no physical pain. Pt denies no auditory or visual hallucinations or thoughts of harming himself or others. Pt said he would alert staff if anything changed. End of Wrap-Up Group progress note.   Nicoletta Dress 03/31/2021, 8:50 PM
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
## 2021-03-31 NOTE — Progress Notes (Signed)
   03/31/21 2015  Psych Admission Type (Psych Patients Only)  Admission Status Involuntary  Psychosocial Assessment  Patient Complaints Depression  Eye Contact Fair  Facial Expression Animated  Affect Appropriate to circumstance  Speech Logical/coherent  Interaction Assertive  Motor Activity Other (Comment) (wnl)  Appearance/Hygiene Unremarkable  Behavior Characteristics Cooperative;Appropriate to situation  Mood Pleasant  Thought Process  Coherency WDL  Content WDL  Delusions None reported or observed  Perception WDL  Hallucination None reported or observed  Judgment WDL  Confusion None  Danger to Self  Current suicidal ideation? Denies  Danger to Others  Danger to Others None reported or observed   Pt seen in dayroom. Pt denies SI, HI, AVH and pain. Pt endorses depression 4/10. Pt says he will find a new place with Section 8 to live after discharge and will stay away from people who will cause him to relapse. Pt states he has no side effects from medications that will reduce compliance after discharge.

## 2021-04-01 MED ORDER — BENZTROPINE MESYLATE 0.5 MG PO TABS: 0.5000 mg | ORAL_TABLET | Freq: Two times a day (BID) | ORAL | 0 refills | Status: DC

## 2021-04-01 MED ORDER — DIVALPROEX SODIUM ER 500 MG PO TB24: 1000.0000 mg | ORAL_TABLET | Freq: Every day | ORAL | 0 refills | Status: DC

## 2021-04-01 MED ORDER — LISINOPRIL 20 MG PO TABS: 20.0000 mg | ORAL_TABLET | Freq: Every day | ORAL | 0 refills | Status: DC

## 2021-04-01 MED ORDER — HYDROCHLOROTHIAZIDE 12.5 MG PO CAPS: 12.5000 mg | ORAL_CAPSULE | Freq: Every day | ORAL | 0 refills | Status: DC

## 2021-04-01 MED ORDER — HALOPERIDOL 5 MG PO TABS: 5.0000 mg | ORAL_TABLET | Freq: Every day | ORAL | 0 refills | Status: DC

## 2021-04-01 MED ORDER — HALOPERIDOL 10 MG PO TABS: 10.0000 mg | ORAL_TABLET | Freq: Every day | ORAL | 0 refills | Status: DC

## 2021-04-01 NOTE — Progress Notes (Signed)
Was able to get in direct contact with patient's mother Danny Lawless Ph: [phone]) via phone.  Mom was irritated during the phone call, stated that she just spoke to social work. Mom immediately stated that patient is not ready and that the last time they spoke, patient was "rambly". Mom stated that the last time she spoke to patient was Tuesday (3 days prior).   However on per conversation with mom on 03/25/2021, "Ms. Talbert Forest stated that patient's baseline consisted of calm mood that is not agitated or stressed out.  Stated that at baseline, patient still exhibits disorganized speech that is normal rate and that he is able to answer questions."  On assessment this morning, patient's thought process was linear, coherent, goal directed. Speech was normal speech. Thought content, patient denied SI/HI/AVH, delusions, paranoia, first rank symptoms. Patient is not grossly responding to internal/external stimuli on exam and did not make delusional statements. Patient has improved remarkably since admission.

## 2021-04-01 NOTE — Progress Notes (Signed)
  [facility] Adult Case Management Discharge Plan :  Will you be returning to the same living situation after discharge:  No. Will be discharged with shelter resources At discharge, do you have transportation home?: Yes,  mother is to pick this pt up Do you have the ability to pay for your medications: Yes,  has insurance  Release of information consent forms completed and in the chart;  Patient's signature needed at discharge.  Patient to Follow up at:  Follow-up Information     [facility] Follow up.   Specialty: Behavioral Health Why: You may go to this provider for therapy and medication management services during the walk in hours:  Monday through Wednesday, from 8:00 am to 10:30 am.  Services are provided on a first come, first served basis. Contact information: [address] [place] [address] [phone]        [facility] Go on 04/01/2021.   Why: You have an assessment appointment for therapy services on 04/01/21 at 10:00 am.   This appointment will be held in person. Contact information: [address] [place], [place] [address]   Phone: [phone], intake        [facility] Follow up.   Why: A referral has been made on your behalf for ACTT services. Contact information: [address] [place] [address] [phone]                 Next level of care provider has access to [facility] Link:no  Safety Planning and Suicide Prevention discussed: Yes,  with mother     Has patient been referred to the Quitline?: Patient refused referral  Patient has been referred for addiction treatment: N/A  Otelia Santee, LCSW 04/01/2021, 10:16 AM

## 2021-04-01 NOTE — Progress Notes (Signed)
Pt discharged to lobby. Pt was stable and appreciative at that time. All papers and prescriptions were given and valuables returned. Verbal understanding expressed. Denies SI/HI and A/VH. Pt given opportunity to express concerns and ask questions.  

## 2021-04-01 NOTE — [facility] Suicide Risk Assessment (Signed)
[facility] Discharge Suicide Risk Assessment   Principal Problem: Schizoaffective disorder, bipolar type (HCC) Discharge Diagnoses: Principal Problem:   Schizoaffective disorder, bipolar type (HCC) Active Problems:   Tobacco use disorder, moderate, dependence   Essential hypertension   Total Time spent with patient: 15 minutes  Musculoskeletal: Strength & Muscle Tone: within normal limits Gait & Station: normal Patient leans: N/A  Psychiatric Specialty Exam  Presentation  General Appearance: Appropriate for Environment; Casual  Eye Contact:Good  Speech:Clear and Coherent  Speech Volume:Normal  Handedness:Right   Mood and Affect  Mood:Euthymic  Duration of Depression Symptoms: No data recorded Affect:Congruent   Thought Process  Thought Processes:Disorganized  Descriptions of Associations:Loose  Orientation:Full (Time, Place and Person)  Thought Content:Illogical; Tangential; Paranoid Ideation  History of Schizophrenia/Schizoaffective disorder:Yes  Duration of Psychotic Symptoms:Greater than six months  Hallucinations:No data recorded Ideas of Reference:None  Suicidal Thoughts:No data recorded Homicidal Thoughts:No data recorded  Sensorium  Memory:Immediate Good; Recent Fair; Remote Fair  Judgment:Impaired  Insight:Poor   Executive Functions  Concentration:Poor  Attention Span:Fair  Recall:Fair  Fund of Knowledge:Fair  Language:Fair   Psychomotor Activity  Psychomotor Activity: No data recorded  Assets  Assets:Communication Skills; Desire for Improvement   Sleep  Sleep: No data recorded  Physical Exam: Physical Exam Vitals and nursing note reviewed.  Constitutional:      Appearance: Normal appearance.  HENT:     Head: Normocephalic and atraumatic.  Pulmonary:     Effort: Pulmonary effort is normal.  Neurological:     General: No focal deficit present.     Mental Status: He is alert and oriented to person, place, and time.    Review of Systems  All other systems reviewed and are negative. Blood pressure 111/90, pulse 80, temperature 98 F (36.7 C), temperature source Oral, resp. rate 18, height 5' 8.11" (1.73 m), weight 81.6 kg, SpO2 99 %. Body mass index is 27.28 kg/m.  Mental Status Per Nursing Assessment::   On Admission:  Suicidal ideation indicated by patient  Demographic Factors:  Male, Gay, lesbian, or bisexual orientation, Low socioeconomic status, and Unemployed  Loss Factors: NA  Historical Factors: Impulsivity  Risk Reduction Factors:   Living with another person, especially a relative  Continued Clinical Symptoms:  Alcohol/Substance Abuse/Dependencies Schizophrenia:   Less than 28 years old Paranoid or undifferentiated type  Cognitive Features That Contribute To Risk:  None    Suicide Risk:  Minimal: No identifiable suicidal ideation.  Patients presenting with no risk factors but with morbid ruminations; may be classified as minimal risk based on the severity of the depressive symptoms   Follow-up Information     [facility] Follow up.   Specialty: Behavioral Health Why: You may go to this provider for therapy and medication management services during the walk in hours:  Monday through Wednesday, from 8:00 am to 10:30 am.  Services are provided on a first come, first served basis. Contact information: [address] [place] [address] [phone]        [facility] Go on 04/01/2021.   Why: You have an assessment appointment for therapy services on 04/01/21 at 10:00 am.   This appointment will be held in person. Contact information: [address] [place], [place] [address]   Phone: [phone], intake        [facility] Follow up.   Why: A referral has been made on your behalf for ACTT services. Contact information: [address] [place] [address]  [phone]                 Plan  Of Care/Follow-up recommendations:  Activity:  ad lib  Antonieta Pert, MD 04/01/2021, 9:14 AM

## 2021-05-10 ENCOUNTER — Ambulatory Visit (HOSPITAL_COMMUNITY)
Admission: EM | Admit: 2021-05-10 | Discharge: 2021-05-10 | Disposition: A | Discharge status: Home / Self Care | Payer: Medicare Other | Attending: Student | Admitting: Student

## 2021-05-10 DIAGNOSIS — Z79899 Other long term (current) drug therapy: Secondary | ICD-10-CM | POA: Insufficient documentation

## 2021-05-10 DIAGNOSIS — T464X6A Underdosing of angiotensin-converting-enzyme inhibitors, initial encounter: Secondary | ICD-10-CM | POA: Diagnosis not present

## 2021-05-10 DIAGNOSIS — T434X6A Underdosing of butyrophenone and thiothixene neuroleptics, initial encounter: Secondary | ICD-10-CM | POA: Diagnosis not present

## 2021-05-10 DIAGNOSIS — Z20822 Contact with and (suspected) exposure to covid-19: Secondary | ICD-10-CM | POA: Insufficient documentation

## 2021-05-10 DIAGNOSIS — R462 Strange and inexplicable behavior: Secondary | ICD-10-CM | POA: Diagnosis present

## 2021-05-10 DIAGNOSIS — F17210 Nicotine dependence, cigarettes, uncomplicated: Secondary | ICD-10-CM | POA: Insufficient documentation

## 2021-05-10 DIAGNOSIS — I10 Essential (primary) hypertension: Secondary | ICD-10-CM | POA: Diagnosis not present

## 2021-05-10 DIAGNOSIS — Z8673 Personal history of transient ischemic attack (TIA), and cerebral infarction without residual deficits: Secondary | ICD-10-CM | POA: Diagnosis not present

## 2021-05-10 DIAGNOSIS — T443X6A Underdosing of other parasympatholytics [anticholinergics and antimuscarinics] and spasmolytics, initial encounter: Secondary | ICD-10-CM | POA: Diagnosis not present

## 2021-05-10 DIAGNOSIS — F250 Schizoaffective disorder, bipolar type: Secondary | ICD-10-CM | POA: Diagnosis not present

## 2021-05-10 LAB — POCT URINE DRUG SCREEN - MANUAL ENTRY (I-SCREEN)
POC Amphetamine UR: NOT DETECTED
POC Buprenorphine (BUP): NOT DETECTED
POC Cocaine UR: POSITIVE — AB
POC Marijuana UR: POSITIVE — AB
POC Methadone UR: NOT DETECTED
POC Methamphetamine UR: NOT DETECTED
POC Morphine: NOT DETECTED
POC Oxazepam (BZO): NOT DETECTED
POC Oxycodone UR: NOT DETECTED
POC Secobarbital (BAR): NOT DETECTED

## 2021-05-10 LAB — COMPREHENSIVE METABOLIC PANEL
ALT: 14 U/L (ref 0–44)
AST: 23 U/L (ref 15–41)
Albumin: 3.9 g/dL (ref 3.5–5.0)
Alkaline Phosphatase: 60 U/L (ref 38–126)
Anion gap: 13 (ref 5–15)
BUN: 10 mg/dL (ref 6–20)
CO2: 25 mmol/L (ref 22–32)
Calcium: 9.4 mg/dL (ref 8.9–10.3)
Chloride: 98 mmol/L (ref 98–111)
Creatinine, Ser: 1.16 mg/dL (ref 0.61–1.24)
GFR, Estimated: >60 mL/min (ref >60)
Glucose, Bld: 100 mg/dL — ABNORMAL HIGH (ref 70–99)
Potassium: 3.3 mmol/L — ABNORMAL LOW (ref 3.5–5.1)
Sodium: 136 mmol/L (ref 135–145)
Total Bilirubin: 0.5 mg/dL (ref 0.3–1.2)
Total Protein: 6.9 g/dL (ref 6.5–8.1)

## 2021-05-10 LAB — CBC WITH DIFFERENTIAL/PLATELET
Abs Immature Granulocytes: 0.01 10*3/uL (ref 0.00–0.07)
Basophils Absolute: 0 10*3/uL (ref 0.0–0.1)
Basophils Relative: 0 %
Eosinophils Absolute: 0.2 10*3/uL (ref 0.0–0.5)
Eosinophils Relative: 3 %
HCT: 38.3 % — ABNORMAL LOW (ref 39.0–52.0)
Hemoglobin: 13 g/dL (ref 13.0–17.0)
Immature Granulocytes: 0 %
Lymphocytes Relative: 46 %
Lymphs Abs: 3.2 10*3/uL (ref 0.7–4.0)
MCH: 30.2 pg (ref 26.0–34.0)
MCHC: 33.9 g/dL (ref 30.0–36.0)
MCV: 88.9 fL (ref 80.0–100.0)
Monocytes Absolute: 0.5 10*3/uL (ref 0.1–1.0)
Monocytes Relative: 8 %
Neutro Abs: 2.9 10*3/uL (ref 1.7–7.7)
Neutrophils Relative %: 43 %
Platelets: 221 10*3/uL (ref 150–400)
RBC: 4.31 MIL/uL (ref 4.22–5.81)
RDW: 13 % (ref 11.5–15.5)
WBC: 6.9 10*3/uL (ref 4.0–10.5)
nRBC: 0 % (ref 0.0–0.2)

## 2021-05-10 LAB — RESP PANEL BY RT-PCR (FLU A&B, COVID) ARPGX2
Influenza A by PCR: NEGATIVE
Influenza B by PCR: NEGATIVE
SARS Coronavirus 2 by RT PCR: NEGATIVE

## 2021-05-10 LAB — POC SARS CORONAVIRUS 2 AG -  ED: SARS Coronavirus 2 Ag: NEGATIVE

## 2021-05-10 LAB — VALPROIC ACID LEVEL: Valproic Acid Lvl: 46 ug/mL — ABNORMAL LOW (ref 50.0–100.0)

## 2021-05-10 MED ORDER — LISINOPRIL 20 MG PO TABS
20.0000 mg | ORAL_TABLET | Freq: Every day | ORAL | Status: DC
Start: 2021-05-10 — End: 2021-05-10
  Administered 2021-05-10: 20 mg via ORAL
  Filled 2021-05-10: qty 7
  Filled 2021-05-10: qty 1

## 2021-05-10 MED ORDER — HYDROXYZINE HCL 25 MG PO TABS: 25.0000 mg | ORAL_TABLET | Freq: Three times a day (TID) | ORAL | Status: DC | PRN

## 2021-05-10 MED ORDER — MAGNESIUM HYDROXIDE 400 MG/5ML PO SUSP: 30.0000 mL | Freq: Every day | ORAL | Status: DC | PRN

## 2021-05-10 MED ORDER — ALUM & MAG HYDROXIDE-SIMETH 200-200-20 MG/5ML PO SUSP: 30.0000 mL | ORAL | Status: DC | PRN

## 2021-05-10 MED ORDER — HALOPERIDOL 5 MG PO TABS: 10.0000 mg | ORAL_TABLET | Freq: Every day | ORAL | Status: DC

## 2021-05-10 MED ORDER — HALOPERIDOL 10 MG PO TABS: 10.0000 mg | ORAL_TABLET | Freq: Every day | ORAL | 0 refills | Status: DC

## 2021-05-10 MED ORDER — TRAZODONE HCL 50 MG PO TABS: 50.0000 mg | ORAL_TABLET | Freq: Every evening | ORAL | Status: DC | PRN

## 2021-05-10 MED ORDER — HYDROCHLOROTHIAZIDE 12.5 MG PO CAPS
12.5000 mg | ORAL_CAPSULE | Freq: Every day | ORAL | Status: DC
Start: 2021-05-10 — End: 2021-05-10
  Administered 2021-05-10: 12.5 mg via ORAL
  Filled 2021-05-10: qty 7
  Filled 2021-05-10: qty 1

## 2021-05-10 MED ORDER — HALOPERIDOL 5 MG PO TABS
5.0000 mg | ORAL_TABLET | Freq: Every day | ORAL | Status: DC
  Administered 2021-05-10: 5 mg via ORAL
  Filled 2021-05-10: qty 1
  Filled 2021-05-10: qty 21

## 2021-05-10 MED ORDER — BENZTROPINE MESYLATE 0.5 MG PO TABS
0.5000 mg | ORAL_TABLET | Freq: Two times a day (BID) | ORAL | Status: DC
  Administered 2021-05-10: 0.5 mg via ORAL
  Filled 2021-05-10: qty 1
  Filled 2021-05-10: qty 14

## 2021-05-10 MED ORDER — ACETAMINOPHEN 325 MG PO TABS
650.0000 mg | ORAL_TABLET | Freq: Four times a day (QID) | ORAL | Status: DC | PRN
Start: 2021-05-10 — End: 2021-05-10

## 2021-05-10 MED ORDER — DIVALPROEX SODIUM ER 500 MG PO TB24
500.0000 mg | ORAL_TABLET | Freq: Every day | ORAL | Status: DC
  Administered 2021-05-10: 500 mg via ORAL
  Filled 2021-05-10: qty 14
  Filled 2021-05-10: qty 1

## 2021-05-10 NOTE — ED Notes (Signed)
PT woke up to have vitals taken and take 6 am medications. Pt is back in bed quietly with eyes closed, respirations are even/unlabored, environment check complete/safe, will continue to monitor patient for safety.

## 2021-05-10 NOTE — ED Notes (Signed)
PT has been brought on the unit, dash has been called to pick up patients blood/covid specimen to take the to the [facility] lab

## 2021-05-10 NOTE — Discharge Instructions (Addendum)
Take all medications as prescribed. Keep all follow-up appointments as scheduled.  Do not consume alcohol or use illegal drugs while on prescription medications. Report any adverse effects from your medications to your primary care provider promptly.  In the event of recurrent symptoms or worsening symptoms, call 911, a crisis hotline, or go to the nearest emergency department for evaluation.   

## 2021-05-10 NOTE — ED Notes (Signed)
Pt has taken medication, nutrition has been offered and accepted, patient ate a salad, chicken meal and drank apple juice, medication has been given. Pt is now sleeping, respirations are even/unlabored, environment check has been completed/secure will continue to monitor patient for safety.

## 2021-05-10 NOTE — ED Notes (Signed)
Patient is cooperative and pleasant. Shows appreciation for anything that is done for him. No violent or aggressive behavior displayed at this time.

## 2021-05-10 NOTE — ED Notes (Signed)
Patient is resting quietly in bed with eyes closed, Respiration is even and unlabored, woke up at 8 a.m. asked for something to eat. He was able to give a urine sample. Will continue to monitor

## 2021-05-10 NOTE — ED Provider Notes (Addendum)
[facility]/OBS ASAP Discharge Summary  Date and Time: 05/10/2021 2:59 PM  Name: Steven Clayton  MRN:  [id number]   Discharge Diagnoses:  Final diagnoses:  Schizoaffective disorder, bipolar type [facility])    Subjective: Steven Clayton reported " I am fine, I called the police on myself."   Stay Summary: Steven Clayton was seen and evaluated face-to-face.  He is awake, alert and oriented x3.  Patient observed sitting mood is congruent.  He is denying suicidal or homicidal ideations.  Denies auditory or visual hallucinations.  Per involuntary commitment -Respondent talks about being with deceased relative (brother), not taking medications, very belligerent with mother and called police on himself, known abuse to crack cocaine, diagnosed with schizophrenia.  Steven Clayton reports " I told her that I wish I could titrated places with my brother so he could have experience life,  I love my life. "  Denied previous suicide attempts.  Patient reports he has been taking medications as indicated.  Chart reviewed last Invega injection was 03/21/2021.  Does report abusing cocaine. "  Not all the time"  NP spoke to patient's mother Steven Clayton who reports " he is not able to come back to my home especially using drugs he is gotten all in my face. " she reports patient has not been medication compliant.  She is requesting that patient follow-up with inpatient treatment for substance abuse.  Education provided with substance abuse treatment and patient's willingness.  Steven Clayton reports she has attempted to obtain guardianship however has been unsuccessful.  We will make additional outpatient resources available.  NP to rescind involuntary commitment.  Support, encouragement reassurance was provided.  Total Time spent with patient: 15 minutes  Past Psychiatric History:  Past Medical History:  Past Medical History:  Diagnosis Date   Anxiety    Depression    Schizoaffective disorder (HCC)    Stroke (HCC)    No past surgical history on  file. Family History:  Family History  Problem Relation Age of Onset   Heart disease Mother    Hyperlipidemia Mother    Mental illness Father    Family Psychiatric History:  Social History:  Social History   Substance and Sexual Activity  Alcohol Use Yes   Alcohol/week: 1.0 standard drink   Types: 1 Glasses of wine per week     Social History   Substance and Sexual Activity  Drug Use No    Social History   Socioeconomic History   Marital status: Single    Spouse name: Not on file   Number of children: Not on file   Years of education: Not on file   Highest education level: Not on file  Occupational History   Not on file  Tobacco Use   Smoking status: Some Days    Packs/day: 2.00    Types: Cigarettes   Smokeless tobacco: Never  Vaping Use   Vaping Use: Never used  Substance and Sexual Activity   Alcohol use: Yes    Alcohol/week: 1.0 standard drink    Types: 1 Glasses of wine per week   Drug use: No   Sexual activity: Never  Other Topics Concern   Not on file  Social History Narrative   Not on file   Social Determinants of Health   Financial Resource Strain: Not on file  Food Insecurity: Not on file  Transportation Needs: Not on file  Physical Activity: Not on file  Stress: Not on file  Social Connections: Not on file   SDOH:  SDOH Screenings  Alcohol Screen: Not on file  Depression (PHQ2-9): Not on file  Financial Resource Strain: Not on file  Food Insecurity: Not on file  Housing: Not on file  Physical Activity: Not on file  Social Connections: Not on file  Stress: Not on file  Tobacco Use: High Risk   Smoking Tobacco Use: Some Days   Smokeless Tobacco Use: Never  Transportation Needs: Not on file    Tobacco Cessation:  A prescription for an FDA-approved tobacco cessation medication provided at discharge  Current Medications:  Current Facility-Administered Medications  Medication Dose Route Frequency Provider Last Rate Last Admin    acetaminophen (TYLENOL) tablet 650 mg  650 mg Oral Q6H PRN Jaclyn Shaggy, PA-C       alum & mag hydroxide-simeth (MAALOX/MYLANTA) 200-200-20 MG/5ML suspension 30 mL  30 mL Oral Q4H PRN Melbourne Abts W, PA-C       benztropine (COGENTIN) tablet 0.5 mg  0.5 mg Oral BID Melbourne Abts W, PA-C   0.5 mg at 05/10/21 0957   divalproex (DEPAKOTE ER) 24 hr tablet 500 mg  500 mg Oral QHS Melbourne Abts W, PA-C   500 mg at 05/10/21 3149   haloperidol (HALDOL) tablet 10 mg  10 mg Oral QHS Melbourne Abts W, PA-C       haloperidol (HALDOL) tablet 5 mg  5 mg Oral Q0600 Melbourne Abts W, PA-C   5 mg at 05/10/21 7026   hydrochlorothiazide (MICROZIDE) capsule 12.5 mg  12.5 mg Oral Daily Melbourne Abts W, PA-C   12.5 mg at 05/10/21 0957   hydrOXYzine (ATARAX/VISTARIL) tablet 25 mg  25 mg Oral TID PRN Jaclyn Shaggy, PA-C       lisinopril (ZESTRIL) tablet 20 mg  20 mg Oral Daily Melbourne Abts W, PA-C   20 mg at 05/10/21 3785   magnesium hydroxide (MILK OF MAGNESIA) suspension 30 mL  30 mL Oral Daily PRN Melbourne Abts W, PA-C       traZODone (DESYREL) tablet 50 mg  50 mg Oral QHS PRN Jaclyn Shaggy, PA-C       Current Outpatient Medications  Medication Sig Dispense Refill   benztropine (COGENTIN) 0.5 MG tablet Take 1 tablet (0.5 mg total) by mouth 2 (two) times daily. 60 tablet 0   haloperidol (HALDOL) 10 MG tablet Take 1 tablet (10 mg total) by mouth at bedtime. 30 tablet 0   hydrochlorothiazide (MICROZIDE) 12.5 MG capsule Take 1 capsule (12.5 mg total) by mouth daily. 30 capsule 0   paliperidone (INVEGA SUSTENNA) 156 MG/ML SUSY injection Inject into the muscle at [facility] on 03/25/21 1 mL 0   divalproex (DEPAKOTE ER) 500 MG 24 hr tablet Take 2 tablets (1,000 mg total) by mouth at bedtime. 60 tablet 0   haloperidol (HALDOL) 5 MG tablet Take 1 tablet (5 mg total) by mouth daily at 6 (six) AM. 30 tablet 0   lisinopril (ZESTRIL) 20 MG tablet Take 1 tablet (20 mg total) by mouth daily. 30 tablet 0   paliperidone (INVEGA SUSTENNA) 234  MG/1.5ML SUSY injection Inject 1 syringe 234 mg into the muscle in [facility] as directed 1.5 mL 0    PTA Medications: (Not in a hospital admission)   Musculoskeletal  Strength & Muscle Tone: within normal limits Gait & Station: normal Patient leans: N/A  Psychiatric Specialty Exam  Presentation  General Appearance: Appropriate for Environment  Eye Contact:Good  Speech:Clear and Coherent  Speech Volume:Normal  Handedness:Right   Mood and Affect  Mood:Anxious  Affect:Congruent  Thought Process  Thought Processes:Coherent  Descriptions of Associations:Intact  Orientation:Full (Time, Place and Person)  Thought Content:Logical  Diagnosis of Schizophrenia or Schizoaffective disorder in past: Yes  Duration of Psychotic Symptoms: Greater than six months   Hallucinations:Hallucinations: None  Ideas of Reference:None  Suicidal Thoughts:Suicidal Thoughts: No  Homicidal Thoughts:Homicidal Thoughts: No HI Passive Intent and/or Plan: Without Intent   Sensorium  Memory:Immediate Fair; Recent Fair  Judgment:Fair  Insight:Fair   Executive Functions  Concentration:Fair  Attention Span:Good  Recall:Good  Fund of Knowledge:Good  Language:Good   Psychomotor Activity  Psychomotor Activity:Psychomotor Activity: Normal   Assets  Assets:Social Support; Manufacturing systems engineer; Desire for Improvement   Sleep  Sleep:Sleep: Fair Number of Hours of Sleep: 6   Nutritional Assessment (For OBS and FBC admissions only) Has the patient had a weight loss or gain of 10 pounds or more in the last 3 months?: No Has the patient had a decrease in food intake/or appetite?: No Does the patient have dental problems?: No Does the patient have eating habits or behaviors that may be indicators of an eating disorder including binging or inducing vomiting?: No Has the patient recently lost weight without trying?: 0 Has the patient been eating poorly because of a decreased appetite?:  0 Malnutrition Screening Tool Score: 0 Nutritional Assessment Referrals: Refer to Social Work for [facility]   Physical Exam  Physical Exam Vitals and nursing note reviewed.  Eyes:     Pupils: Pupils are equal, round, and reactive to light.  Cardiovascular:     Rate and Rhythm: Normal rate and regular rhythm.     Pulses: Normal pulses.  Neurological:     Mental Status: He is oriented to person, place, and time.  Psychiatric:        Attention and Perception: Attention and perception normal.        Mood and Affect: Mood is anxious.        Speech: Speech normal.        Behavior: Behavior normal.        Thought Content: Thought content normal. Thought content does not include suicidal ideation. Thought content does not include suicidal plan.        Cognition and Memory: Cognition normal.        Judgment: Judgment normal.   Review of Systems  HENT: Negative.    Cardiovascular: Negative.   Genitourinary: Negative.   Psychiatric/Behavioral:  Positive for substance abuse. Negative for depression and suicidal ideas. The patient is not nervous/anxious.   All other systems reviewed and are negative. Blood pressure 126/78, pulse (!) 54, temperature 98.2 F (36.8 C), temperature source Oral, resp. rate 18, SpO2 99 %. There is no height or weight on file to calculate BMI.  Demographic Factors:  Male, Low socioeconomic status, and Unemployed  Loss Factors: Loss of significant relationship  Historical Factors: Family history of mental illness or substance abuse  Risk Reduction Factors:   Living with another person, especially a relative  Continued Clinical Symptoms:  Schizophrenia:   Paranoid or undifferentiated type  Cognitive Features That Contribute To Risk:  Closed-mindedness    Suicide Risk:  Minimal: No identifiable suicidal ideation.  Patients presenting with no risk factors but with morbid ruminations; may be classified as minimal risk based on the severity of the  depressive symptoms  Plan Of Care/Follow-up recommendations:  Activity:  as tolerated  Diet:  heart healthy   Disposition: Take all medications as prescribed. Keep all follow-up appointments as scheduled.  Do not consume alcohol  or use illegal drugs while on prescription medications. Report any adverse effects from your medications to your primary care provider promptly.  In the event of recurrent symptoms or worsening symptoms, call 911, a crisis hotline, or go to the nearest emergency department for evaluation.    Oneta Rack, NP 05/10/2021, 2:59 PM

## 2021-05-10 NOTE — ED Provider Notes (Signed)
Behavioral Health Admission H&P [facility] & OBS)  Date: 05/10/21 Patient Name: Steven Clayton MRN: [id number] Chief Complaint: IVC     Diagnoses:  Final diagnoses:  Schizoaffective disorder, bipolar type [facility])    HPI: Steven Clayton is a 40 year old male with past psychiatric history significant for schizoaffective disorder, bipolar type and past medical history significant for essential hypertension, who presents to the [facility] behavioral health urgent care [facility]) via law enforcement under IVC.  Patient petitioned for IVC by his mother Steven Clayton: [phone]).  Patient's affidavit and petition for IVC states as follows:  "-TALKS ABOUT BEING WITH DECEASED RELATIVE (BROTHER) -NOT TAKING MEDICATIONS  -VERY BELLIGERENT WITH MOTHER AND CALLED POLICE ON HIMSELF  -KNOWN TO ABUSE CRACK COCAINE -DIAGNOSED WITH SCHIZOPHRENIA"  Please see patient's IVC paperwork for further details if necessary.  When patient was asked why he believes he was brought to the behavioral health urgent care by police, patient states "my mom was just egging on the situation and making it worse and making me upset".  When patient is asked to provide further details regarding this, patient states that he got into a verbal altercation with his mother last night on 05/09/2021.  Patient denies any physical altercation with his mother last night on 05/09/2021.  Patient also states that his mother is wanting to "cut open my brain and study me".  Patient does not provide further details regarding what transpired between him and his mother on the evening of 05/09/2021 that may have led to him being brought to the [facility] behavioral health urgent care.  He denies SI.  He denies history of any past suicide attempts or history of self-injurious behavior via intentionally cutting or burning himself.  He denies HI.  He denies auditory or visual hallucinations currently on exam, but he does endorse chronic history of AVH.   Patient describes his auditory hallucinations as "hearing loud music and demon noises".  He describes his visual hallucinations as "seeing horror films".  Patient does not provide further details regarding his AVH.  He reports he has been sleeping poorly, but states he is sleeping about 6 hours per night.  He denies anhedonia but does endorse feelings of guilt, hopelessness, and worthlessness.  He denies energy changes, concentration changes, or appetite changes.  He reports that he has lost about 100 pounds over the past year, but he states that this weight loss was intentional due to him reportedly being overweight/obese in the past.  He endorses being easily distracted.  Per chart review, patient was psychiatrically hospitalized at [facility]) from 03/17/2021 to 04/01/2021, received Hinda Glatter Sustenna 234 mg IM on 03/21/2021 while at the behavioral health Hospital, received Gean Birchwood 146 mg IM on 03/25/2021 while at the behavioral health Hospital, and upon patient being discharged from the behavioral health Hospital on 04/01/2021, patient was discharged on Cogentin 0.5 mg p.o. twice daily for extrapyramidal reaction caused by medications, Depakote extended release 1000 mg p.o. at bedtime for manic phase of manic depression/mixed bipolar affective disorder/schizoaffective disorder, Haldol 5 mg p.o. daily at 6 AM for mixed bipolar affective disorder/psychosis, and Haldol 10 mg p.o. at bedtime for manic phase of manic depression/psychosis.  Patient states that he has not seen an outpatient psychiatrist since he was discharged from the behavioral health Hospital on 04/01/2021, but he does state that he has been taking the above medications as prescribed.  Patient states that he brought his medication bottles with him and that they are in his locker  currently at the behavioral health urgent care.  With assistance of security, patient's prescription bottles were retrieved from patient's locker  and patient's medications were reviewed personally by myself with the patient which included 30-day supply prescriptions with 0 refills that were all provided on 04/01/2021 by [facility] provider and included the following prescriptions: Cogentin 0.5 mg p.o. twice daily, Depakote extended release 1000 mg p.o. at bedtime, Haldol 5 mg p.o. daily at 6 AM, Haldol 10 mg p.o. at bedtime, hydrochlorothiazide 12.5 mg p.o. daily, and lisinopril 20 mg p.o. daily.  Upon reviewing these medications with the patient, there were remaining tablets in all of patient's prescription bottles except for patient's Depakote ER 1000 mg and Haldol 5 mg p.o. daily prescription bottles.  Patient states that he ran out of his Depakote ER 1000 mg and Haldol 5 mg prescriptions a few days ago on either 05/07/2021 and 05/08/2021.  Per chart review, I am unable to verify any additional prescriptions in patient's chart.  Patient states he is not taking any additional home medications at this time.  He reports that he has not received any additional antipsychotic injections since being discharged from the behavioral health Hospital in August 2022.  Patient does not have a therapist or psychiatrist at this time.  He reports that he has not been psychiatrically hospitalized on any additional occasions since he was discharged from behavioral health Hospital on 04/01/2021.  He reports that prior to going to the behavioral health Hospital in July 2022, he was receiving psychotropic outpatient medication management from a physician named "Dr. Merlyn Clayton" at [facility].  Per chart review, there is documentation of patient having office visits with a provider named Steven Clayton, but the most recent visit I can see from the chart review is from September 2020.  Per chart review of patient's 04/01/2021 behavioral health hospital discharge summary, patient was instructed to attend [facility] walk-in hours for medication management and attend an  04/01/2021 at 10:00 AM [facility], and it was also documented that a referral has been placed for the patient on patient's behalf for ACTT team services through [facility] Patient states that he does not know what [facility] is and he states that he never received any information regarding being set up with an ACT team.  Patient also states that he did not attend any outpatient psychiatry or therapy appointments upon being discharged from behavioral health Hospital last month.  Patient reports that he is currently living in [place] with his mother, but he states that this housing situation is temporary.  He denies access to firearms or weapons.  Patient reports drinking wine "not often" but does not provide further details regarding frequency/quantity of alcohol use.  He reports that he last consumed an unknown amount of alcohol 3 to 4 weeks ago.  He denies history of alcohol withdrawal symptoms or seizures.  He endorses smoking 1 pack of cigarettes daily.  He endorses smoking an unknown amount of marijuana a few times per month.  He reports that his last marijuana consumption was 1 blunt within the past 24 hours.  Patient endorses history of using crack cocaine but he states that he has not used any crack cocaine for the past 1 to 2 years (this is contrary to patient's affidavit for IVC paperwork that stated patient abuses crack cocaine).  Patient denies any additional substance use.  Patient reports that he is unemployed at this time and is on disability for schizophrenia.  On exam, patient is sitting in a chair appearing restless but in no acute distress.  His mood is irritable with congruent, labile affect.  Patient is tangential, switching topics very quickly and frequently throughout the evaluation.  Speech is pressured.  Patient is delusional.  He mentions his mother wanting to cut his brain open to study him and also states "my eyes are not  bleeding blood".  Patient also states that he wants this writer's job to praise this Clinical research associate, but does not provide further details regarding this.  He is alert and oriented x4 and mostly cooperative.  No indication that patient is responding to internal or external stimuli on exam.  Myself and Beatriz Stallion, LCAS attempted to obtain collateral information from patient's mother/IVC petitioner by contacting patient's mother/IVC petitioner Steven Clayton: [phone]) via phone, but patient's mother did not answer.  HIPAA compliant voicemail was left.  PHQ 2-9:   Flowsheet Row ED from 05/10/2021 in [facility] Admission (Discharged) from 03/17/2021 in [facility] INPATIENT ADULT 500B ED from 03/14/2021 in [facility] EMERGENCY DEPARTMENT  C-SSRS RISK CATEGORY No Risk No Risk No Risk        Total Time spent with patient: 30 minutes  Musculoskeletal  Strength & Muscle Tone: within normal limits Gait & Station: normal Patient leans: N/A  Psychiatric Specialty Exam  Presentation General Appearance: Bizarre; Well Groomed  Eye Contact:Good  Speech:Pressured  Speech Volume:Increased  Handedness:Right   Mood and Affect  Mood:Irritable  Affect:Congruent; Labile   Thought Process  Thought Processes:Disorganized  Descriptions of Associations:Tangential  Orientation:Full (Time, Place and Person)  Thought Content:Tangential; Delusions  Diagnosis of Schizophrenia or Schizoaffective disorder in past: Yes  Duration of Psychotic Symptoms: Greater than six months  Hallucinations:Hallucinations: -- (Patient denies AVH currently on exam, but does endorse chronic history of intermittent AVH (see HPI for details).)  Ideas of Reference:Delusions  Suicidal Thoughts:Suicidal Thoughts: No  Homicidal Thoughts:Homicidal Thoughts: No   Sensorium  Memory:Immediate Fair; Recent Fair; Remote  Fair  Judgment:Fair  Insight:Fair   Executive Functions  Concentration:Fair  Attention Span:Fair  Recall:Fair  Fund of Knowledge:Fair  Language:Fair   Psychomotor Activity  Psychomotor Activity:Psychomotor Activity: Restlessness   Assets  Assets:Communication Skills; Desire for Improvement; Financial Resources/Insurance; Housing; Leisure Time; Physical Health; Social Support   Sleep  Sleep:Sleep: Poor Number of Hours of Sleep: 6   Nutritional Assessment (For OBS and FBC admissions only) Has the patient had a weight loss or gain of 10 pounds or more in the last 3 months?: Yes Has the patient had a decrease in food intake/or appetite?: No Does the patient have dental problems?: No Does the patient have eating habits or behaviors that may be indicators of an eating disorder including binging or inducing vomiting?: No Has the patient recently lost weight without trying?: 0 (Patient states he has lost 100 lbs over the past year, but he states that this was intentional due to him reporting he was overweight/obese in the past.) Has the patient been eating poorly because of a decreased appetite?: 0 Malnutrition Screening Tool Score: 0   Physical Exam Vitals reviewed.  Constitutional:      General: He is not in acute distress.    Appearance: He is not ill-appearing, toxic-appearing or diaphoretic.  HENT:     Head: Normocephalic and atraumatic.     Right Ear: External ear normal.     Left Ear: External ear normal.     Nose: Nose normal.  Eyes:  General:        Right eye: No discharge.        Left eye: No discharge.     Conjunctiva/sclera: Conjunctivae normal.  Cardiovascular:     Rate and Rhythm: Normal rate.  Pulmonary:     Effort: Pulmonary effort is normal. No respiratory distress.  Musculoskeletal:        General: Normal range of motion.     Cervical back: Normal range of motion.  Neurological:     General: No focal deficit present.     Mental Status: He  is alert and oriented to person, place, and time.     Comments: No tremor noted.   Psychiatric:        Speech: Speech is rapid and pressured.        Behavior: Behavior is agitated. Behavior is not slowed, aggressive, withdrawn, hyperactive or combative. Behavior is cooperative.        Thought Content: Thought content is delusional. Thought content is not paranoid. Thought content does not include homicidal or suicidal ideation.     Comments: Patient denies AVH currently on exam, but does endorse chronic history of intermittent AVH (see HPI for details.    Review of Systems  Constitutional:  Positive for weight loss. Negative for chills, diaphoresis, fever and malaise/fatigue.  HENT:  Negative for congestion.   Respiratory:  Negative for cough and shortness of breath.   Cardiovascular:  Negative for chest pain and palpitations.  Gastrointestinal:  Negative for abdominal pain, constipation, diarrhea, nausea and vomiting.  Musculoskeletal:  Negative for joint pain and myalgias.  Neurological:  Negative for dizziness, tremors, seizures and headaches.  Psychiatric/Behavioral:  Positive for hallucinations. Negative for depression, memory loss and suicidal ideas. The patient is nervous/anxious and has insomnia.   All other systems reviewed and are negative.  Vitals: Blood pressure (!) 146/100, pulse 69, temperature 98.6 F (37 C), temperature source Oral, resp. rate 18, SpO2 100 %. There is no height or weight on file to calculate BMI.  Past Psychiatric History: Schizoaffective Disorder, Bipolar Type- [facility] admission from 03/17/21 - 04/01/21.   Is the patient at risk to self?  Yes, per IVC paperwork  Has the patient been a risk to self in the past 6 months? No .    Has the patient been a risk to self within the distant past? No   Is the patient a risk to others?  Yes, per IVC paperwork    Has the patient been a risk to others in the past 6 months? No   Has the patient been a risk to others within  the distant past? No   Past Medical History:  Past Medical History:  Diagnosis Date   Anxiety    Depression    Schizoaffective disorder (HCC)    Stroke (HCC)    No past surgical history on file.  Family History:  Family History  Problem Relation Age of Onset   Heart disease Mother    Hyperlipidemia Mother    Mental illness Father     Social History:  Social History   Socioeconomic History   Marital status: Single    Spouse name: Not on file   Number of children: Not on file   Years of education: Not on file   Highest education level: Not on file  Occupational History   Not on file  Tobacco Use   Smoking status: Some Days    Packs/day: 2.00    Types: Cigarettes   Smokeless tobacco:  Never  Vaping Use   Vaping Use: Never used  Substance and Sexual Activity   Alcohol use: Yes    Alcohol/week: 1.0 standard drink    Types: 1 Glasses of wine per week   Drug use: No   Sexual activity: Never  Other Topics Concern   Not on file  Social History Narrative   Not on file   Social Determinants of Health   Financial Resource Strain: Not on file  Food Insecurity: Not on file  Transportation Needs: Not on file  Physical Activity: Not on file  Stress: Not on file  Social Connections: Not on file  Intimate Partner Violence: Not on file    SDOH:  SDOH Screenings   Alcohol Screen: Not on file  Depression (PHQ2-9): Not on file  Financial Resource Strain: Not on file  Food Insecurity: Not on file  Housing: Not on file  Physical Activity: Not on file  Social Connections: Not on file  Stress: Not on file  Tobacco Use: High Risk   Smoking Tobacco Use: Some Days   Smokeless Tobacco Use: Never  Transportation Needs: Not on file    Last Labs:  Admission on 05/10/2021  Component Date Value Ref Range Status   SARS Coronavirus 2 Ag 05/10/2021 Negative  Negative Preliminary  Admission on 03/17/2021, Discharged on 04/01/2021  Component Date Value Ref Range Status   Hgb  A1c MFr Bld 03/17/2021 5.5  4.8 - 5.6 % Final   Comment: (NOTE)         Prediabetes: 5.7 - 6.4         Diabetes: >6.4         Glycemic control for adults with diabetes: <7.0    Mean Plasma Glucose 03/17/2021 111  mg/dL Final   Comment: (NOTE) Performed At: [facility] [address] [place], [place] [id number] Jolene Schimke MD WU:[id number]    Cholesterol 03/17/2021 182  0 - 200 mg/dL Final   Triglycerides 95/62/1308 118  <150 mg/dL Final   HDL 65/78/4696 56  >40 mg/dL Final   Total CHOL/HDL Ratio 03/17/2021 3.3  RATIO Final   VLDL 03/17/2021 24  0 - 40 mg/dL Final   LDL Cholesterol 03/17/2021 102 (A) 0 - 99 mg/dL Final   Comment:        Total Cholesterol/HDL:CHD Risk Coronary Heart Disease Risk Table                     Men   Women  1/2 Average Risk   3.4   3.3  Average Risk       5.0   4.4  2 X Average Risk   9.6   7.1  3 X Average Risk  23.4   11.0        Use the calculated Patient Ratio above and the CHD Risk Table to determine the patient's CHD Risk.        ATP III CLASSIFICATION (LDL):  <100     mg/dL   Optimal  295-284  mg/dL   Near or Above                    Optimal  130-159  mg/dL   Borderline  132-440  mg/dL   High  >102     mg/dL   Very High Performed at [facility], [address], [place], [place] [address]    Prolactin 03/17/2021 33.3 (A) 4.0 - 15.2 ng/mL Final   Comment: (NOTE) Performed At:  [facility]  [address] [place], [place] [id number] Jolene Schimke MD WG:[id number]    TSH 03/17/2021 1.898  0.350 - 4.500 uIU/mL Final   Comment: Performed by a 3rd Generation assay with a functional sensitivity of <=0.01 uIU/mL. Performed at [facility], [address] [place], [place] [address]    HIV Screen 4th Generation wRfx 03/19/2021 Non Reactive  Non Reactive Final   Performed at [facility], [address], [place], [place] [address]   RPR Ser Ql 03/19/2021 NON REACTIVE  NON REACTIVE  Final   Performed at [facility], [address], [place], [place] [address]   Neisseria Gonorrhea 03/19/2021 Negative   Final   Chlamydia 03/19/2021 Negative   Final   Comment 03/19/2021 Normal Reference Ranger Chlamydia - Negative   Final   Comment 03/19/2021 Normal Reference Range Neisseria Gonorrhea - Negative   Final   Valproic Acid Lvl 03/23/2021 70  50.0 - 100.0 ug/mL Final   Performed at [facility], [address], [place], [place] [address]   WBC 03/23/2021 7.2  4.0 - 10.5 K/uL Final   RBC 03/23/2021 3.94 (A) 4.22 - 5.81 MIL/uL Final   Hemoglobin 03/23/2021 11.9 (A) 13.0 - 17.0 g/dL Final   HCT 27/25/3664 36.8 (A) 39.0 - 52.0 % Final   MCV 03/23/2021 93.4  80.0 - 100.0 fL Final   MCH 03/23/2021 30.2  26.0 - 34.0 pg Final   MCHC 03/23/2021 32.3  30.0 - 36.0 g/dL Final   RDW 40/34/7425 14.0  11.5 - 15.5 % Final   Platelets 03/23/2021 230  150 - 400 K/uL Final   nRBC 03/23/2021 0.0  0.0 - 0.2 % Final   Neutrophils Relative % 03/23/2021 42  % Final   Neutro Abs 03/23/2021 3.0  1.7 - 7.7 K/uL Final   Lymphocytes Relative 03/23/2021 46  % Final   Lymphs Abs 03/23/2021 3.3  0.7 - 4.0 K/uL Final   Monocytes Relative 03/23/2021 7  % Final   Monocytes Absolute 03/23/2021 0.5  0.1 - 1.0 K/uL Final   Eosinophils Relative 03/23/2021 5  % Final   Eosinophils Absolute 03/23/2021 0.3  0.0 - 0.5 K/uL Final   Basophils Relative 03/23/2021 0  % Final   Basophils Absolute 03/23/2021 0.0  0.0 - 0.1 K/uL Final   Immature Granulocytes 03/23/2021 0  % Final   Abs Immature Granulocytes 03/23/2021 0.01  0.00 - 0.07 K/uL Final   Performed at [facility], [address], [place], [place] [address]   Sodium 03/23/2021 138  135 - 145 mmol/L Final   Potassium 03/23/2021 3.8  3.5 - 5.1 mmol/L Final   Chloride 03/23/2021 98  98 - 111 mmol/L Final   CO2 03/23/2021 31  22 - 32 mmol/L Final   Glucose, Bld 03/23/2021 110 (A) 70 - 99 mg/dL Final   Glucose  reference range applies only to samples taken after fasting for at least 8 hours.   BUN 03/23/2021 13  6 - 20 mg/dL Final   Creatinine, Ser 03/23/2021 0.87  0.61 - 1.24 mg/dL Final   Calcium 75/64/3329 9.3  8.9 - 10.3 mg/dL Final   Total Protein 51/88/4166 6.6  6.5 - 8.1 g/dL Final   Albumin 02/18/1600 4.0  3.5 - 5.0 g/dL Final   AST 09/32/3557 26  15 - 41 U/L Final   ALT 03/23/2021 22  0 - 44 U/L Final   Alkaline Phosphatase 03/23/2021 80  38 - 126  U/L Final   Total Bilirubin 03/23/2021 0.3  0.3 - 1.2 mg/dL Final   GFR, Estimated 03/23/2021 >60  >60 mL/min Final   Comment: (NOTE) Calculated using the CKD-EPI Creatinine Equation (2021)    Anion gap 03/23/2021 9  5 - 15 Final   Performed at [facility], [address], [place], [place] [address]   SARS Coronavirus 2 by RT PCR 03/29/2021 NEGATIVE  NEGATIVE Final   Comment: (NOTE) SARS-CoV-2 target nucleic acids are NOT DETECTED.  The SARS-CoV-2 RNA is generally detectable in upper respiratory specimens during the acute phase of infection. The lowest concentration of SARS-CoV-2 viral copies this assay can detect is 138 copies/mL. A negative result does not preclude SARS-Cov-2 infection and should not be used as the sole basis for treatment or other patient management decisions. A negative result may occur with  improper specimen collection/handling, submission of specimen other than nasopharyngeal swab, presence of viral mutation(s) within the areas targeted by this assay, and inadequate number of viral copies(<138 copies/mL). A negative result must be combined with clinical observations, patient history, and epidemiological information. The expected result is Negative.  Fact Sheet for Patients:  BloggerCourse.com  Fact Sheet for Healthcare Providers:  SeriousBroker.it  This test is no                          t yet approved or cleared by the [place] FDA  and  has been authorized for detection and/or diagnosis of SARS-CoV-2 by FDA under an Emergency Use Authorization (EUA). This EUA will remain  in effect (meaning this test can be used) for the duration of the COVID-19 declaration under Section 564(b)(1) of the Act, 21 U.S.C.section 360bbb-3(b)(1), unless the authorization is terminated  or revoked sooner.       Influenza A by PCR 03/29/2021 NEGATIVE  NEGATIVE Final   Influenza B by PCR 03/29/2021 NEGATIVE  NEGATIVE Final   Comment: (NOTE) The Xpert Xpress SARS-CoV-2/FLU/RSV plus assay is intended as an aid in the diagnosis of influenza from Nasopharyngeal swab specimens and should not be used as a sole basis for treatment. Nasal washings and aspirates are unacceptable for Xpert Xpress SARS-CoV-2/FLU/RSV testing.  Fact Sheet for Patients: BloggerCourse.com  Fact Sheet for Healthcare Providers: SeriousBroker.it  This test is not yet approved or cleared by the [place] FDA and has been authorized for detection and/or diagnosis of SARS-CoV-2 by FDA under an Emergency Use Authorization (EUA). This EUA will remain in effect (meaning this test can be used) for the duration of the COVID-19 declaration under Section 564(b)(1) of the Act, 21 U.S.C. section 360bbb-3(b)(1), unless the authorization is terminated or revoked.  Performed at [facility], [address] [place], [place] [address]    Sodium 03/29/2021 139  135 - 145 mmol/L Final   Potassium 03/29/2021 4.2  3.5 - 5.1 mmol/L Final   Chloride 03/29/2021 101  98 - 111 mmol/L Final   CO2 03/29/2021 28  22 - 32 mmol/L Final   Glucose, Bld 03/29/2021 102 (A) 70 - 99 mg/dL Final   Glucose reference range applies only to samples taken after fasting for at least 8 hours.   BUN 03/29/2021 18  6 - 20 mg/dL Final   Creatinine, Ser 03/29/2021 1.12  0.61 - 1.24 mg/dL Final   Calcium 62/13/0865 9.3  8.9 - 10.3 mg/dL  Final   Total Protein 03/29/2021 6.3 (A) 6.5 - 8.1 g/dL Final   Albumin 78/46/9629  3.6  3.5 - 5.0 g/dL Final   AST 29/56/2130 31  15 - 41 U/L Final   ALT 03/29/2021 45 (A) 0 - 44 U/L Final   Alkaline Phosphatase 03/29/2021 86  38 - 126 U/L Final   Total Bilirubin 03/29/2021 0.2 (A) 0.3 - 1.2 mg/dL Final   GFR, Estimated 03/29/2021 >60  >60 mL/min Final   Comment: (NOTE) Calculated using the CKD-EPI Creatinine Equation (2021)    Anion gap 03/29/2021 10  5 - 15 Final   Performed at [facility], [address], [place], [place] [address]   WBC 03/29/2021 8.1  4.0 - 10.5 K/uL Final   RBC 03/29/2021 3.75 (A) 4.22 - 5.81 MIL/uL Final   Hemoglobin 03/29/2021 11.5 (A) 13.0 - 17.0 g/dL Final   HCT 46/96/2952 34.5 (A) 39.0 - 52.0 % Final   MCV 03/29/2021 92.0  80.0 - 100.0 fL Final   MCH 03/29/2021 30.7  26.0 - 34.0 pg Final   MCHC 03/29/2021 33.3  30.0 - 36.0 g/dL Final   RDW 84/13/2440 14.3  11.5 - 15.5 % Final   Platelets 03/29/2021 219  150 - 400 K/uL Final   nRBC 03/29/2021 0.0  0.0 - 0.2 % Final   Neutrophils Relative % 03/29/2021 45  % Final   Neutro Abs 03/29/2021 3.7  1.7 - 7.7 K/uL Final   Lymphocytes Relative 03/29/2021 39  % Final   Lymphs Abs 03/29/2021 3.1  0.7 - 4.0 K/uL Final   Monocytes Relative 03/29/2021 10  % Final   Monocytes Absolute 03/29/2021 0.8  0.1 - 1.0 K/uL Final   Eosinophils Relative 03/29/2021 5  % Final   Eosinophils Absolute 03/29/2021 0.4  0.0 - 0.5 K/uL Final   Basophils Relative 03/29/2021 1  % Final   Basophils Absolute 03/29/2021 0.0  0.0 - 0.1 K/uL Final   Immature Granulocytes 03/29/2021 0  % Final   Abs Immature Granulocytes 03/29/2021 0.03  0.00 - 0.07 K/uL Final   Performed at [facility], [address], [place], [place] [address]  Admission on 03/14/2021, Discharged on 03/17/2021  Component Date Value Ref Range Status   Sodium 03/14/2021 139  135 - 145 mmol/L Final   Potassium 03/14/2021 3.5  3.5 - 5.1  mmol/L Final   Chloride 03/14/2021 102  98 - 111 mmol/L Final   CO2 03/14/2021 27  22 - 32 mmol/L Final   Glucose, Bld 03/14/2021 84  70 - 99 mg/dL Final   Glucose reference range applies only to samples taken after fasting for at least 8 hours.   BUN 03/14/2021 11  6 - 20 mg/dL Final   Creatinine, Ser 03/14/2021 1.31 (A) 0.61 - 1.24 mg/dL Final   Calcium 53/66/4403 9.8  8.9 - 10.3 mg/dL Final   Total Protein 47/42/5956 7.5  6.5 - 8.1 g/dL Final   Albumin 38/75/6433 4.2  3.5 - 5.0 g/dL Final   AST 29/51/8841 17  15 - 41 U/L Final   ALT 03/14/2021 14  0 - 44 U/L Final   Alkaline Phosphatase 03/14/2021 66  38 - 126 U/L Final   Total Bilirubin 03/14/2021 0.8  0.3 - 1.2 mg/dL Final   GFR, Estimated 03/14/2021 >60  >60 mL/min Final   Comment: (NOTE) Calculated using the CKD-EPI Creatinine Equation (2021)    Anion gap 03/14/2021 10  5 - 15 Final   Performed at [facility], [address] [place], [place] [address]   Alcohol, Ethyl (B) 03/14/2021 <10  <  10 mg/dL Final   Comment: (NOTE) Lowest detectable limit for serum alcohol is 10 mg/dL.  For medical purposes only. Performed at [facility], [address], [place], [place] [address]    WBC 03/14/2021 5.4  4.0 - 10.5 K/uL Final   RBC 03/14/2021 4.55  4.22 - 5.81 MIL/uL Final   Hemoglobin 03/14/2021 14.0  13.0 - 17.0 g/dL Final   HCT 60/45/4098 41.0  39.0 - 52.0 % Final   MCV 03/14/2021 90.1  80.0 - 100.0 fL Final   MCH 03/14/2021 30.8  26.0 - 34.0 pg Final   MCHC 03/14/2021 34.1  30.0 - 36.0 g/dL Final   RDW 11/91/4782 13.5  11.5 - 15.5 % Final   Platelets 03/14/2021 288  150 - 400 K/uL Final   nRBC 03/14/2021 0.0  0.0 - 0.2 % Final   Performed at [facility], [address], [place], [place] [address]   Opiates 03/15/2021 NONE DETECTED  NONE DETECTED Final   Cocaine 03/15/2021 POSITIVE (A) NONE DETECTED Final   Benzodiazepines 03/15/2021 NONE DETECTED  NONE DETECTED Final   Amphetamines 03/15/2021 NONE  DETECTED  NONE DETECTED Final   Tetrahydrocannabinol 03/15/2021 POSITIVE (A) NONE DETECTED Final   Barbiturates 03/15/2021 NONE DETECTED  NONE DETECTED Final   Comment: (NOTE) DRUG SCREEN FOR MEDICAL PURPOSES ONLY.  IF CONFIRMATION IS NEEDED FOR ANY PURPOSE, NOTIFY LAB WITHIN 5 DAYS.  LOWEST DETECTABLE LIMITS FOR URINE DRUG SCREEN Drug Class                     Cutoff (ng/mL) Amphetamine and metabolites    1000 Barbiturate and metabolites    200 Benzodiazepine                 200 Tricyclics and metabolites     300 Opiates and metabolites        300 Cocaine and metabolites        300 THC                            50 Performed at [facility], [address], [place], [place] [address]    SARS Coronavirus 2 by RT PCR 03/14/2021 NEGATIVE  NEGATIVE Final   Comment: (NOTE) SARS-CoV-2 target nucleic acids are NOT DETECTED.  The SARS-CoV-2 RNA is generally detectable in upper respiratory specimens during the acute phase of infection. The lowest concentration of SARS-CoV-2 viral copies this assay can detect is 138 copies/mL. A negative result does not preclude SARS-Cov-2 infection and should not be used as the sole basis for treatment or other patient management decisions. A negative result may occur with  improper specimen collection/handling, submission of specimen other than nasopharyngeal swab, presence of viral mutation(s) within the areas targeted by this assay, and inadequate number of viral copies(<138 copies/mL). A negative result must be combined with clinical observations, patient history, and epidemiological information. The expected result is Negative.  Fact Sheet for Patients:  BloggerCourse.com  Fact Sheet for Healthcare Providers:  SeriousBroker.it  This test is no                          t yet approved or cleared by the [place] FDA and  has been authorized for detection and/or diagnosis of  SARS-CoV-2 by FDA under an Emergency Use Authorization (EUA). This EUA will remain  in effect (meaning this test can be used)  for the duration of the COVID-19 declaration under Section 564(b)(1) of the Act, 21 U.S.C.section 360bbb-3(b)(1), unless the authorization is terminated  or revoked sooner.       Influenza A by PCR 03/14/2021 NEGATIVE  NEGATIVE Final   Influenza B by PCR 03/14/2021 NEGATIVE  NEGATIVE Final   Comment: (NOTE) The Xpert Xpress SARS-CoV-2/FLU/RSV plus assay is intended as an aid in the diagnosis of influenza from Nasopharyngeal swab specimens and should not be used as a sole basis for treatment. Nasal washings and aspirates are unacceptable for Xpert Xpress SARS-CoV-2/FLU/RSV testing.  Fact Sheet for Patients: BloggerCourse.com  Fact Sheet for Healthcare Providers: SeriousBroker.it  This test is not yet approved or cleared by the [place] FDA and has been authorized for detection and/or diagnosis of SARS-CoV-2 by FDA under an Emergency Use Authorization (EUA). This EUA will remain in effect (meaning this test can be used) for the duration of the COVID-19 declaration under Section 564(b)(1) of the Act, 21 U.S.C. section 360bbb-3(b)(1), unless the authorization is terminated or revoked.  Performed at [facility], [address], [place], [place] [address]    Acetaminophen (Tylenol), Serum 03/14/2021 <10 (A) 10 - 30 ug/mL Final   Comment: (NOTE) Therapeutic concentrations vary significantly. A range of 10-30 ug/mL  may be an effective concentration for many patients. However, some  are best treated at concentrations outside of this range. Acetaminophen concentrations >150 ug/mL at 4 hours after ingestion  and >50 ug/mL at 12 hours after ingestion are often associated with  toxic reactions.  Performed at [facility], [address], [place], [place] [address]    Salicylate Lvl 03/14/2021  <7.0 (A) 7.0 - 30.0 mg/dL Final   Performed at [facility], [address], [place], [place] [address]   Valproic Acid Lvl 03/15/2021 <10 (A) 50.0 - 100.0 ug/mL Final   Comment: RESULTS CONFIRMED BY MANUAL DILUTION Performed at [facility], [address], [place], [place] [address]    Color, Urine 03/15/2021 STRAW (A) YELLOW Final   APPearance 03/15/2021 CLEAR  CLEAR Final   Specific Gravity, Urine 03/15/2021 1.003 (A) 1.005 - 1.030 Final   pH 03/15/2021 7.0  5.0 - 8.0 Final   Glucose, UA 03/15/2021 NEGATIVE  NEGATIVE mg/dL Final   Hgb urine dipstick 03/15/2021 NEGATIVE  NEGATIVE Final   Bilirubin Urine 03/15/2021 NEGATIVE  NEGATIVE Final   Ketones, ur 03/15/2021 NEGATIVE  NEGATIVE mg/dL Final   Protein, ur 65/78/4696 NEGATIVE  NEGATIVE mg/dL Final   Nitrite 29/52/8413 NEGATIVE  NEGATIVE Final   Leukocytes,Ua 03/15/2021 NEGATIVE  NEGATIVE Final   Performed at [facility], [address], [place], [place] [address]   SARS Coronavirus 2 by RT PCR 03/17/2021 NEGATIVE  NEGATIVE Final   Comment: (NOTE) SARS-CoV-2 target nucleic acids are NOT DETECTED.  The SARS-CoV-2 RNA is generally detectable in upper respiratory specimens during the acute phase of infection. The lowest concentration of SARS-CoV-2 viral copies this assay can detect is 138 copies/mL. A negative result does not preclude SARS-Cov-2 infection and should not be used as the sole basis for treatment or other patient management decisions. A negative result may occur with  improper specimen collection/handling, submission of specimen other than nasopharyngeal swab, presence of viral mutation(s) within the areas targeted by this assay, and inadequate number of viral copies(<138 copies/mL). A negative result must be combined with clinical observations, patient history, and epidemiological information. The expected result is Negative.  Fact Sheet for Patients:  BloggerCourse.com  Fact Sheet for Healthcare Providers:  SeriousBroker.it  This test is no                          t yet approved or cleared by the [place] FDA and  has been authorized for detection and/or diagnosis of SARS-CoV-2 by FDA under an Emergency Use Authorization (EUA). This EUA will remain  in effect (meaning this test can be used) for the duration of the COVID-19 declaration under Section 564(b)(1) of the Act, 21 U.S.C.section 360bbb-3(b)(1), unless the authorization is terminated  or revoked sooner.       Influenza A by PCR 03/17/2021 NEGATIVE  NEGATIVE Final   Influenza B by PCR 03/17/2021 NEGATIVE  NEGATIVE Final   Comment: (NOTE) The Xpert Xpress SARS-CoV-2/FLU/RSV plus assay is intended as an aid in the diagnosis of influenza from Nasopharyngeal swab specimens and should not be used as a sole basis for treatment. Nasal washings and aspirates are unacceptable for Xpert Xpress SARS-CoV-2/FLU/RSV testing.  Fact Sheet for Patients: BloggerCourse.com  Fact Sheet for Healthcare Providers: SeriousBroker.it  This test is not yet approved or cleared by the [place] FDA and has been authorized for detection and/or diagnosis of SARS-CoV-2 by FDA under an Emergency Use Authorization (EUA). This EUA will remain in effect (meaning this test can be used) for the duration of the COVID-19 declaration under Section 564(b)(1) of the Act, 21 U.S.C. section 360bbb-3(b)(1), unless the authorization is terminated or revoked.  Performed at [facility], [address], [place], [place] [address]     Allergies: Patient has no known allergies.  PTA Medications: (Not in a hospital admission)   Medical Decision Making  Patient is a 40 year old male with past psychiatric history significant for schizoaffective disorder, bipolar type and past medical history  significant for essential hypertension, who presents to the [facility] behavioral health urgent care [facility]) via law enforcement under IVC (see HPI/IVC paperowrk for further details if necessary). Based on patient's current presentation, patient's IVC paperwork, and inability to obtain collateral information from patient's mother/IVC petitioner at this time, believe that the patient may be a potential threat to himself and others at this time and thus recommend continuous assessment for the patient at this time pending further reevaluation by psychiatry and attainment of collateral information on 05/10/2021.    Recommendations  Based on my evaluation the patient does not appear to have an emergency medical condition.  Patient will be placed in the continuous assessment area for further stabilization and treatment.  Patient will be reevaluated by the treatment team on 05/10/2021 and disposition to be determined at that time.  First exam for patient's IVC paperwork to be completed at that time of 05/10/2021 reevaluation as well.  Labs/tests ordered and reviewed:  -UDS: Patient unable to provide a urine specimen at this time.  Recommend that patient be encouraged to provide urine specimen for this lab during the day on 05/10/2021.  -PCR Flu A&B, COVID: Results pending  -CBC with differential: Collected, results pending  -CMP: Collected, results pending  -Valproic acid level ordered due to patient's questionable medication compliance with his medications including Depakote.  Collected, results pending  -Patient had antipsychotic labs of hemoglobin A1c, lipid panel, prolactin, and TSH done on 03/17/2021.  Due to these labs being done recently, these labs do not need to be repeated at this time.  03/17/2021 hemoglobin A1c within normal limits at 5.5%.  03/17/2021 lipid panel shows  slight elevation of LDL cholesterol at 102 mg/dL which is essentially normal.  03/17/2021 lipid panel otherwise unremarkable.   03/17/2021 prolactin level elevated at 33.3 ng/mL.  03/17/2021 TSH within normal limits at 1.898 uIU/mL.   -Patient had EKG done on 04/04/2021 which showed normal sinus rhythm and no acute/concerning findings with QT/QTC of 374/395 ms.  Due to patient recently had an EKG done, patient does not need a repeat EKG at this time.  Patient's QT/QTC appropriate for continuation of antipsychotic medications at this time.  Patient is a questionable historian regarding his home medications and has questionable medication compliance despite him saying that he has been taking his home medications regularly (see HPI for details). Will restart the following home medications:  -Cogentin 0.5 mg p.o. twice daily for extrapyramidal reaction caused by medications  -Haldol 5 mg p.o. daily at 6:00 AM for schizoaffective disorder/psychosis  -Haldol 10 mg p.o. daily at bedtime for schizoaffective disorder/psychosis  -Hydrochlorothiazide 12.5 mg p.o. daily for hypertension  -Lisinopril 20 mg p.o. daily for hypertension  -Due to patient's questionable medication compliance with taking his prescribed Depakote ER 1000 mg p.o. daily and due to uncertainty regarding when patient last took this medication, will restart patient's Depakote ER at reduced dosage of 500 mg p.o. daily at bedtime at this time.  Patient is agreeable to this plan.  Recommend that patient's Depakote dosage be further adjusted as needed depending on patient's valproic acid level results.  Vistaril 25 mg p.o. 3 times daily as needed ordered for anxiety.  Trazodone 50 mg p.o. at bedtime as needed ordered for sleep.  Patient educated on side effect profiles of Vistaril and trazodone.  Jaclyn Shaggy, PA-C 05/10/21  4:08 AM

## 2021-05-10 NOTE — ED Notes (Signed)
Pt was unable to give urine at the time for the drug screening, a cup has been provided for patient to provide specimen the next time he has to go to the restroom
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
## 2021-05-10 NOTE — Discharge Summary (Signed)
Patient was discharged to a shelter. He was given AVS paperwork, drug info and resources were reviewed with patient. Patient expressed understanding of information. He was discharged and given a bus pass.

## 2021-05-10 NOTE — ED Notes (Signed)
Pt received breakfast 

## 2021-05-30 ENCOUNTER — Other Ambulatory Visit: Payer: Self-pay

## 2021-05-31 ENCOUNTER — Telehealth (HOSPITAL_COMMUNITY): Payer: Self-pay | Admitting: *Deleted

## 2021-05-31 NOTE — Telephone Encounter (Signed)
VM from patient asking me to call him back "ASAP". As he was told he cant get his medicine at [facility]. Reviewed his record and we have never seen him at this clinic. Most recent [facility] contact was at [facility] hospital and D/C in Aug. He was referred here for follow up but I noticed he has Medicare only for insurance which we dont take. Called him back and referred him on his VM to call back to our front desk and they could help him with what he needs to do next.

## 2021-06-02 ENCOUNTER — Other Ambulatory Visit (HOSPITAL_COMMUNITY): Payer: Self-pay | Admitting: *Deleted

## 2021-06-02 ENCOUNTER — Telehealth (HOSPITAL_COMMUNITY): Payer: Self-pay | Admitting: *Deleted

## 2021-06-02 MED ORDER — HALOPERIDOL 10 MG PO TABS: 10.0000 mg | ORAL_TABLET | Freq: Every day | ORAL | 0 refills | Status: DC

## 2021-06-02 NOTE — Telephone Encounter (Signed)
Call from patient, his Haldol rx was never called into the pharmacy. He wants it to go to [place]. Called a new order in for him to [place].

## 2021-06-03 ENCOUNTER — Other Ambulatory Visit: Payer: Self-pay

## 2021-06-03 ENCOUNTER — Encounter (HOSPITAL_COMMUNITY): Payer: Self-pay | Admitting: Physician Assistant

## 2021-06-03 ENCOUNTER — Ambulatory Visit (INDEPENDENT_AMBULATORY_CARE_PROVIDER_SITE_OTHER): Payer: Medicare Other | Admitting: Physician Assistant

## 2021-06-03 DIAGNOSIS — F250 Schizoaffective disorder, bipolar type: Secondary | ICD-10-CM

## 2021-06-03 MED ORDER — DIVALPROEX SODIUM ER 500 MG PO TB24
500.0000 mg | ORAL_TABLET | Freq: Every day | ORAL | 1 refills | Status: DC
Start: 2021-06-03 — End: 2021-07-29
  Filled 2021-06-03: qty 30, 30d supply, fill #0
  Filled 2021-07-04: qty 30, 30d supply, fill #1

## 2021-06-03 MED ORDER — BENZTROPINE MESYLATE 0.5 MG PO TABS
0.5000 mg | ORAL_TABLET | Freq: Two times a day (BID) | ORAL | 1 refills | Status: DC
Start: 2021-06-03 — End: 2021-07-29
  Filled 2021-06-03: qty 60, 30d supply, fill #0

## 2021-06-03 MED ORDER — HALOPERIDOL 10 MG PO TABS
10.0000 mg | ORAL_TABLET | Freq: Every day | ORAL | 1 refills | Status: DC
Start: 2021-06-03 — End: 2021-07-29
  Filled 2021-06-03: qty 30, 30d supply, fill #0
  Filled 2021-07-04: qty 30, 30d supply, fill #1
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
## 2021-06-13 ENCOUNTER — Ambulatory Visit (HOSPITAL_COMMUNITY): Payer: Medicare Other | Admitting: Physician Assistant

## 2021-07-04 ENCOUNTER — Other Ambulatory Visit: Payer: Self-pay

## 2021-07-08 ENCOUNTER — Other Ambulatory Visit: Payer: Self-pay

## 2021-07-29 ENCOUNTER — Other Ambulatory Visit: Payer: Self-pay

## 2021-07-29 ENCOUNTER — Ambulatory Visit (INDEPENDENT_AMBULATORY_CARE_PROVIDER_SITE_OTHER): Payer: Medicare Other | Admitting: Physician Assistant

## 2021-07-29 ENCOUNTER — Encounter (HOSPITAL_COMMUNITY): Payer: Self-pay | Admitting: Physician Assistant

## 2021-07-29 VITALS — BP 168/123 | HR 86 | Ht 68.0 in | Wt 212.0 lb

## 2021-07-29 DIAGNOSIS — F250 Schizoaffective disorder, bipolar type: Secondary | ICD-10-CM

## 2021-07-29 MED ORDER — BENZTROPINE MESYLATE 0.5 MG PO TABS
0.5000 mg | ORAL_TABLET | Freq: Two times a day (BID) | ORAL | 1 refills | Status: DC
Start: 2021-07-29 — End: 2021-09-20
  Filled 2021-07-29: qty 60, 30d supply, fill #0

## 2021-07-29 MED ORDER — HALOPERIDOL 10 MG PO TABS
10.0000 mg | ORAL_TABLET | Freq: Every day | ORAL | 1 refills | Status: DC
Start: 2021-07-29 — End: 2021-09-20
  Filled 2021-07-29: qty 30, 30d supply, fill #0

## 2021-07-29 MED ORDER — DIVALPROEX SODIUM ER 500 MG PO TB24
500.0000 mg | ORAL_TABLET | Freq: Every day | ORAL | 1 refills | Status: DC
Start: 2021-07-29 — End: 2021-10-07
  Filled 2021-07-29: qty 30, 30d supply, fill #0

## 2021-07-29 NOTE — Progress Notes (Addendum)
BH MD/PA/NP OP Progress Note  07/29/2021 4:27 PM Steven Clayton  MRN:  [id number]  Chief Complaint:  Chief Complaint   Medication Management    HPI:   Steven Clayton is a 40 year old male with a past psychiatric history significant for schizoaffective disorder (bipolar type) who presents to [facility] for follow-up and medication management.  Patient is currently being managed on the following medication:  Haldol 10 mg at bedtime Depakote 500 mg at bedtime Benztropine 0.5 mg 2 times daily  Patient reports no issues or concerns regarding his current medication regimen.  Patient denies the need for dosage adjustments at this time and is requesting refills on all of his medications following the conclusion of the encounter.  She denies depressive symptoms or anxiety.  She further denies any new stressors at this time and states that he has no other issues regarding his mental health.  A PHQ-9 screen was performed with the patient scoring a 14.  A GAD-7 screen was also performed with the patient scoring a 9.  Patient is alert and oriented x4, pleasant, calm, cooperative, and fully engaged in conversation during the encounter.  Patient endorses excellent mood.  Patient denies suicidal or homicidal ideation.  He further denies auditory or visual hallucinations and does not appear to be responding to internal/external stimuli.  Patient endorses good sleep and receives on average 7 to 8 hours of sleep each night.  Patient endorses good appetite and eats on average 3 meals per day.  Patient denies alcohol consumption and illicit drug use.  Patient endorses tobacco use and smokes on average 3 to 4 cigarettes/day  Visit Diagnosis:    ICD-10-CM   1. Schizoaffective disorder, bipolar type (HCC)  F25.0 benztropine (COGENTIN) 0.5 MG tablet    haloperidol (HALDOL) 10 MG tablet    divalproex (DEPAKOTE ER) 500 MG 24 hr tablet      Past Psychiatric History:   Schizoaffective disorder (bipolar type)  Past Medical History:  Past Medical History:  Diagnosis Date   Anxiety    Depression    Schizoaffective disorder (HCC)    Stroke (HCC)    No past surgical history on file.  Family Psychiatric History:  Patient denies any specific family history of psychiatric  illness. He does report that his father suffered traumatic brain injury while being in the [employer].  Family History:  Family History  Problem Relation Age of Onset   Heart disease Mother    Hyperlipidemia Mother    Mental illness Father     Social History:  Social History   Socioeconomic History   Marital status: Single    Spouse name: Not on file   Number of children: Not on file   Years of education: Not on file   Highest education level: Not on file  Occupational History   Not on file  Tobacco Use   Smoking status: Some Days    Packs/day: 2.00    Types: Cigarettes   Smokeless tobacco: Never  Vaping Use   Vaping Use: Never used  Substance and Sexual Activity   Alcohol use: Yes    Alcohol/week: 1.0 standard drink    Types: 1 Glasses of wine per week   Drug use: No   Sexual activity: Never  Other Topics Concern   Not on file  Social History Narrative   Not on file   Social Determinants of Health   Financial Resource Strain: Not on file  Food Insecurity: Not on file  Transportation  Needs: Not on file  Physical Activity: Not on file  Stress: Not on file  Social Connections: Not on file    Allergies: No Known Allergies  Metabolic Disorder Labs: Lab Results  Component Value Date   HGBA1C 5.5 03/17/2021   MPG 111 03/17/2021   MPG 114 01/20/2015   Lab Results  Component Value Date   PROLACTIN 33.3 (H) 03/17/2021   PROLACTIN 26.5 (H) 01/20/2015   Lab Results  Component Value Date   CHOL 182 03/17/2021   TRIG 118 03/17/2021   HDL 56 03/17/2021   CHOLHDL 3.3 03/17/2021   VLDL 24 03/17/2021   LDLCALC 102 (H) 03/17/2021   LDLCALC 115 (H)  12/24/2017   Lab Results  Component Value Date   TSH 1.898 03/17/2021   TSH 1.896 01/19/2015    Therapeutic Level Labs: No results found for: LITHIUM Lab Results  Component Value Date   VALPROATE 46 (L) 05/10/2021   VALPROATE 70 03/23/2021   No components found for:  CBMZ  Current Medications: Current Outpatient Medications  Medication Sig Dispense Refill   benztropine (COGENTIN) 0.5 MG tablet Take 1 tablet (0.5 mg total) by mouth 2 (two) times daily. 60 tablet 1   divalproex (DEPAKOTE ER) 500 MG 24 hr tablet Take 1 tablet (500 mg total) by mouth at bedtime. 30 tablet 1   haloperidol (HALDOL) 10 MG tablet Take 1 tablet (10 mg total) by mouth at bedtime. 30 tablet 1   hydrochlorothiazide (MICROZIDE) 12.5 MG capsule Take 1 capsule (12.5 mg total) by mouth daily. 30 capsule 0   lisinopril (ZESTRIL) 20 MG tablet Take 1 tablet (20 mg total) by mouth daily. 30 tablet 0   No current facility-administered medications for this visit.     Musculoskeletal: Strength & Muscle Tone: within normal limits Gait & Station: normal Patient leans: N/A  Psychiatric Specialty Exam: Review of Systems  Psychiatric/Behavioral:  Negative for decreased concentration, dysphoric mood, hallucinations, self-injury, sleep disturbance and suicidal ideas. The patient is nervous/anxious. The patient is not hyperactive.    Blood pressure (!) 168/123, pulse 86, height 5\' 8"  (1.727 m), weight 212 lb (96.2 kg), SpO2 99 %.Body mass index is 32.23 kg/m.  General Appearance: Well Groomed  Eye Contact:  Good  Speech:  Clear and Coherent and Normal Rate  Volume:  Normal  Mood:  Anxious and Euthymic  Affect:  Appropriate and Congruent  Thought Process:  Coherent, Goal Directed, and Descriptions of Associations: Intact  Orientation:  Full (Time, Place, and Person)  Thought Content: WDL   Suicidal Thoughts:  No  Homicidal Thoughts:  No  Memory:  Immediate;   Good Recent;   Good Remote;   Good  Judgement:  Good   Insight:  Fair  Psychomotor Activity:  Normal  Concentration:  Concentration: Good and Attention Span: Fair  Recall:  Good  Fund of Knowledge: Good  Language: Good  Akathisia:  NA  Handed:  Right  AIMS (if indicated): not done  Assets:  Communication Skills Desire for Improvement Social Support  ADL's:  Intact  Cognition: WNL  Sleep:  Good   Screenings: AIMS    Flowsheet Row [facility] Ad[facility]  AIMS Total Score 0 0      AUDIT    Flowsheet Row [facility]  Alcohol Use Disorder Identification Test Final Score (AUDIT) 2  GAD-7    Flowsheet Row Office Visit from 07/29/2021 in [facility] Office Visit from 06/03/2021 in [facility]  Total GAD-7 Score 9 15      PHQ2-9    Flowsheet Row Office Visit from 07/29/2021 in [facility] Office Visit from 06/03/2021 in [facility] Office Visit from 12/24/2017 in Primary Care at [facility] Total Score 2 1 1   PHQ-9 Total Score 14 -- --      Flowsheet Row Office Visit from 07/29/2021 in [facility] Office Visit from 06/03/2021 in [facility] ED from 05/10/2021 in [facility]  C-SSRS RISK CATEGORY No Risk Low Risk No Risk        Assessment and Plan:   Steven Clayton is a 40 year old male with a past psychiatric history significant for schizoaffective disorder (bipolar type) who presents to [facility] for follow-up and medication management.  Patient reports no issues or concerns regarding his current medication regimen.  Patient denies depressive symptoms or  anxiety and appears to be stable while on his medications.  Patient's medications to be e-prescribed to pharmacy of choice.  1. Schizoaffective disorder, bipolar type (HCC)  - benztropine (COGENTIN) 0.5 MG tablet; Take 1 tablet (0.5 mg total) by mouth 2 (two) times daily.  Dispense: 60 tablet; Refill: 1 - haloperidol (HALDOL) 10 MG tablet; Take 1 tablet (10 mg total) by mouth at bedtime.  Dispense: 30 tablet; Refill: 1 - divalproex (DEPAKOTE ER) 500 MG 24 hr tablet; Take 1 tablet (500 mg total) by mouth at bedtime.  Dispense: 30 tablet; Refill: 1  Patient to follow up in 2 months Provider spent a total of 26 minutes with the patient/reviewing patient's chart  [facility], PA 07/29/2021, 4:27 PM

## 2021-08-02 ENCOUNTER — Other Ambulatory Visit: Payer: Self-pay

## 2021-08-08 ENCOUNTER — Other Ambulatory Visit: Payer: Self-pay

## 2021-09-20 ENCOUNTER — Other Ambulatory Visit (HOSPITAL_COMMUNITY): Payer: Self-pay | Admitting: Family

## 2021-09-20 DIAGNOSIS — F250 Schizoaffective disorder, bipolar type: Secondary | ICD-10-CM

## 2021-09-20 MED ORDER — BENZTROPINE MESYLATE 0.5 MG PO TABS: 0.5000 mg | ORAL_TABLET | Freq: Two times a day (BID) | ORAL | 0 refills | Status: DC

## 2021-09-20 MED ORDER — HALOPERIDOL 10 MG PO TABS: 10.0000 mg | ORAL_TABLET | Freq: Every day | ORAL | 0 refills | Status: DC

## 2021-09-20 NOTE — Telephone Encounter (Cosign Needed Addendum)
Per patient request, patient refills provided for Haloperidol 10mg  QHS and benztropine 0.5 mg BID. Patient refills provided for 30 days. Upcoming appointment scheduled for 10/07/2021 at [facility].  Patient reviewed with Dr Hampton Abbot.

## 2021-10-07 ENCOUNTER — Other Ambulatory Visit: Payer: Self-pay

## 2021-10-07 ENCOUNTER — Encounter (HOSPITAL_COMMUNITY): Payer: Self-pay | Admitting: Physician Assistant

## 2021-10-07 ENCOUNTER — Ambulatory Visit (INDEPENDENT_AMBULATORY_CARE_PROVIDER_SITE_OTHER): Payer: Medicare Other | Admitting: Physician Assistant

## 2021-10-07 DIAGNOSIS — F250 Schizoaffective disorder, bipolar type: Secondary | ICD-10-CM

## 2021-10-07 MED ORDER — DIVALPROEX SODIUM ER 500 MG PO TB24
500.0000 mg | ORAL_TABLET | Freq: Every day | ORAL | 1 refills | Status: DC
  Filled 2021-10-07: qty 30, 30d supply, fill #0

## 2021-10-07 MED ORDER — HALOPERIDOL 10 MG PO TABS
10.0000 mg | ORAL_TABLET | Freq: Every day | ORAL | 0 refills | Status: DC
  Filled 2021-10-07: qty 30, 30d supply, fill #0

## 2021-10-07 MED ORDER — BENZTROPINE MESYLATE 0.5 MG PO TABS
0.5000 mg | ORAL_TABLET | Freq: Two times a day (BID) | ORAL | 0 refills | Status: DC
  Filled 2021-10-07: qty 60, 30d supply, fill #0

## 2021-10-07 NOTE — Progress Notes (Signed)
BH MD/PA/NP OP Progress Note  10/07/2021 2:34 PM [name]  MRN:  [id number]  Chief Complaint:  Chief Complaint  Patient presents with   Medication Management   HPI:   Steven Clayton is a 41 year old male with a past psychiatric history significant for schizoaffective disorder (bipolar type) who presents to [facility] for follow-up and medication management.  Patient is currently being managed on the following medications:  Benztropine 0.5 mg 2 times daily Haloperidol 10 mg at bedtime Depakote 500 mg 24-hour tablet at bedtime  Patient reports no issues or concerns regarding his current medication regimen.  Patient denies the need for dosage adjustments at this time and is requesting refills on all of his medications following the conclusion of the encounter.  Patient denies depressive symptoms nor does he endorse anxiety.  The only stressor that the patient endorses at the moment is that he recently moved into a new home and is adjusting to the new environment.  Patient denies any other issues regarding his mental health.  A PHQ-9 screen was performed with the patient scoring a 14.  A GAD-7 screen was also performed with the patient scoring a 9.  Patient is alert and oriented x4, calm, cooperative, and fully engaged in conversation during the encounter.  Patient endorses fine mood.  Patient denies suicidal or homicidal ideations.  He further denies auditory or visual hallucinations and does not appear to be responding to internal/external stimuli.  Patient states that he gets enough sleep and receives roughly 8 hours of sleep each night.  Patient states that his sleep is sometimes disturbed by excessive worrying.  Patient endorses good appetite.  Patient denies alcohol consumption and illicit drug use.  Patient endorses tobacco use and smokes on average 3 cigarettes/day.  Visit Diagnosis: No diagnosis found.  Past Psychiatric History:   Schizoaffective disorder (bipolar type)  Past Medical History:  Past Medical History:  Diagnosis Date   Anxiety    Depression    Schizoaffective disorder ([place])    Stroke ([place])    No past surgical history on file.  Family Psychiatric History:  Patient denies any specific family history of psychiatric  illness. He does report that his father suffered traumatic brain injury while being in the [employer].  Family History:  Family History  Problem Relation Age of Onset   Heart disease Mother    Hyperlipidemia Mother    Mental illness Father     Social History:  Social History   Socioeconomic History   Marital status: Single    Spouse name: Not on file   Number of children: Not on file   Years of education: Not on file   Highest education level: Not on file  Occupational History   Not on file  Tobacco Use   Smoking status: Some Days    Packs/day: 2.00    Types: Cigarettes   Smokeless tobacco: Never  Vaping Use   Vaping Use: Never used  Substance and Sexual Activity   Alcohol use: Yes    Alcohol/week: 1.0 standard drink    Types: 1 Glasses of wine per week   Drug use: No   Sexual activity: Never  Other Topics Concern   Not on file  Social History Narrative   Not on file   Social Determinants of Health   Financial Resource Strain: Not on file  Food Insecurity: Not on file  Transportation Needs: Not on file  Physical Activity: Not on file  Stress: Not on file  Social Connections: Not on file    Allergies: No Known Allergies  Metabolic Disorder Labs: Lab Results  Component Value Date   HGBA1C 5.5 03/17/2021   MPG 111 03/17/2021   MPG 114 01/20/2015   Lab Results  Component Value Date   PROLACTIN 33.3 (H) 03/17/2021   PROLACTIN 26.5 (H) 01/20/2015   Lab Results  Component Value Date   CHOL 182 03/17/2021   TRIG 118 03/17/2021   HDL 56 03/17/2021   CHOLHDL 3.3 03/17/2021   VLDL 24 03/17/2021   LDLCALC 102 (H) 03/17/2021   LDLCALC 115 (H)  12/24/2017   Lab Results  Component Value Date   TSH 1.898 03/17/2021   TSH 1.896 01/19/2015    Therapeutic Level Labs: No results found for: LITHIUM Lab Results  Component Value Date   VALPROATE 46 (L) 05/10/2021   VALPROATE 70 03/23/2021   No components found for:  CBMZ  Current Medications: Current Outpatient Medications  Medication Sig Dispense Refill   benztropine (COGENTIN) 0.5 MG tablet Take 1 tablet (0.5 mg total) by mouth 2 (two) times daily. 60 tablet 0   divalproex (DEPAKOTE ER) 500 MG 24 hr tablet Take 1 tablet (500 mg total) by mouth at bedtime. 30 tablet 1   haloperidol (HALDOL) 10 MG tablet Take 1 tablet (10 mg total) by mouth at bedtime. 30 tablet 0   hydrochlorothiazide (MICROZIDE) 12.5 MG capsule Take 1 capsule (12.5 mg total) by mouth daily. 30 capsule 0   lisinopril (ZESTRIL) 20 MG tablet Take 1 tablet (20 mg total) by mouth daily. 30 tablet 0   No current facility-administered medications for this visit.     Musculoskeletal: Strength & Muscle Tone: within normal limits Gait & Station: normal Patient leans: N/A  Psychiatric Specialty Exam: Review of Systems  Psychiatric/Behavioral:  Negative for decreased concentration, dysphoric mood, hallucinations, self-injury, sleep disturbance and suicidal ideas. The patient is not nervous/anxious and is not hyperactive.    Blood pressure (!) 165/131, pulse 79, height 5\' 8"  (1.727 m), weight 226 lb (102.5 kg), SpO2 100 %.Body mass index is 34.36 kg/m.  General Appearance: Casual  Eye Contact:  Good  Speech:  Clear and Coherent  Volume:  Normal  Mood:  Euthymic  Affect:  Appropriate  Thought Process:  Coherent and Descriptions of Associations: Intact  Orientation:  Full (Time, Place, and Person)  Thought Content: WDL   Suicidal Thoughts:  No  Homicidal Thoughts:  No  Memory:  Immediate;   Good Recent;   Good Remote;   Good  Judgement:  Good  Insight:  Fair  Psychomotor Activity:  Normal  Concentration:   Concentration: Good and Attention Span: Fair  Recall:  Good  Fund of Knowledge: Good  Language: Good  Akathisia:  No  Handed:  Right  AIMS (if indicated): not done  Assets:  Communication Skills Desire for Improvement Social Support  ADL's:  Intact  Cognition: WNL  Sleep:  Good   Screenings: AIMS    Flowsheet Row Admission (Discharged) from 03/17/2021 in [place] [facility] Admission (Discharged) from 01/18/2015 in [place] [facility]  AIMS Total Score 0 0      AUDIT    Flowsheet Row Admission (Discharged) from 01/18/2015 in [place] [facility]  Alcohol Use Disorder Identification Test Final Score (AUDIT) 2      GAD-7    Flowsheet Row Office Visit from 07/29/2021 in [facility] Office Visit from 06/03/2021 in [place]  [place]  Total GAD-7 Score 9 15      PHQ2-9    [place] Office Visit from 07/29/2021 in [facility] Office Visit from 06/03/2021 in [facility] Office Visit from 12/24/2017 in Primary Care at [facility] Total Score 2 1 1   PHQ-9 Total Score 14 -- --      [place] Office Visit from 07/29/2021 in [facility] Office Visit from 06/03/2021 in [facility] ED from 05/10/2021 in [place] No Risk Low Risk No Risk        Assessment and Plan:   Jayvonte Kint is a 41 year old male with a past psychiatric history significant for schizoaffective disorder (bipolar type) who presents to [facility] Outpatient clinic for follow-up and medication management.  Patient reports no issues or concerns regarding his current medication regimen.  Patient denies the need for dosage adjustments at this time and is requesting refills on all of his  medications following the conclusion of the encounter.  Patient appears to be stable on his current regimen of medications.  Patient's medications to be e-prescribed to pharmacy of choice.  Collaboration of Care: Collaboration of Care: Medication Management AEB this provider managing patient's medications and Psychiatrist AEB patient being managed by a psychiatric provider  Patient/Guardian was advised Release of Information must be obtained prior to any record release in order to collaborate their care with an outside provider. Patient/Guardian was advised if they have not already done so to contact the registration department to sign all necessary forms in order for [place] to release information regarding their care.   Consent: Patient/Guardian gives verbal consent for treatment and assignment of benefits for services provided during this visit. Patient/Guardian expressed understanding and agreed to proceed.   1. Schizoaffective disorder, bipolar type ([place])  - divalproex (DEPAKOTE ER) 500 MG 24 hr tablet; Take 1 tablet (500 mg total) by mouth at bedtime.  Dispense: 30 tablet; Refill: 1 - haloperidol (HALDOL) 10 MG tablet; Take 1 tablet (10 mg total) by mouth at bedtime.  Dispense: 30 tablet; Refill: 0 - benztropine (COGENTIN) 0.5 MG tablet; Take 1 tablet (0.5 mg total) by mouth 2 (two) times daily.  Dispense: 60 tablet; Refill: 0 - Valproic Acid level; Future  Patient to follow up in 2 months Provider spent a total of 12 minutes with the patient/reviewing patient's chart  Malachy Mood, PA 10/07/2021, 2:34 PM

## 2021-11-25 ENCOUNTER — Other Ambulatory Visit (HOSPITAL_COMMUNITY): Payer: Self-pay

## 2021-12-02 ENCOUNTER — Encounter (HOSPITAL_COMMUNITY): Payer: Self-pay | Admitting: Physician Assistant

## 2021-12-02 ENCOUNTER — Other Ambulatory Visit: Payer: Self-pay

## 2021-12-02 ENCOUNTER — Ambulatory Visit (INDEPENDENT_AMBULATORY_CARE_PROVIDER_SITE_OTHER): Payer: Medicare Other | Admitting: Physician Assistant

## 2021-12-02 DIAGNOSIS — F250 Schizoaffective disorder, bipolar type: Secondary | ICD-10-CM | POA: Diagnosis not present

## 2021-12-02 MED ORDER — BENZTROPINE MESYLATE 0.5 MG PO TABS
0.5000 mg | ORAL_TABLET | Freq: Two times a day (BID) | ORAL | 0 refills | Status: DC
  Filled 2021-12-02: qty 60, 30d supply, fill #0

## 2021-12-02 MED ORDER — HALOPERIDOL 10 MG PO TABS
10.0000 mg | ORAL_TABLET | Freq: Every day | ORAL | 1 refills | Status: DC
Start: 2021-12-02 — End: 2022-02-03
  Filled 2021-12-02: qty 30, 30d supply, fill #0

## 2021-12-02 MED ORDER — DIVALPROEX SODIUM ER 500 MG PO TB24
500.0000 mg | ORAL_TABLET | Freq: Every day | ORAL | 0 refills | Status: DC
Start: 2021-12-02 — End: 2022-02-03
  Filled 2021-12-02: qty 30, 30d supply, fill #0

## 2021-12-02 NOTE — Progress Notes (Signed)
BH MD/PA/NP OP Progress Note ? ?12/02/2021 11:23 PM ?Steven Clayton  ?MRN:  [id number] ? ?Chief Complaint:  ?Chief Complaint  ?Patient presents with  ? Medication Management  ?  F/U ?  ? ?HPI:  ? ?Steven Clayton is a 41 year old male with a past psychiatric history significant for schizoaffective disorder (bipolar type) who presents to [facility].  Patient is currently being managed on the following medications: ? ?Divalproex (Depakote ER) 500 mg 24-hour tablet bedtime ?Haldol 10 mg at bedtime ?Cogentin 0.5 mg 2 times daily ? ?Prior to beginning the encounter, nurse informed provider that the patient's blood pressure was elevated.  Patient denies having a primary care provider at this time.  Provider informed patient that he would be referred out to an outpatient clinic so that his blood pressure can be properly managed.  Patient vocalized understanding.  Patient reports no issues or concerns regarding his current medication regimen.  Patient denies the need for dosage adjustments at this time and reports that he is taking his medications as prescribed.  Patient denies depressive symptoms and states that his anxiety is manageable.  He does express that he is experiencing stress related to moving into his new apartment space.  Patient also notes that he will be attending a funeral for a family member that passed away. ? ?During the encounter the encounter, patient went on a long rant regarding things he is stressed about.  During his discussion, patient exhibited tangential thought often connecting several different ideas that seemingly had no relation to one another.  Provider had to redirect the patient a few times in order for patient to remain on topic.  A PHQ-9 screen was performed with the patient scoring a 13.  A GAD-7 screen was also performed with the patient scoring a 13. ? ?Patient is alert and oriented x4, calm, cooperative, and fully engaged in conversation during  the encounter.  Patient endorses good mood.  Patient denies suicidal or homicidal ideations.  He further denies auditory or visual hallucinations and does not appear to be responding to internal/external stimuli.  Patient endorses good sleep.  Patient endorses good appetite and states that he is trying to eat more healthy to help manage his blood pressure.  Patient endorses alcohol consumption sparingly.  Patient endorses tobacco use.  Patient further denies illicit drug use. ? ?Visit Diagnosis:  ?  ICD-10-CM   ?1. Schizoaffective disorder, bipolar type (HCC)  F25.0 benztropine (COGENTIN) 0.5 MG tablet  ?  haloperidol (HALDOL) 10 MG tablet  ?  divalproex (DEPAKOTE ER) 500 MG 24 hr tablet  ?  ? ? ?Past Psychiatric History:  ?Schizoaffective disorder (bipolar type) ? ?Past Medical History:  ?Past Medical History:  ?Diagnosis Date  ? Anxiety   ? Depression   ? Schizoaffective disorder (HCC)   ? Stroke [facility])   ? No past surgical history on file. ? ?Family Psychiatric History:  ?Patient denies any specific family history of psychiatric  illness. He does report that his father suffered traumatic brain injury while being in the [employer]. ? ?Family History:  ?Family History  ?Problem Relation Age of Onset  ? Heart disease Mother   ? Hyperlipidemia Mother   ? Mental illness Father   ? ? ?Social History:  ?Social History  ? ?Socioeconomic History  ? Marital status: Single  ?  Spouse name: Not on file  ? Number of children: Not on file  ? Years of education: Not on file  ? Highest education level:  Not on file  ?Occupational History  ? Not on file  ?Tobacco Use  ? Smoking status: Some Days  ?  Packs/day: 2.00  ?  Types: Cigarettes  ? Smokeless tobacco: Never  ?Vaping Use  ? Vaping Use: Never used  ?Substance and Sexual Activity  ? Alcohol use: Yes  ?  Alcohol/week: 1.0 standard drink  ?  Types: 1 Glasses of wine per week  ? Drug use: No  ? Sexual activity: Never  ?Other Topics Concern  ? Not on file  ?Social History Narrative   ? Not on file  ? ?Social Determinants of Health  ? ?Financial Resource Strain: Not on file  ?Food Insecurity: Not on file  ?Transportation Needs: Not on file  ?Physical Activity: Not on file  ?Stress: Not on file  ?Social Connections: Not on file  ? ? ?Allergies: No Known Allergies ? ?Metabolic Disorder Labs: ?Lab Results  ?Component Value Date  ? HGBA1C 5.5 03/17/2021  ? MPG 111 03/17/2021  ? MPG 114 01/20/2015  ? ?Lab Results  ?Component Value Date  ? PROLACTIN 33.3 (H) 03/17/2021  ? PROLACTIN 26.5 (H) 01/20/2015  ? ?Lab Results  ?Component Value Date  ? CHOL 182 03/17/2021  ? TRIG 118 03/17/2021  ? HDL 56 03/17/2021  ? CHOLHDL 3.3 03/17/2021  ? VLDL 24 03/17/2021  ? LDLCALC 102 (H) 03/17/2021  ? LDLCALC 115 (H) 12/24/2017  ? ?Lab Results  ?Component Value Date  ? TSH 1.898 03/17/2021  ? TSH 1.896 01/19/2015  ? ? ?Therapeutic Level Labs: ?No results found for: LITHIUM ?Lab Results  ?Component Value Date  ? VALPROATE 46 (L) 05/10/2021  ? VALPROATE 70 03/23/2021  ? ?No components found for:  CBMZ ? ?Current Medications: ?Current Outpatient Medications  ?Medication Sig Dispense Refill  ? benztropine (COGENTIN) 0.5 MG tablet Take 1 tablet (0.5 mg total) by mouth 2 (two) times daily. 60 tablet 0  ? divalproex (DEPAKOTE ER) 500 MG 24 hr tablet Take 1 tablet (500 mg total) by mouth at bedtime. 30 tablet 0  ? haloperidol (HALDOL) 10 MG tablet Take 1 tablet (10 mg total) by mouth at bedtime. 30 tablet 1  ? hydrochlorothiazide (MICROZIDE) 12.5 MG capsule Take 1 capsule (12.5 mg total) by mouth daily. 30 capsule 0  ? lisinopril (ZESTRIL) 20 MG tablet Take 1 tablet (20 mg total) by mouth daily. 30 tablet 0  ? ?No current facility-administered medications for this visit.  ? ? ? ?Musculoskeletal: ?Strength & Muscle Tone: within normal limits ?Gait & Station: normal ?Patient leans: N/A ? ?Psychiatric Specialty Exam: ?Review of Systems  ?Psychiatric/Behavioral:  Positive for confusion and decreased concentration. Negative for  dysphoric mood, hallucinations, self-injury, sleep disturbance and suicidal ideas. The patient is not nervous/anxious and is not hyperactive.    ?Blood pressure (!) 142/106, pulse 83, height 5\' 9"  (1.753 m), weight 227 lb (103 kg).Body mass index is 33.52 kg/m?.  ?General Appearance: Casual  ?Eye Contact:  Good  ?Speech:  Clear and Coherent and Normal Rate  ?Volume:  Normal  ?Mood:  Euthymic  ?Affect:  Appropriate  ?Thought Process:  Coherent and Descriptions of Associations: Intact  ?Orientation:  Full (Time, Place, and Person)  ?Thought Content: WDL   ?Suicidal Thoughts:  No  ?Homicidal Thoughts:  No  ?Memory:  Immediate;   Good ?Recent;   Good ?Remote;   Good  ?Judgement:  Good  ?Insight:  Fair  ?Psychomotor Activity:  Normal  ?Concentration:  Concentration: Good and Attention Span: Fair  ?Recall:  Good  ?Fund of Knowledge: Good  ?Language: Good  ?Akathisia:  No  ?Handed:  Right  ?AIMS (if indicated): not done  ?Assets:  Communication Skills ?Desire for Improvement ?Social Support  ?ADL's:  Intact  ?Cognition: WNL  ?Sleep:  Good  ? ?Screenings: ?AIMS   ? ?Flowsheet Row [facility]  ?AIMS Total Score 0 0  ? ?  ? ?AUDIT   ? ?Flowsheet Row [facility]  ?Alcohol Use Disorder Identification Test Final Score (AUDIT) 2  ? ?  ? ?GAD-7   ? ?Flowsheet Row Clinical Support from 12/02/2021 in [facility] Office Visit from 10/07/2021 in [facility] Office Visit from 07/29/2021 in [facility] Office Visit from 06/03/2021 in [facility]  ?Total GAD-7 Score 13 12 9 15   ? ?  ? ?PHQ2-9   ? ?Flowsheet Row Clinical Support from 12/02/2021 in [facility] Office  Visit from 10/07/2021 in [facility] Office Visit from 07/29/2021 in [facility] Office Visit from 06/03/2021 in [facility]

## 2021-12-05 ENCOUNTER — Other Ambulatory Visit: Payer: Self-pay

## 2021-12-14 ENCOUNTER — Telehealth (HOSPITAL_COMMUNITY): Payer: Self-pay | Admitting: *Deleted

## 2021-12-14 NOTE — Telephone Encounter (Signed)
Fax received from [facility] Team to inform the provider that this patient has been identified by the following high risk screening algorithm and someone that might benefit from special attention. This patient may not have been adherent to their antipsychotic (Haldol) medication meaning a greater than 12 day gap in the supply. ?

## 2022-02-03 ENCOUNTER — Other Ambulatory Visit (HOSPITAL_COMMUNITY): Payer: Self-pay | Admitting: Physician Assistant

## 2022-02-03 ENCOUNTER — Other Ambulatory Visit: Payer: Self-pay

## 2022-02-03 ENCOUNTER — Encounter (HOSPITAL_COMMUNITY): Payer: Medicare Other | Admitting: Physician Assistant

## 2022-02-03 DIAGNOSIS — F250 Schizoaffective disorder, bipolar type: Secondary | ICD-10-CM

## 2022-02-03 MED ORDER — BENZTROPINE MESYLATE 0.5 MG PO TABS
0.5000 mg | ORAL_TABLET | Freq: Two times a day (BID) | ORAL | 0 refills | Status: DC
  Filled 2022-02-03: qty 60, 30d supply, fill #0

## 2022-02-03 MED ORDER — HALOPERIDOL 10 MG PO TABS
10.0000 mg | ORAL_TABLET | Freq: Every day | ORAL | 1 refills | Status: DC
Start: 2022-02-03 — End: 2022-04-07
  Filled 2022-02-03: qty 30, 30d supply, fill #0

## 2022-02-03 MED ORDER — DIVALPROEX SODIUM ER 500 MG PO TB24
500.0000 mg | ORAL_TABLET | Freq: Every day | ORAL | 0 refills | Status: DC
Start: 2022-02-03 — End: 2022-04-07
  Filled 2022-02-03: qty 30, 30d supply, fill #0

## 2022-02-03 NOTE — Progress Notes (Signed)
Patient missed his appointment scheduled for today.  Patient is also currently out of his prescriptions.  Provider to refill patient's prescription.  Patient to have a follow-up appointment scheduled in the near future.

## 2022-04-07 ENCOUNTER — Telehealth (INDEPENDENT_AMBULATORY_CARE_PROVIDER_SITE_OTHER): Payer: Medicare Other | Admitting: Physician Assistant

## 2022-04-07 ENCOUNTER — Encounter (HOSPITAL_COMMUNITY): Payer: Self-pay | Admitting: Physician Assistant

## 2022-04-07 VITALS — BP 179/111 | HR 63 | Ht 69.0 in | Wt 224.0 lb

## 2022-04-07 DIAGNOSIS — F250 Schizoaffective disorder, bipolar type: Secondary | ICD-10-CM | POA: Diagnosis not present

## 2022-04-07 MED ORDER — DIVALPROEX SODIUM ER 500 MG PO TB24
500.0000 mg | ORAL_TABLET | Freq: Every day | ORAL | 2 refills | Status: DC
  Filled 2022-04-07: qty 30, 30d supply, fill #0
  Filled 2022-05-24 – 2022-05-31 (×2): qty 30, 30d supply, fill #1
  Filled 2022-07-08: qty 30, 30d supply, fill #2

## 2022-04-07 MED ORDER — HALOPERIDOL 10 MG PO TABS
10.0000 mg | ORAL_TABLET | Freq: Every day | ORAL | 2 refills | Status: DC
Start: 2022-04-07 — End: 2022-11-20
  Filled 2022-04-07: qty 30, 30d supply, fill #0
  Filled 2022-05-24 – 2022-05-31 (×2): qty 30, 30d supply, fill #1
  Filled 2022-07-08: qty 30, 30d supply, fill #2

## 2022-04-07 MED ORDER — BENZTROPINE MESYLATE 0.5 MG PO TABS
0.5000 mg | ORAL_TABLET | Freq: Two times a day (BID) | ORAL | 2 refills | Status: DC
  Filled 2022-04-07: qty 60, 30d supply, fill #0
  Filled 2022-05-24 – 2022-05-31 (×2): qty 60, 30d supply, fill #1
  Filled 2022-07-08: qty 60, 30d supply, fill #2

## 2022-04-07 NOTE — Progress Notes (Unsigned)
BH MD/PA/NP OP Progress Note  04/07/2022 8:17 PM Steven Clayton  MRN:  [id number]  Chief Complaint:  Chief Complaint  Patient presents with   Medication Management   HPI:   Steven Clayton is a 41 year old male with a past psychiatric history significant for schizoaffective disorder (bipolar type) who presents to [facility].  Patient is currently being managed on the following medications:  Divalproex (Depakote ER) 500 mg 24-hour tablet bedtime Haldol 10 mg at bedtime Cogentin 0.5 mg 2 times daily  Patient reports that his medication use is going denies any issues with.  Patient denies the need for dosage adjustments at this time and is requesting refills on all of his medications following the conclusion of the encounter.  Patient denies experiencing depression or anxiety.  Patient's only stressor today is with th weather.  A PHQ-9 screen was performed with the patient scoring a 12.  A GAD-7 screen was also performed with the patient scoring a 14.  Patient is alert and oriented x4, calm, cooperative, and fully engaged in conversation during the encounter.  Patient endorses fine mood.  Patient denies suicidal or homicidal ideations.  He further denies auditory or visual hallucinations and does not appear to be responding to internal/external stimuli.  Patient also denies bizarre behaviors.  Patient endorses good sleep and receives on average 8 hours of sleep each night.  Patient endorses good appetite and eats on average 2 meals per day.  Patient denies alcohol consumption and illicit drug use.  Patient endorses tobacco use and smokes on average 2 to 3 cigarettes/day.  Visit Diagnosis:    ICD-10-CM   1. Schizoaffective disorder, bipolar type (HCC)  F25.0       Past Psychiatric History:  Schizoaffective disorder (bipolar type)  Past Medical History:  Past Medical History:  Diagnosis Date   Anxiety    Depression    Schizoaffective disorder  (HCC)    Stroke (HCC)    History reviewed. No pertinent surgical history.  Family Psychiatric History:  Patient denies any specific family history of psychiatric  illness. He does report that his father suffered traumatic brain injury while being in the [employer].  Family History:  Family History  Problem Relation Age of Onset   Heart disease Mother    Hyperlipidemia Mother    Mental illness Father     Social History:  Social History   Socioeconomic History   Marital status: Single    Spouse name: Not on file   Number of children: Not on file   Years of education: Not on file   Highest education level: Not on file  Occupational History   Not on file  Tobacco Use   Smoking status: Some Days    Packs/day: 2.00    Types: Cigarettes   Smokeless tobacco: Never  Vaping Use   Vaping Use: Never used  Substance and Sexual Activity   Alcohol use: Yes    Alcohol/week: 1.0 standard drink of alcohol    Types: 1 Glasses of wine per week   Drug use: No   Sexual activity: Never  Other Topics Concern   Not on file  Social History Narrative   Not on file   Social Determinants of Health   Financial Resource Strain: Not on file  Food Insecurity: Not on file  Transportation Needs: Not on file  Physical Activity: Not on file  Stress: Not on file  Social Connections: Not on file    Allergies: No Known Allergies  Metabolic Disorder Labs: Lab Results  Component Value Date   HGBA1C 5.5 03/17/2021   MPG 111 03/17/2021   MPG 114 01/20/2015   Lab Results  Component Value Date   PROLACTIN 33.3 (H) 03/17/2021   PROLACTIN 26.5 (H) 01/20/2015   Lab Results  Component Value Date   CHOL 182 03/17/2021   TRIG 118 03/17/2021   HDL 56 03/17/2021   CHOLHDL 3.3 03/17/2021   VLDL 24 03/17/2021   LDLCALC 102 (H) 03/17/2021   LDLCALC 115 (H) 12/24/2017   Lab Results  Component Value Date   TSH 1.898 03/17/2021   TSH 1.896 01/19/2015    Therapeutic Level Labs: No results found  for: "LITHIUM" Lab Results  Component Value Date   VALPROATE 46 (L) 05/10/2021   VALPROATE 70 03/23/2021   No results found for: "CBMZ"  Current Medications: Current Outpatient Medications  Medication Sig Dispense Refill   benztropine (COGENTIN) 0.5 MG tablet Take 1 tablet (0.5 mg total) by mouth 2 (two) times daily. 60 tablet 0   divalproex (DEPAKOTE ER) 500 MG 24 hr tablet Take 1 tablet (500 mg total) by mouth once nightly at bedtime. 30 tablet 0   haloperidol (HALDOL) 10 MG tablet Take 1 tablet (10 mg total) by mouth once nightly at bedtime. 30 tablet 1   hydrochlorothiazide (MICROZIDE) 12.5 MG capsule Take 1 capsule (12.5 mg total) by mouth daily. 30 capsule 0   lisinopril (ZESTRIL) 20 MG tablet Take 1 tablet (20 mg total) by mouth daily. 30 tablet 0   No current facility-administered medications for this visit.     Musculoskeletal: Strength & Muscle Tone: within normal limits Gait & Station: normal Patient leans: N/A  Psychiatric Specialty Exam: Review of Systems  Psychiatric/Behavioral:  Positive for confusion and decreased concentration. Negative for dysphoric mood, hallucinations, self-injury, sleep disturbance and suicidal ideas. The patient is not nervous/anxious and is not hyperactive.     Blood pressure (!) 179/111, pulse 63, height 5\' 9"  (1.753 m), weight 224 lb (101.6 kg), SpO2 99 %.Body mass index is 33.08 kg/m.  General Appearance: Casual  Eye Contact:  Good  Speech:  Clear and Coherent and Normal Rate  Volume:  Normal  Mood:  Euthymic  Affect:  Appropriate  Thought Process:  Coherent and Descriptions of Associations: Intact  Orientation:  Full (Time, Place, and Person)  Thought Content: WDL   Suicidal Thoughts:  No  Homicidal Thoughts:  No  Memory:  Immediate;   Good Recent;   Good Remote;   Good  Judgement:  Good  Insight:  Fair  Psychomotor Activity:  Normal  Concentration:  Concentration: Good and Attention Span: Fair  Recall:  Good  Fund of  Knowledge: Good  Language: Good  Akathisia:  No  Handed:  Right  AIMS (if indicated): not done  Assets:  Communication Skills Desire for Improvement Social Support  ADL's:  Intact  Cognition: WNL  Sleep:  Good   Screenings: AIMS    Flowsheet Row [facility] Ad[facility] (Dis[facility]) [facility]  AIMS Total Score 0 0      AUDIT    Flowsheet Row [facility]  Alcohol Use Disorder Identification Test Final Score (AUDIT) 2      GAD-7    Flowsheet Row Video Visit from 04/07/2022 in [facility] Office Visit from 12/02/2021 in [facility] Office Visit from 10/07/2021 in [facility] Office Visit from 07/29/2021 in [facility] Office Visit from 06/03/2021 in [facility]  Total GAD-7 Score 14 13 12 9 15       PHQ2-9    Flowsheet Row Video Visit from 04/07/2022 in [facility] Office Visit from 12/02/2021 in [facility] Office Visit from 10/07/2021 in [facility] Office Visit from 07/29/2021 in [facility] Office Visit from 06/03/2021 in [facility]  PHQ-2 Total Score 2 2 1 2 1   PHQ-9 Total Score 12 13 -- 14 --      Flowsheet Row Video Visit from 04/07/2022 in [facility] Office Visit from 12/02/2021 in [facility] Office Visit from 10/07/2021 in [facility]  C-SSRS RISK CATEGORY No Risk No Risk No Risk        Assessment and Plan:   Steven Clayton is a 41 year old male with a past psychiatric history significant for  schizoaffective disorder (bipolar type) who presents to [facility].  Patient reports that he has been taking his medications as prescribed and denies any issues or concerns regarding his current medication regimen.  Patient reports no issues or concerns regarding his current medication regimen.  Patient denies the need for dosage adjustments at refills on his medications following conclusion of the encounter.  Patient's medications to be e-prescribed to pharmacy of choice.  Collaboration of Care: Collaboration of Care: Medication Management AEB provider managing patient's psychiatric medications and Psychiatrist AEB patient being followed by mental health provider  Patient/Guardian was advised Release of Information must be obtained prior to any record release in order to collaborate their care with an outside provider. Patient/Guardian was advised if they have not already done so to contact the registration department to sign all necessary forms in order for 41 to release information regarding their care.   Consent: Patient/Guardian gives verbal consent for treatment and assignment of benefits for services provided during this visit. Patient/Guardian expressed understanding and agreed to proceed.   1. Schizoaffective disorder, bipolar type (HCC)  - haloperidol (HALDOL) 10 MG tablet; Take 1 tablet (10 mg total) by mouth once nightly at bedtime.  Dispense: 30 tablet; Refill: 2 - benztropine (COGENTIN) 0.5 MG tablet; Take 1 tablet (0.5 mg total) by mouth 2 (two) times daily.  Dispense: 60 tablet; Refill: 2 - divalproex (DEPAKOTE ER) 500 MG 24 hr tablet; Take 1 tablet (500 mg total) by mouth at bedtime.  Dispense: 30 tablet; Refill: 2  Patient to follow-up in 2 months Provider spent a total of 14 minutes with the patient/reviewing patient's chart  [facility], PA 04/07/2022, 8:17 PM

## 2022-04-10 ENCOUNTER — Other Ambulatory Visit: Payer: Self-pay

## 2022-05-24 ENCOUNTER — Other Ambulatory Visit: Payer: Self-pay

## 2022-05-26 ENCOUNTER — Telehealth (HOSPITAL_COMMUNITY): Payer: Self-pay | Admitting: Physician Assistant

## 2022-05-26 NOTE — Telephone Encounter (Signed)
Patient lvm asking for a call back.... Returned his call no ans... lvm for pt to call us back 05/26/22 @ 1:45 Durwin Nora

## 2022-05-31 ENCOUNTER — Other Ambulatory Visit: Payer: Self-pay

## 2022-06-16 ENCOUNTER — Telehealth: Payer: Medicare Other | Admitting: Physician Assistant

## 2022-06-16 DIAGNOSIS — Z79899 Other long term (current) drug therapy: Secondary | ICD-10-CM

## 2022-06-16 NOTE — Progress Notes (Signed)
Virtual Visit Consent   Steven Clayton, you are scheduled for a virtual visit with a [facility] provider today. Just as with appointments in the office, your consent must be obtained to participate. Your consent will be active for this visit and any virtual visit you may have with one of our providers in the next 365 days. If you have a MyChart account, a copy of this consent can be sent to you electronically.  As this is a virtual visit, video technology does not allow for your provider to perform a traditional examination. This may limit your provider's ability to fully assess your condition. If your provider identifies any concerns that need to be evaluated in person or the need to arrange testing (such as labs, EKG, etc.), we will make arrangements to do so. Although advances in technology are sophisticated, we cannot ensure that it will always work on either your end or our end. If the connection with a video visit is poor, the visit may have to be switched to a telephone visit. With either a video or telephone visit, we are not always able to ensure that we have a secure connection.  By engaging in this virtual visit, you consent to the provision of healthcare and authorize for your insurance to be billed (if applicable) for the services provided during this visit. Depending on your insurance coverage, you may receive a charge related to this service.  I need to obtain your verbal consent now. Are you willing to proceed with your visit today? Steven Clayton has provided verbal consent on 06/16/2022 for a virtual visit (video or telephone). Mar Daring, PA-C  Date: 06/16/2022 3:34 PM  Virtual Visit via Video Note   I, Mar Daring, connected with  Steven Clayton  ([id number], June 10, 1981) on 06/16/22 at  4:15 PM EDT by a video-enabled telemedicine application and verified that I am speaking with the correct person using two identifiers.  Location: Patient: Virtual Visit  Location Patient: Home Provider: Virtual Visit Location Provider: Home Office   I discussed the limitations of evaluation and management by telemedicine and the availability of in person appointments. The patient expressed understanding and agreed to proceed.    History of Present Illness: Steven Clayton is a 41 y.o. who identifies as a male who was assigned male at birth, and is being seen today for question of medication refills.   Problems:  Patient Active Problem List   Diagnosis Date Noted   Essential hypertension 03/18/2021   Involuntary commitment 03/14/2021   Iron deficiency anemia 01/20/2015   Schizoaffective disorder, bipolar type ([place]) 01/19/2015   Tobacco use disorder, moderate, dependence 01/19/2015    Allergies: No Known Allergies Medications:  Current Outpatient Medications:    benztropine (COGENTIN) 0.5 MG tablet, Take 1 tablet (0.5 mg total) by mouth 2 (two) times daily., Disp: 60 tablet, Rfl: 2   divalproex (DEPAKOTE ER) 500 MG 24 hr tablet, Take 1 tablet (500 mg total) by mouth at bedtime., Disp: 30 tablet, Rfl: 2   haloperidol (HALDOL) 10 MG tablet, Take 1 tablet (10 mg total) by mouth once nightly at bedtime., Disp: 30 tablet, Rfl: 2  Observations/Objective: Patient is well-developed, well-nourished in no acute distress.  Resting comfortably at home.  Head is normocephalic, atraumatic.  No labored breathing.  Speech is clear and coherent with logical content.  Patient is alert and oriented at baseline.    Assessment and Plan: 1. Medication management  - Patient just wanting to make sure he had a  refill on his current medications available. - Reviewed medications and advised refill was present on all and he can contact pharmacy for the refill - He voiced understanding  Follow Up Instructions: I discussed the assessment and treatment plan with the patient. The patient was provided an opportunity to ask questions and all were answered. The patient agreed  with the plan and demonstrated an understanding of the instructions.  A copy of instructions were sent to the patient via MyChart unless otherwise noted below.    The patient was advised to call back or seek an in-person evaluation if the symptoms worsen or if the condition fails to improve as anticipated.  Time:  I spent 6 minutes with the patient via telehealth technology discussing the above problems/concerns.    Margaretann Loveless, PA-C

## 2022-06-16 NOTE — Patient Instructions (Signed)
  [name], thank you for joining Mar Daring, PA-C for today's virtual visit.  While this provider is not your primary care provider (PCP), if your PCP is located in our provider database this encounter information will be shared with them immediately following your visit.   [facility] account gives you access to today's visit and all your visits, tests, and labs performed at [facility] " click here if you don't have a [facility] account or go to [email or web address]  Consent: (Patient) Steven Clayton provided verbal consent for this virtual visit at the beginning of the encounter.  Current Medications:  Current Outpatient Medications:    benztropine (COGENTIN) 0.5 MG tablet, Take 1 tablet (0.5 mg total) by mouth 2 (two) times daily., Disp: 60 tablet, Rfl: 2   divalproex (DEPAKOTE ER) 500 MG 24 hr tablet, Take 1 tablet (500 mg total) by mouth at bedtime., Disp: 30 tablet, Rfl: 2   haloperidol (HALDOL) 10 MG tablet, Take 1 tablet (10 mg total) by mouth once nightly at bedtime., Disp: 30 tablet, Rfl: 2   Medications ordered in this encounter:  No orders of the defined types were placed in this encounter.    *If you need refills on other medications prior to your next appointment, please contact your pharmacy*  Follow-Up: Call back or seek an in-person evaluation if the symptoms worsen or if the condition fails to improve as anticipated.  [place] [phone]   If you have been instructed to have an in-person evaluation today at a local Urgent Care facility, please use the link below. It will take you to a list of all of our available [facility], including address, phone number and hours of operation. Please do not delay care.  [facility]  If you or a family member do not have a primary care provider, use the link below to schedule a visit and establish care. When you choose a [facility]  primary care physician or advanced practice provider, you gain a long-term partner in health. Find a Primary Care Provider  Learn more about [facility]'s in-office and virtual care options: [facility]

## 2022-07-10 ENCOUNTER — Other Ambulatory Visit: Payer: Self-pay

## 2022-07-11 ENCOUNTER — Other Ambulatory Visit: Payer: Self-pay

## 2022-07-12 ENCOUNTER — Other Ambulatory Visit: Payer: Self-pay

## 2022-08-28 ENCOUNTER — Other Ambulatory Visit (HOSPITAL_COMMUNITY): Payer: Self-pay | Admitting: Physician Assistant

## 2022-08-28 ENCOUNTER — Other Ambulatory Visit: Payer: Self-pay

## 2022-08-28 DIAGNOSIS — F250 Schizoaffective disorder, bipolar type: Secondary | ICD-10-CM

## 2022-08-31 ENCOUNTER — Other Ambulatory Visit (HOSPITAL_COMMUNITY): Payer: Self-pay | Admitting: Physician Assistant

## 2022-08-31 ENCOUNTER — Other Ambulatory Visit: Payer: Self-pay

## 2022-08-31 DIAGNOSIS — F250 Schizoaffective disorder, bipolar type: Secondary | ICD-10-CM

## 2022-09-05 NOTE — Telephone Encounter (Signed)
Patient must have an appointment scheduled to receive medications. Patient was last seen by this provider on 04/07/2022.

## 2022-09-05 NOTE — Telephone Encounter (Signed)
Patient must have an appointment scheduled to receive medications. Patient was last seen by this provider on 04/07/2022.

## 2022-09-14 ENCOUNTER — Other Ambulatory Visit: Payer: Self-pay

## 2022-10-31 ENCOUNTER — Other Ambulatory Visit (HOSPITAL_COMMUNITY): Payer: Self-pay | Admitting: Physician Assistant

## 2022-10-31 ENCOUNTER — Other Ambulatory Visit: Payer: Self-pay

## 2022-10-31 DIAGNOSIS — F250 Schizoaffective disorder, bipolar type: Secondary | ICD-10-CM

## 2022-11-07 ENCOUNTER — Other Ambulatory Visit: Payer: Self-pay

## 2022-11-07 ENCOUNTER — Ambulatory Visit (HOSPITAL_COMMUNITY)
Admission: EM | Admit: 2022-11-07 | Discharge: 2022-11-07 | Disposition: A | Discharge status: Other Acute Inpatient Hospital | Payer: 59 | Attending: Psychiatry | Admitting: Psychiatry

## 2022-11-07 ENCOUNTER — Inpatient Hospital Stay (HOSPITAL_COMMUNITY)
Admission: AD | Admit: 2022-11-07 | Discharge: 2022-11-20 | DRG: 885 | Disposition: A | Discharge status: Home / Self Care | Payer: 59 | Source: Other Acute Inpatient Hospital | Attending: Psychiatry | Admitting: Psychiatry

## 2022-11-07 ENCOUNTER — Encounter (HOSPITAL_COMMUNITY): Payer: Self-pay | Admitting: Student

## 2022-11-07 DIAGNOSIS — F17210 Nicotine dependence, cigarettes, uncomplicated: Secondary | ICD-10-CM | POA: Diagnosis present

## 2022-11-07 DIAGNOSIS — F419 Anxiety disorder, unspecified: Secondary | ICD-10-CM | POA: Diagnosis present

## 2022-11-07 DIAGNOSIS — F259 Schizoaffective disorder, unspecified: Secondary | ICD-10-CM | POA: Insufficient documentation

## 2022-11-07 DIAGNOSIS — K30 Functional dyspepsia: Secondary | ICD-10-CM | POA: Diagnosis present

## 2022-11-07 DIAGNOSIS — Z1152 Encounter for screening for COVID-19: Secondary | ICD-10-CM | POA: Insufficient documentation

## 2022-11-07 DIAGNOSIS — Z79899 Other long term (current) drug therapy: Secondary | ICD-10-CM | POA: Insufficient documentation

## 2022-11-07 DIAGNOSIS — Z91148 Patient's other noncompliance with medication regimen for other reason: Secondary | ICD-10-CM | POA: Insufficient documentation

## 2022-11-07 DIAGNOSIS — G4700 Insomnia, unspecified: Secondary | ICD-10-CM | POA: Diagnosis present

## 2022-11-07 DIAGNOSIS — R440 Auditory hallucinations: Secondary | ICD-10-CM

## 2022-11-07 DIAGNOSIS — Z818 Family history of other mental and behavioral disorders: Secondary | ICD-10-CM

## 2022-11-07 DIAGNOSIS — I10 Essential (primary) hypertension: Secondary | ICD-10-CM | POA: Diagnosis present

## 2022-11-07 DIAGNOSIS — F250 Schizoaffective disorder, bipolar type: Principal | ICD-10-CM | POA: Diagnosis present

## 2022-11-07 DIAGNOSIS — Z8673 Personal history of transient ischemic attack (TIA), and cerebral infarction without residual deficits: Secondary | ICD-10-CM | POA: Diagnosis not present

## 2022-11-07 DIAGNOSIS — Z8249 Family history of ischemic heart disease and other diseases of the circulatory system: Secondary | ICD-10-CM | POA: Diagnosis not present

## 2022-11-07 DIAGNOSIS — F22 Delusional disorders: Secondary | ICD-10-CM | POA: Diagnosis not present

## 2022-11-07 LAB — RESP PANEL BY RT-PCR (RSV, FLU A&B, COVID)  RVPGX2
Influenza A by PCR: NEGATIVE
Influenza B by PCR: NEGATIVE
Resp Syncytial Virus by PCR: NEGATIVE
SARS Coronavirus 2 by RT PCR: NEGATIVE

## 2022-11-07 LAB — COMPREHENSIVE METABOLIC PANEL
ALT: 20 U/L (ref 0–44)
AST: 30 U/L (ref 15–41)
Albumin: 4.7 g/dL (ref 3.5–5.0)
Alkaline Phosphatase: 95 U/L (ref 38–126)
Anion gap: 15 (ref 5–15)
BUN: 12 mg/dL (ref 6–20)
CO2: 25 mmol/L (ref 22–32)
Calcium: 10.2 mg/dL (ref 8.9–10.3)
Chloride: 99 mmol/L (ref 98–111)
Creatinine, Ser: 1.42 mg/dL — ABNORMAL HIGH (ref 0.61–1.24)
GFR, Estimated: >60 mL/min (ref >60)
Glucose, Bld: 79 mg/dL (ref 70–99)
Potassium: 3.8 mmol/L (ref 3.5–5.1)
Sodium: 139 mmol/L (ref 135–145)
Total Bilirubin: 1.3 mg/dL — ABNORMAL HIGH (ref 0.3–1.2)
Total Protein: 8 g/dL (ref 6.5–8.1)

## 2022-11-07 LAB — POCT URINE DRUG SCREEN - MANUAL ENTRY (I-SCREEN)
POC Amphetamine UR: NOT DETECTED
POC Buprenorphine (BUP): NOT DETECTED
POC Cocaine UR: NOT DETECTED
POC Marijuana UR: NOT DETECTED
POC Methadone UR: NOT DETECTED
POC Methamphetamine UR: NOT DETECTED
POC Morphine: NOT DETECTED
POC Oxazepam (BZO): NOT DETECTED
POC Oxycodone UR: NOT DETECTED
POC Secobarbital (BAR): NOT DETECTED

## 2022-11-07 LAB — CBC WITH DIFFERENTIAL/PLATELET
Abs Immature Granulocytes: 0.05 10*3/uL (ref 0.00–0.07)
Basophils Absolute: 0.1 10*3/uL (ref 0.0–0.1)
Basophils Relative: 0 %
Eosinophils Absolute: 0 10*3/uL (ref 0.0–0.5)
Eosinophils Relative: 0 %
HCT: 39.6 % (ref 39.0–52.0)
Hemoglobin: 13.3 g/dL (ref 13.0–17.0)
Immature Granulocytes: 0 %
Lymphocytes Relative: 13 %
Lymphs Abs: 1.8 10*3/uL (ref 0.7–4.0)
MCH: 30 pg (ref 26.0–34.0)
MCHC: 33.6 g/dL (ref 30.0–36.0)
MCV: 89.4 fL (ref 80.0–100.0)
Monocytes Absolute: 0.6 10*3/uL (ref 0.1–1.0)
Monocytes Relative: 4 %
Neutro Abs: 11.7 10*3/uL — ABNORMAL HIGH (ref 1.7–7.7)
Neutrophils Relative %: 83 %
Platelets: 342 10*3/uL (ref 150–400)
RBC: 4.43 MIL/uL (ref 4.22–5.81)
RDW: 13.8 % (ref 11.5–15.5)
WBC: 14.2 10*3/uL — ABNORMAL HIGH (ref 4.0–10.5)
nRBC: 0 % (ref 0.0–0.2)

## 2022-11-07 LAB — LIPID PANEL
Cholesterol: 210 mg/dL — ABNORMAL HIGH (ref 0–200)
HDL: 51 mg/dL (ref >40)
LDL Cholesterol: 130 mg/dL — ABNORMAL HIGH (ref 0–99)
Total CHOL/HDL Ratio: 4.1 RATIO
Triglycerides: 144 mg/dL (ref <150)
VLDL: 29 mg/dL (ref 0–40)

## 2022-11-07 LAB — HEMOGLOBIN A1C
Hgb A1c MFr Bld: 5.5 % (ref 4.8–5.6)
Mean Plasma Glucose: 111 mg/dL

## 2022-11-07 LAB — TSH: TSH: 1.796 u[IU]/mL (ref 0.350–4.500)

## 2022-11-07 LAB — VALPROIC ACID LEVEL: Valproic Acid Lvl: <10 ug/mL — ABNORMAL LOW (ref 50.0–100.0)

## 2022-11-07 LAB — POC SARS CORONAVIRUS 2 AG: SARSCOV2ONAVIRUS 2 AG: NEGATIVE

## 2022-11-07 MED ORDER — LORAZEPAM 1 MG PO TABS: 1.0000 mg | ORAL_TABLET | ORAL | Status: DC | PRN

## 2022-11-07 MED ORDER — HALOPERIDOL 5 MG PO TABS
5.0000 mg | ORAL_TABLET | Freq: Three times a day (TID) | ORAL | Status: DC | PRN
  Administered 2022-11-10: 5 mg via ORAL
  Filled 2022-11-07: qty 1

## 2022-11-07 MED ORDER — ALUM & MAG HYDROXIDE-SIMETH 200-200-20 MG/5ML PO SUSP: 30.0000 mL | ORAL | Status: DC | PRN

## 2022-11-07 MED ORDER — LORAZEPAM 2 MG/ML IJ SOLN: 2.0000 mg | Freq: Three times a day (TID) | INTRAMUSCULAR | Status: DC | PRN

## 2022-11-07 MED ORDER — LORAZEPAM 1 MG PO TABS
2.0000 mg | ORAL_TABLET | Freq: Three times a day (TID) | ORAL | Status: DC | PRN
  Administered 2022-11-10 – 2022-11-11 (×2): 2 mg via ORAL
  Filled 2022-11-07 (×2): qty 2

## 2022-11-07 MED ORDER — HYDROXYZINE HCL 25 MG PO TABS
25.0000 mg | ORAL_TABLET | Freq: Three times a day (TID) | ORAL | Status: DC | PRN
  Administered 2022-11-11 – 2022-11-19 (×2): 25 mg via ORAL
  Filled 2022-11-07 (×2): qty 1

## 2022-11-07 MED ORDER — DIPHENHYDRAMINE HCL 25 MG PO CAPS
50.0000 mg | ORAL_CAPSULE | Freq: Three times a day (TID) | ORAL | Status: DC | PRN
  Administered 2022-11-10 – 2022-11-11 (×2): 50 mg via ORAL
  Filled 2022-11-07 (×2): qty 2

## 2022-11-07 MED ORDER — DIVALPROEX SODIUM ER 500 MG PO TB24: 500.0000 mg | ORAL_TABLET | Freq: Every day | ORAL | Status: DC

## 2022-11-07 MED ORDER — DIPHENHYDRAMINE HCL 50 MG/ML IJ SOLN: 50.0000 mg | Freq: Three times a day (TID) | INTRAMUSCULAR | Status: DC | PRN

## 2022-11-07 MED ORDER — HALOPERIDOL LACTATE 5 MG/ML IJ SOLN: 5.0000 mg | Freq: Three times a day (TID) | INTRAMUSCULAR | Status: DC | PRN

## 2022-11-07 MED ORDER — MAGNESIUM HYDROXIDE 400 MG/5ML PO SUSP: 30.0000 mL | Freq: Every day | ORAL | Status: DC | PRN

## 2022-11-07 MED ORDER — DIVALPROEX SODIUM ER 500 MG PO TB24
500.0000 mg | ORAL_TABLET | Freq: Every day | ORAL | Status: DC
  Administered 2022-11-07: 500 mg via ORAL
  Filled 2022-11-07: qty 1

## 2022-11-07 MED ORDER — ACETAMINOPHEN 325 MG PO TABS: 650.0000 mg | ORAL_TABLET | Freq: Four times a day (QID) | ORAL | Status: DC | PRN

## 2022-11-07 MED ORDER — ALUM & MAG HYDROXIDE-SIMETH 200-200-20 MG/5ML PO SUSP: 30.0000 mL | ORAL | 0 refills | Status: DC | PRN

## 2022-11-07 MED ORDER — HALOPERIDOL 5 MG PO TABS
10.0000 mg | ORAL_TABLET | Freq: Every day | ORAL | Status: DC
  Administered 2022-11-07: 10 mg via ORAL
  Filled 2022-11-07: qty 2

## 2022-11-07 MED ORDER — TRAZODONE HCL 50 MG PO TABS
50.0000 mg | ORAL_TABLET | Freq: Every evening | ORAL | Status: DC | PRN
  Administered 2022-11-08 – 2022-11-19 (×10): 50 mg via ORAL
  Filled 2022-11-07 (×4): qty 1
  Filled 2022-11-07: qty 7
  Filled 2022-11-07 (×6): qty 1

## 2022-11-07 MED ORDER — OLANZAPINE 5 MG PO TBDP: 5.0000 mg | ORAL_TABLET | Freq: Three times a day (TID) | ORAL | Status: DC | PRN

## 2022-11-07 MED ORDER — BENZTROPINE MESYLATE 0.5 MG PO TABS
0.5000 mg | ORAL_TABLET | Freq: Two times a day (BID) | ORAL | Status: DC
  Administered 2022-11-07: 0.5 mg via ORAL
  Filled 2022-11-07: qty 1

## 2022-11-07 MED ORDER — MAGNESIUM HYDROXIDE 400 MG/5ML PO SUSP: 30.0000 mL | Freq: Every day | ORAL | 0 refills | Status: DC | PRN

## 2022-11-07 MED ORDER — HALOPERIDOL 5 MG PO TABS: 10.0000 mg | ORAL_TABLET | Freq: Every day | ORAL | Status: DC

## 2022-11-07 MED ORDER — ZIPRASIDONE MESYLATE 20 MG IM SOLR: 20.0000 mg | INTRAMUSCULAR | Status: DC | PRN

## 2022-11-07 MED ORDER — NICOTINE 14 MG/24HR TD PT24
14.0000 mg | MEDICATED_PATCH | Freq: Every day | TRANSDERMAL | Status: DC
  Administered 2022-11-07 – 2022-11-20 (×14): 14 mg via TRANSDERMAL
  Filled 2022-11-07 (×18): qty 1
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
## 2022-11-07 NOTE — Discharge Instructions (Addendum)
Follow-up recommendations:  Activity:  Normal, as tolerated Diet:  Per PCP recommendation  Patient is instructed prior to discharge to: Take all medications as prescribed by his mental healthcare provider. Report any adverse effects and/or reactions from the medicines to his outpatient provider promptly. Patient has been instructed & cautioned: To not engage in alcohol and or illegal drug use while on prescription medicines.  In the event of worsening symptoms, patient is instructed to call the crisis hotline at 988, 911 and or go to the nearest ED for appropriate evaluation and treatment of symptoms. To follow-up with his primary care provider for your other medical issues, concerns and or health care needs.  
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
## 2022-11-07 NOTE — ED Notes (Signed)
Report called to [employer]. Verbalized understanding.

## 2022-11-07 NOTE — ED Provider Notes (Signed)
FBC/OBS ASAP Discharge Summary  Date and Time: 11/07/2022 11:19 AM  Name: Steven Clayton  MRN:  [id number]   Discharge Diagnoses:  Final diagnoses:  Paranoia [facility])  History of medication noncompliance  Auditory hallucination    Subjective: Steven Clayton is a 42 y/o male with a history of schizoaffective disorder who presented to [facility] voluntarily via [employer] for paranoia, " I was trying to get away from a killer who is trying to follow me" and medication noncompliance x 1 month.    Stay Summary: Patient was admitted to observation unit where he was restarted on Depakote and Haldol. Patient did not pose behavioral concerns. On AM reassessment, patient was pleasant but disorganized, not able to give an accurate timeline of recent events. He voiced paranoia that he was still afraid of the "killer trying to get me." He also endorsed hearing demonic voices as well as neighbors outside of his window while at his apartment, but he isn't clear as to whether the demonic voices are still occurring. At times he does appear suspicious while looking toward the wall. He denies SI and HI today.  He is ready to restart his medications and agreeable to inpatient hospitalization.   With his WBC elevated, he is asked extensively about somatic concerns. He denies GI/GU sx, denies feeling subjectively febrile, and denies likelihood of STI. He states that his heart rate is typically elevated when he is talking, as he gets excited. He is advised that additional blood work will be obtained.     Total Time spent with patient: 30 minutes  Past Psychiatric History: Schizoaffective disorder, no current psychiatrist. Previously with an ACTT team, no longer has services. Past Medical History: See chart Family History: See H&P Family Psychiatric History: See H&P Social History: Lives alone in an apartment. Unemployed. Smokes cigarettes but denies all other drug and alcohol use; previous cocaine and marijuana use.  Tobacco  Cessation:  Prescription not provided because: Patient to [facility]  Current Medications:  Current Facility-Administered Medications  Medication Dose Route Frequency Provider Last Rate Last Admin   acetaminophen (TYLENOL) tablet 650 mg  650 mg Oral Q6H PRN Evette Georges, NP       alum & mag hydroxide-simeth (MAALOX/MYLANTA) 200-200-20 MG/5ML suspension 30 mL  30 mL Oral Q4H PRN Evette Georges, NP       benztropine (COGENTIN) tablet 0.5 mg  0.5 mg Oral BID Evette Georges, NP   0.5 mg at 11/07/22 0954   divalproex (DEPAKOTE ER) 24 hr tablet 500 mg  500 mg Oral QHS Rosezetta Schlatter, MD   500 mg at 11/07/22 0954   haloperidol (HALDOL) tablet 10 mg  10 mg Oral QHS Rosezetta Schlatter, MD   10 mg at 11/07/22 0954   OLANZapine zydis (ZYPREXA) disintegrating tablet 5 mg  5 mg Oral Q8H PRN Evette Georges, NP       And   LORazepam (ATIVAN) tablet 1 mg  1 mg Oral PRN Evette Georges, NP       And   ziprasidone (GEODON) injection 20 mg  20 mg Intramuscular PRN Evette Georges, NP       magnesium hydroxide (MILK OF MAGNESIA) suspension 30 mL  30 mL Oral Daily PRN Evette Georges, NP       Current Outpatient Medications  Medication Sig Dispense Refill   benztropine (COGENTIN) 0.5 MG tablet Take 1 tablet (0.5 mg total) by mouth 2 (two) times daily. 60 tablet 2   divalproex (DEPAKOTE ER) 500 MG 24 hr tablet Take 1 tablet (  500 mg total) by mouth at bedtime. 30 tablet 2   haloperidol (HALDOL) 10 MG tablet Take 1 tablet (10 mg total) by mouth once nightly at bedtime. 30 tablet 2   OVER THE COUNTER MEDICATION Take 1 tablet by mouth daily. Vitafusion Multivitamin Gummy Vitamins      PTA Medications:  Facility Ordered Medications  Medication   acetaminophen (TYLENOL) tablet 650 mg   alum & mag hydroxide-simeth (MAALOX/MYLANTA) 200-200-20 MG/5ML suspension 30 mL   magnesium hydroxide (MILK OF MAGNESIA) suspension 30 mL   OLANZapine zydis (ZYPREXA) disintegrating tablet 5 mg   And   LORazepam (ATIVAN) tablet 1 mg   And    ziprasidone (GEODON) injection 20 mg   benztropine (COGENTIN) tablet 0.5 mg   divalproex (DEPAKOTE ER) 24 hr tablet 500 mg   haloperidol (HALDOL) tablet 10 mg   PTA Medications  Medication Sig   haloperidol (HALDOL) 10 MG tablet Take 1 tablet (10 mg total) by mouth once nightly at bedtime.   benztropine (COGENTIN) 0.5 MG tablet Take 1 tablet (0.5 mg total) by mouth 2 (two) times daily.   divalproex (DEPAKOTE ER) 500 MG 24 hr tablet Take 1 tablet (500 mg total) by mouth at bedtime.       04/07/2022    3:44 PM 12/02/2021    3:31 PM 10/07/2021    2:34 PM  Depression screen PHQ 2/9  Decreased Interest 1 1 1   Down, Depressed, Hopeless 1 1 0  PHQ - 2 Score 2 2 1   Altered sleeping 2 2   Tired, decreased energy 1 2   Change in appetite 2 2   Feeling bad or failure about yourself  2 2   Trouble concentrating 1 1   Moving slowly or fidgety/restless 1 1   Suicidal thoughts 1 1   PHQ-9 Score 12 13   Difficult doing work/chores Somewhat difficult Somewhat difficult     Flowsheet Row ED from 11/07/2022 in [facility] Video Visit from 04/07/2022 in [facility] Office Visit from 12/02/2021 in [place] No Risk No Risk No Risk       Musculoskeletal  Strength & Muscle Tone: within normal limits Gait & Station: normal Patient leans: N/A  Psychiatric Specialty Exam  Presentation  General Appearance:  Disheveled  Eye Contact: Good  Speech: Clear and Coherent  Speech Volume: Normal  Handedness: Right   Mood and Affect  Mood: Angry; Anxious; Euthymic  Affect: Congruent   Thought Process  Thought Processes: Linear  Descriptions of Associations:Loose  Orientation:Full (Time, Place and Person)  Thought Content:Paranoid Ideation  Diagnosis of Schizophrenia or Schizoaffective disorder in past: -- (unknown)  Duration of Psychotic Symptoms: Less than six months    Hallucinations:Hallucinations: Visual Description of Visual Hallucinations: see demon and they tell me to kill them  Ideas of Reference:Paranoia  Suicidal Thoughts:Suicidal Thoughts: No  Homicidal Thoughts:Homicidal Thoughts: No   Sensorium  Memory: Immediate Fair  Judgment: Fair  Insight: Fair   Community education officer  Concentration: Fair  Attention Span: Good  Recall: Good  Fund of Knowledge: Good  Language: Good   Psychomotor Activity  Psychomotor Activity: Psychomotor Activity: Normal   Assets  Assets: Desire for Improvement; Resilience; Social Support   Sleep  Sleep: Sleep: Fair Number of Hours of Sleep: 8   Nutritional Assessment (For OBS and FBC admissions only) Has the patient had a weight loss or gain of 10 pounds or more in the  last 3 months?: No Has the patient had a decrease in food intake/or appetite?: No Does the patient have dental problems?: No Does the patient have eating habits or behaviors that may be indicators of an eating disorder including binging or inducing vomiting?: No Has the patient recently lost weight without trying?: 0 Has the patient been eating poorly because of a decreased appetite?: 0 Malnutrition Screening Tool Score: 0    Physical Exam  HENT:     Head: Normocephalic.     Nose: Nose normal.  Cardiovascular:     Rate and Rhythm: Normal rate.  Pulmonary:     Effort: Pulmonary effort is normal.  Musculoskeletal:        General: Normal range of motion.     Cervical back: Normal range of motion.  Neurological:     General: No focal deficit present.     Mental Status: He is alert.  Psychiatric:        Mood and Affect: Mood normal.        Behavior: Behavior normal.        Thought Content: Thought content normal.        Judgment: Judgment normal.      Review of Systems  Constitutional: Negative.   HENT: Negative.    Eyes: Negative.   Respiratory: Negative.    Cardiovascular: Negative.    Gastrointestinal: Negative.   Genitourinary: Negative.   Musculoskeletal: Negative.   Skin: Negative.   Neurological: Negative.   Psychiatric/Behavioral:  The patient is nervous/anxious.    Blood pressure 104/82, pulse 88, temperature 98.5 F (36.9 C), temperature source Oral, resp. rate 18, SpO2 100 %. There is no height or weight on file to calculate BMI.  Demographic Factors:  Male, Low socioeconomic status, Living alone, and Unemployed  Loss Factors: Financial problems/change in socioeconomic status  Historical Factors: NA  Risk Reduction Factors:   Sense of responsibility to family and Religious beliefs about death  Continued Clinical Symptoms:  Schizophrenia:   Paranoid or undifferentiated type Currently Psychotic Unstable or Poor Therapeutic Relationship Previous Psychiatric Diagnoses and Treatments  Cognitive Features That Contribute To Risk:  None    Suicide Risk:  Mild:  There are no identifiable plans, no associated intent, mild dysphoria and related symptoms, good self-control (both objective and subjective assessment), few other risk factors, and identifiable protective factors, including available and accessible social support.  Plan Of Care/Follow-up recommendations:  Follow-up recommendations:  Activity:  Normal, as tolerated Diet:  Per PCP recommendation  Patient is instructed prior to discharge to: Take all medications as prescribed by his mental healthcare provider. Report any adverse effects and/or reactions from the medicines to his outpatient provider promptly. Patient has been instructed & cautioned: To not engage in alcohol and or illegal drug use while on prescription medicines.  In the event of worsening symptoms, patient is instructed to call the crisis hotline at 988, 911 and or go to the nearest ED for appropriate evaluation and treatment of symptoms. To follow-up with his primary care provider for your other medical issues, concerns and or  health care needs.   Disposition: [facility] TODAY 11/07/22; Bed Assignment 403-1; PENDING Updated VS, signed vol consent faxed to [phone]  DX: Paranoia  Per [facility] please preadmit to room 403-1.  Pt meets inpatient criteria per Rosezetta Schlatter, MD  Attending Physician will be Dr. Janine Limbo  Report can be called to: Adult unit: [phone]   -[place] unable to draw blood cultures. Advise phlebotomy to obtain while  at [facility] this evening.   Rosezetta Schlatter, MD 11/07/2022, 11:19 AM

## 2022-11-07 NOTE — Group Note (Deleted)
Recreation Therapy Group Note   Group Topic:Animal Assisted Therapy   Group Date: 11/07/2022 Start Time: [id number] End Time: 1033 Facilitators: Adreanne Yono-McCall, LRT,CTRS Location: [address] Dayroom   Animal-Assisted Activity (AAA) Program Checklist/Progress Notes Patient Eligibility Criteria Checklist & Daily Group note for Rec Tx Intervention  AAA/T Program Assumption of Risk Form signed by Patient/ or Parent Legal Guardian Yes  Patient understands his/her participation is voluntary Yes   Affect/Mood: N/A   Participation Level: Did not attend    Clinical Observations/Individualized Feedback:     Plan: Continue to engage patient in RT group sessions 2-3x/week.   Avenly Roberge-McCall, LRT,CTRS 11/07/2022 1:34 PM

## 2022-11-07 NOTE — Progress Notes (Signed)
Pt presents with pleasant mood, affect congruent. Steven Clayton reports '' I just need to get back up on my medications. I need to get a free supply and I will be fine to go back to my apartment. ''  In conversation with the patient he denies any current AH but does con't to report feelings of paranoia '' and that some one is out to get me ''  Pt offered breakfast and given juice and breakfast bar. Pt denies any pain. Currently does not appear to be responding to internal stimuli.

## 2022-11-07 NOTE — Progress Notes (Signed)
Pt was accepted to [facility] 11/07/22; Bed Assignment 403-1; PENDING Updated VS, signed vol consent faxed to [phone]  DX: Paranoia  Per [facility] please preadmit to room 403-1.  Pt meets inpatient criteria per Rosezetta Schlatter, MD  Attending Physician will be Dr. Janine Limbo  Report can be called to: Adult unit: [phone]  Pt can arrive after PENDING items-Bed is ready.  Care Team notified: Day [facility] Lynnda Shields, RN, Leonia Reader, RN, Rosezetta Schlatter, MD  [name], [place] 11/07/2022 @ 11:37 AM
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
## 2022-11-07 NOTE — ED Notes (Signed)
Pt escorted via safe transport to [facility]. All belongings returned.

## 2022-11-07 NOTE — Tx Team (Signed)
Initial Treatment Plan 11/07/2022 6:25 PM [name] [id number]    PATIENT STRESSORS: Traumatic event     PATIENT STRENGTHS: Physical Health    PATIENT IDENTIFIED PROBLEMS: Loneliness   Hopelessness   Depression  Confusion               DISCHARGE CRITERIA:  Improved stabilization in mood, thinking, and/or behavior Safe-care adequate arrangements made Verbal commitment to aftercare and medication compliance  PRELIMINARY DISCHARGE PLAN: Return to previous work or school arrangements  PATIENT/FAMILY INVOLVEMENT: This treatment plan has been presented to and reviewed with the patient, Steven Clayton, has been given the opportunity to ask questions and make suggestions.  Dorris Carnes, RN 11/07/2022, 6:25 PM

## 2022-11-07 NOTE — [facility] Group Notes (Signed)
The focus of this group is to help patients review their daily goal of treatment and discuss progress on daily workbooks. Pt was not present during tonight's wrap up group.  

## 2022-11-07 NOTE — Plan of Care (Signed)
  Problem: Education: Goal: Knowledge of [facility] Education information/materials will improve Outcome: Progressing Goal: Emotional status will improve Outcome: Progressing Goal: Mental status will improve Outcome: Progressing Goal: Verbalization of understanding the information provided will improve Outcome: Progressing   Problem: Activity: Goal: Interest or engagement in activities will improve Outcome: Progressing Goal: Sleeping patterns will improve Outcome: Progressing   
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
## 2022-11-08 ENCOUNTER — Encounter (HOSPITAL_COMMUNITY): Payer: Self-pay

## 2022-11-08 DIAGNOSIS — F250 Schizoaffective disorder, bipolar type: Secondary | ICD-10-CM

## 2022-11-08 MED ORDER — HALOPERIDOL 5 MG PO TABS
5.0000 mg | ORAL_TABLET | Freq: Two times a day (BID) | ORAL | Status: DC
  Administered 2022-11-08 – 2022-11-11 (×6): 5 mg via ORAL
  Filled 2022-11-08 (×10): qty 1

## 2022-11-08 MED ORDER — LISINOPRIL 5 MG PO TABS
5.0000 mg | ORAL_TABLET | Freq: Every day | ORAL | Status: DC
  Administered 2022-11-08 – 2022-11-11 (×4): 5 mg via ORAL
  Filled 2022-11-08 (×7): qty 1

## 2022-11-08 MED ORDER — CLONIDINE HCL 0.2 MG PO TABS
0.2000 mg | ORAL_TABLET | Freq: Once | ORAL | Status: AC
  Administered 2022-11-08: 0.2 mg via ORAL
  Filled 2022-11-08: qty 1

## 2022-11-08 MED ORDER — BENZTROPINE MESYLATE 1 MG PO TABS
1.0000 mg | ORAL_TABLET | Freq: Two times a day (BID) | ORAL | Status: DC
  Administered 2022-11-08 – 2022-11-11 (×6): 1 mg via ORAL
  Filled 2022-11-08 (×10): qty 1

## 2022-11-08 NOTE — Plan of Care (Signed)
  Problem: Education: Goal: Knowledge of [facility] Education information/materials will improve Outcome: Progressing Goal: Emotional status will improve Outcome: Progressing Goal: Mental status will improve Outcome: Progressing Goal: Verbalization of understanding the information provided will improve Outcome: Progressing   Problem: Activity: Goal: Interest or engagement in activities will improve Outcome: Progressing Goal: Sleeping patterns will improve Outcome: Progressing   Problem: Coping: Goal: Ability to verbalize frustrations and anger appropriately will improve Outcome: Progressing Goal: Ability to demonstrate self-control will improve Outcome: Progressing   Problem: Health Behavior/Discharge Planning: Goal: Identification of resources available to assist in meeting health care needs will improve Outcome: Progressing Goal: Compliance with treatment plan for underlying cause of condition will improve Outcome: Progressing   Problem: Physical Regulation: Goal: Ability to maintain clinical measurements within normal limits will improve Outcome: Progressing   Problem: Safety: Goal: Periods of time without injury will increase Outcome: Progressing   
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
## 2022-11-08 NOTE — [facility] Group Notes (Signed)
Pt did not attend NA

## 2022-11-08 NOTE — [facility] Suicide Risk Assessment (Signed)
Suicide Risk Assessment  Admission Assessment    [facility] Admission Suicide Risk Assessment   Nursing information obtained from:  Patient  Demographic factors:  Male, Living alone  Current Mental Status:  NA  Loss Factors:  NA  Historical Factors:  NA  Risk Reduction Factors:  NA  Total Time spent with patient: 1 hour  Principal Problem: Schizoaffective disorder, bipolar type ([place])  Diagnosis:  Principal Problem:   Schizoaffective disorder, bipolar type ([place])  Subjective Data: See H&P.  Continued Clinical Symptoms:  Alcohol Use Disorder Identification Test Final Score (AUDIT): 0 The "Alcohol Use Disorders Identification Test", Guidelines for Use in Primary Care, Second Edition.  World Pharmacologist [facility]). Score between 0-7:  no or low risk or alcohol related problems. Score between 8-15:  moderate risk of alcohol related problems. Score between 16-19:  high risk of alcohol related problems. Score 20 or above:  warrants further diagnostic evaluation for alcohol dependence and treatment.  CLINICAL FACTORS:   Bipolar Disorder:   Mixed State Previous Psychiatric Diagnoses and Treatments  Musculoskeletal: Strength & Muscle Tone: within normal limits Gait & Station: normal Patient leans: N/A  Psychiatric Specialty Exam:  Presentation  General Appearance:  Casual; Disheveled  Eye Contact: Good  Speech: Clear and Coherent; Normal Rate (tangential)  Speech Volume: Normal  Handedness: Right  Mood and Affect  Mood: Euphoric  Affect: Congruent  Thought Process  Thought Processes: Disorganized  Descriptions of Associations:Tangential  Orientation:Full (Time, Place and Person)  Thought Content:Illogical; Tangential; Scattered; Perseveration; Paranoid Ideation  History of Schizophrenia/Schizoaffective disorder:Yes  Duration of Psychotic Symptoms:Greater than six months  Hallucinations:Hallucinations: None Description of Visual Hallucinations:  NA  Ideas of Reference:Paranoia  Suicidal Thoughts:Suicidal Thoughts: No  Homicidal Thoughts:Homicidal Thoughts: No  Sensorium  Memory: Immediate Fair; Recent Fair; Remote Poor  Judgment: Impaired  Insight: Poor  Executive Functions  Concentration: Good  Attention Span: Good  Recall: [place] of Knowledge: -- (Limited)  Language: Good  Psychomotor Activity  Psychomotor Activity: Psychomotor Activity: Normal  Assets  Assets: Communication Skills; Desire for Improvement; Financial Resources/Insurance; Housing; Physical Health; Resilience; Social Support  Sleep  Sleep: Sleep: Good Number of Hours of Sleep: 7.5  Physical Exam: Blood pressure (!) 133/97, pulse (!) 107, temperature 98.5 F (36.9 C), temperature source Oral, resp. rate 20, height 5\' 9"  (1.753 m), weight 98.9 kg, SpO2 100 %. Body mass index is 32.19 kg/m.  COGNITIVE FEATURES THAT CONTRIBUTE TO RISK:  Closed-mindedness, Loss of executive function, Polarized thinking, and Thought constriction (tunnel vision)    SUICIDE RISK:   Moderate:  Frequent suicidal ideation with limited intensity, and duration, some specificity in terms of plans, no associated intent, good self-control, limited dysphoria/symptomatology, some risk factors present, and identifiable protective factors, including available and accessible social support.  PLAN OF CARE: See H&P.  I certify that inpatient services furnished can reasonably be expected to improve the patient's condition.   Lindell Spar, NP, pmhnp, fnp-bc 11/08/2022, 2:34 PM

## 2022-11-08 NOTE — Progress Notes (Signed)
Patient appears anxious. Patient denies SI/HI/AH. Pt reports visual hallucinations stating "if you take me on my street and turn me around I would see things".  Pt reports anxiety is 7/10 and depression is 4/10. Pt reports good sleep and appetite. Patient complied with morning medication with no reported side effects. Patient remains safe on Q38min checks and contracts for safety.      11/08/22 1112  Psych Admission Type (Psych Patients Only)  Admission Status Voluntary  Psychosocial Assessment  Patient Complaints Anxiety;Suspiciousness;Depression  Eye Contact Fair  Facial Expression Anxious  Affect Anxious  Speech Logical/coherent  Interaction Minimal  Motor Activity Slow  Appearance/Hygiene Disheveled;Body odor  Behavior Characteristics Cooperative;Anxious  Mood Anxious;Suspicious  Thought Process  Coherency WDL  Content Paranoia  Delusions Paranoid  Perception Hallucinations  Hallucination Auditory  Judgment Poor  Confusion None  Danger to Self  Current suicidal ideation? Denies  Danger to Others  Danger to Others None reported or observed

## 2022-11-08 NOTE — Progress Notes (Signed)
[facility] LCSW Progress Note   11/08/2022    4:45 PM  Steven Clayton      Type of Note: 1 Assessment Attempt   CSW went to visit patient to complete his assessment and patient was not appropriate , getting upset , talking about CSW looked like his cousin and it was throwing him off, saying, " you are young trying to tell me something, you know what you are doing " . Patient continued to say that CSW was the section 8 lady who placed him in an apartment where dead people were. Also said that CSW was laughing, very paranoid, and verbally aggressive in his tone. CSW was only able to get through some of his assessment. CSW did report to NP and doctor of encounter with patient.     Signed:   Silas Flood, MSW, [facility] 11/08/2022 4:45 PM

## 2022-11-08 NOTE — Progress Notes (Signed)
Pt BP 178/120, NP notified, clonidine and lisinopril ordered. Pt complied with medication. Pt denied any symptoms of high blood pressure. Pt is laying in bed and remains safe on Q15 min checks and contracts for safety.    11/08/22 1300  Vital Signs  Pulse Rate 97  Pulse Rate Source Dinamap  Resp 16  BP (!) 178/120  BP Location Left Arm  BP Method Manual  Patient Position (if appropriate) Sitting

## 2022-11-08 NOTE — Progress Notes (Signed)
Chaplain received a consult that Steven Clayton was requesting prayer.  Chaplain engaged with Steven Clayton but was not able to do spiritual assessment or have a meaningful conversation with him due to his mental status at the moment. His words included references to funeral homes and being in a casket, the devil, and celebrities that he has met, as well as conflict with family and friends.   [address] [place] Pager, [phone]

## 2022-11-08 NOTE — BH IP Treatment Plan (Signed)
Interdisciplinary Treatment and Diagnostic Plan Update  11/08/2022 Time of Session: 11:25 AM  Maxine Huynh MRN: [id number]  Principal Diagnosis: Schizoaffective disorder, bipolar type [facility])  Secondary Diagnoses: Principal Problem:   Schizoaffective disorder, bipolar type ([place])   Current Medications:  Current Facility-Administered Medications  Medication Dose Route Frequency Provider Last Rate Last Admin   acetaminophen (TYLENOL) tablet 650 mg  650 mg Oral Q6H PRN Rosezetta Schlatter, MD       alum & mag hydroxide-simeth (MAALOX/MYLANTA) 200-200-20 MG/5ML suspension 30 mL  30 mL Oral Q4H PRN Rosezetta Schlatter, MD       diphenhydrAMINE (BENADRYL) capsule 50 mg  50 mg Oral TID PRN Rosezetta Schlatter, MD       Or   diphenhydrAMINE (BENADRYL) injection 50 mg  50 mg Intramuscular TID PRN Rosezetta Schlatter, MD       haloperidol (HALDOL) tablet 5 mg  5 mg Oral TID PRN Rosezetta Schlatter, MD       Or   haloperidol lactate (HALDOL) injection 5 mg  5 mg Intramuscular TID PRN Rosezetta Schlatter, MD       hydrOXYzine (ATARAX) tablet 25 mg  25 mg Oral TID PRN Rosezetta Schlatter, MD       LORazepam (ATIVAN) tablet 2 mg  2 mg Oral TID PRN Rosezetta Schlatter, MD       Or   LORazepam (ATIVAN) injection 2 mg  2 mg Intramuscular TID PRN Rosezetta Schlatter, MD       magnesium hydroxide (MILK OF MAGNESIA) suspension 30 mL  30 mL Oral Daily PRN Rosezetta Schlatter, MD       nicotine (NICODERM CQ - dosed in mg/24 hours) patch 14 mg  14 mg Transdermal Daily [place], Ovid Curd, MD   14 mg at 11/08/22 7829   traZODone (DESYREL) tablet 50 mg  50 mg Oral QHS PRN Rosezetta Schlatter, MD       PTA Medications: Medications Prior to Admission  Medication Sig Dispense Refill Last Dose   acetaminophen (TYLENOL) 325 MG tablet Take 2 tablets (650 mg total) by mouth every 6 (six) hours as needed for mild pain.      alum & mag hydroxide-simeth (MAALOX/MYLANTA) 200-200-20 MG/5ML suspension Take 30 mLs by mouth every 4 (four) hours as needed for  indigestion. 355 mL 0    benztropine (COGENTIN) 0.5 MG tablet Take 1 tablet (0.5 mg total) by mouth 2 (two) times daily. 60 tablet 2    divalproex (DEPAKOTE ER) 500 MG 24 hr tablet Take 1 tablet (500 mg total) by mouth at bedtime. 30 tablet 2    haloperidol (HALDOL) 10 MG tablet Take 1 tablet (10 mg total) by mouth once nightly at bedtime. 30 tablet 2    magnesium hydroxide (MILK OF MAGNESIA) 400 MG/5ML suspension Take 30 mLs by mouth daily as needed for mild constipation. 355 mL 0    OVER THE COUNTER MEDICATION Take 1 tablet by mouth daily. Vitafusion Multivitamin Gummy Vitamins       Patient Stressors: Traumatic event    Patient Strengths: Physical Health   Treatment Modalities: Medication Management, Group therapy, Case management,  1 to 1 session with clinician, Psychoeducation, Recreational therapy.   Physician Treatment Plan for Primary Diagnosis: Schizoaffective disorder, bipolar type ([place]) Long Term Goal(s): Improvement in symptoms so as ready for discharge   Short Term Goals: Ability to identify and develop effective coping behaviors will improve Ability to maintain clinical measurements within normal limits will improve Compliance with prescribed medications will improve Ability to identify triggers associated  with substance abuse/mental health issues will improve Ability to identify changes in lifestyle to reduce recurrence of condition will improve Ability to verbalize feelings will improve Ability to disclose and discuss suicidal ideas Ability to demonstrate self-control will improve  Medication Management: Evaluate patient's response, side effects, and tolerance of medication regimen.  Therapeutic Interventions: 1 to 1 sessions, Unit Group sessions and Medication administration.  Evaluation of Outcomes: Not Progressing  Physician Treatment Plan for Secondary Diagnosis: Principal Problem:   Schizoaffective disorder, bipolar type ([place])  Long Term Goal(s): Improvement  in symptoms so as ready for discharge   Short Term Goals: Ability to identify and develop effective coping behaviors will improve Ability to maintain clinical measurements within normal limits will improve Compliance with prescribed medications will improve Ability to identify triggers associated with substance abuse/mental health issues will improve Ability to identify changes in lifestyle to reduce recurrence of condition will improve Ability to verbalize feelings will improve Ability to disclose and discuss suicidal ideas Ability to demonstrate self-control will improve     Medication Management: Evaluate patient's response, side effects, and tolerance of medication regimen.  Therapeutic Interventions: 1 to 1 sessions, Unit Group sessions and Medication administration.  Evaluation of Outcomes: Not Progressing   RN Treatment Plan for Primary Diagnosis: Schizoaffective disorder, bipolar type ([place]) Long Term Goal(s): Knowledge of disease and therapeutic regimen to maintain health will improve  Short Term Goals: Ability to remain free from injury will improve, Ability to verbalize frustration and anger appropriately will improve, Ability to demonstrate self-control, Ability to participate in decision making will improve, Ability to verbalize feelings will improve, Ability to disclose and discuss suicidal ideas, Ability to identify and develop effective coping behaviors will improve, and Compliance with prescribed medications will improve  Medication Management: RN will administer medications as ordered by provider, will assess and evaluate patient's response and provide education to patient for prescribed medication. RN will report any adverse and/or side effects to prescribing provider.  Therapeutic Interventions: 1 on 1 counseling sessions, Psychoeducation, Medication administration, Evaluate responses to treatment, Monitor vital signs and CBGs as ordered, Perform/monitor CIWA, COWS, AIMS  and Fall Risk screenings as ordered, Perform wound care treatments as ordered.  Evaluation of Outcomes: Not Progressing   LCSW Treatment Plan for Primary Diagnosis: Schizoaffective disorder, bipolar type ([place]) Long Term Goal(s): Safe transition to appropriate next level of care at discharge, Engage patient in therapeutic group addressing interpersonal concerns.  Short Term Goals: Engage patient in aftercare planning with referrals and resources, Increase social support, Increase ability to appropriately verbalize feelings, Increase emotional regulation, Facilitate acceptance of mental health diagnosis and concerns, Facilitate patient progression through stages of change regarding substance use diagnoses and concerns, Identify triggers associated with mental health/substance abuse issues, and Increase skills for wellness and recovery  Therapeutic Interventions: Assess for all discharge needs, 1 to 1 time with Social worker, Explore available resources and support systems, Assess for adequacy in community support network, Educate family and significant other(s) on suicide prevention, Complete Psychosocial Assessment, Interpersonal group therapy.  Evaluation of Outcomes: Not Progressing   Progress in Treatment: Attending groups: No. Participating in groups: No. Taking medication as prescribed: Yes. Toleration medication: Yes. Family/Significant other contact made: No, will contact:  Whoever pt provides CSW permission to speak to Patient understands diagnosis: Yes. Discussing patient identified problems/goals with staff: Yes. Medical problems stabilized or resolved: Yes. Denies suicidal/homicidal ideation: Yes. Issues/concerns per patient self-inventory: No.   New problem(s) identified: No, Describe:  None reported  New Short Term/Long  Term Goal(s): medication stabilization, elimination of SI thoughts, development of comprehensive mental wellness plan.    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 CSW will continue to follow and assess for appropriate referrals and possible discharge planning.    Reason for Continuation of Hospitalization: Anxiety Delusions  Depression Hallucinations Medication stabilization  Estimated Length of Stay: 3-5 days   Last 3 [place] Suicide Severity Risk Score: Flowsheet Row Admission (Current) from 11/07/2022 in [facility] Most recent reading at 11/07/2022  6:00 PM ED from 11/07/2022 in [facility] Most recent reading at 11/07/2022  3:14 AM Video Visit from 04/07/2022 in [facility] Most recent reading at 04/07/2022  3:44 PM  C-SSRS RISK CATEGORY No Risk No Risk No Risk       Last PHQ 2/9 Scores:    04/07/2022    3:44 PM 12/02/2021    3:31 PM 10/07/2021    2:34 PM  Depression screen PHQ 2/9  Decreased Interest 1 1 1   Down, Depressed, Hopeless 1 1 0  PHQ - 2 Score 2 2 1   Altered sleeping 2 2   Tired, decreased energy 1 2   Change in appetite 2 2   Feeling bad or failure about yourself  2 2   Trouble concentrating 1 1   Moving slowly or fidgety/restless 1 1   Suicidal thoughts 1 1   PHQ-9 Score 12 13   Difficult doing work/chores Somewhat difficult Somewhat difficult     Scribe for Treatment Team: Charlett Lango 11/08/2022 1:57 PM

## 2022-11-08 NOTE — Group Note (Signed)
Recreation Therapy Group Note   Group Topic:Team Building  Group Date: 11/08/2022 Start Time: 0935 End Time: 1007 Facilitators: Daja Shuping-McCall, LRT,CTRS Location: [address] Dayroom   Goal Area(s) Addresses:  Patient will effectively work with peer towards shared goal.  Patient will identify skills used to make activity successful.  Patient will identify how skills used during activity can be used to reach post d/c goals.    Group Description: Landing Pad. In teams of 3-5, patients were given 12 plastic drinking straws and an equal length of masking tape. Using the materials provided, patients were asked to build a landing pad to catch a golf ball dropped from approximately 5 feet in the air. All materials were required to be used by the team in their design. LRT facilitated post-activity discussion.   Affect/Mood: N/A   Participation Level: Did not attend    Clinical Observations/Individualized Feedback:      Plan: Continue to engage patient in RT group sessions 2-3x/week.   Ayman Brull-McCall, LRT,CTRS  11/08/2022 1:34 PM

## 2022-11-09 DIAGNOSIS — F250 Schizoaffective disorder, bipolar type: Secondary | ICD-10-CM | POA: Diagnosis not present

## 2022-11-09 NOTE — Plan of Care (Signed)
  Problem: Safety: Goal: Periods of time without injury will increase Outcome: Progressing   

## 2022-11-09 NOTE — Progress Notes (Signed)
[facility] MD Progress Note  11/09/2022 8:58 AM Steven Clayton  MRN:  [id number]  Reason for admission: 42 year old AA male with hx of metal illness & probable hx of substance use disorders. He is known in this [facility] with complain of worsening symptoms of Schizoaffective disorder, bipolar type. Patient was a patient in this [facility] last in July of 2022. He is being admitted to [facility] this time around with complain of increased paranoia/delusional thinking. Patient apparently had called the cops seeking a protective custody as he felt there were some people after him. After the cops picked him-up, he was taken to the [facility] for evaluation & was later transferred to the [facility] for further psychiatric evaluation/treatments.   Daily notes: Steven Clayton is seen today in his room with the SW presents. He was sitting up in his bed. He presents alert, oriented to self & situation. He is making a good eye contact & verbally responsive. However, his statements remain disorganized, illogical at times & tangential. He reports, "We just finished group. I'm still feeling unsafe because of what is going on out there. There is a cover-up. The cops are using their cars to block the situation. I can't stay like that, worried for my safety & everything. I have a weakness for being mean to people". During this follow-up care assessment, the SW used this opportunity to complete patient's initial assessment. Again patient continues to ramble, jumping from topic to topic. Apparently, he has had an Ac Team services in the past. Patient is observed manic, can flip from laughing to an instant agitation/irritability which dissipates within a short period of time. He named his mother as his support system. Patient was started on Haldol 5 mg bid yesterday including cogentin 1 mg bid. The plan is to probably transition patient to injectable form of haldol by discharge due to hx of non-compliance to his treatment regimen. Steven Clayton currently denies any SIHI, AVH,  delusional thoughts or paranoia. He does not appear to be responding to any internal stimuli. Reviewed vital signs, stable. Reviewed current lab results, stable.  Principal Problem: Schizoaffective disorder, bipolar type ([place])  Diagnosis: Principal Problem:   Schizoaffective disorder, bipolar type ([place])  Total Time spent with patient:  35 minutes  Past Psychiatric History: See H&P.  Past Medical History:  Past Medical History:  Diagnosis Date   Anxiety    Depression    Schizoaffective disorder ([place])    Stroke [facility])    History reviewed. No pertinent surgical history.  Family History:  Family History  Problem Relation Age of Onset   Heart disease Mother    Hyperlipidemia Mother    Mental illness Father    Family Psychiatric  History: See H&P.  Social History:  Social History   Substance and Sexual Activity  Alcohol Use Yes   Alcohol/week: 1.0 standard drink of alcohol   Types: 1 Glasses of wine per week     Social History   Substance and Sexual Activity  Drug Use No    Social History   Socioeconomic History   Marital status: Single    Spouse name: Not on file   Number of children: Not on file   Years of education: Not on file   Highest education level: Not on file  Occupational History   Not on file  Tobacco Use   Smoking status: Some Days    Packs/day: 2    Types: Cigarettes   Smokeless tobacco: Never  Vaping Use   Vaping Use: Never used  Substance and Sexual Activity   Alcohol use: Yes    Alcohol/week: 1.0 standard drink of alcohol    Types: 1 Glasses of wine per week   Drug use: No   Sexual activity: Never  Other Topics Concern   Not on file  Social History Narrative   Not on file   Social Determinants of Health   Financial Resource Strain: Not on file  Food Insecurity: No Food Insecurity (11/07/2022)   Hunger Vital Sign    Worried About Running Out of Food in the Last Year: Never true    Ran Out of Food in the Last Year: Never true   Transportation Needs: No Transportation Needs (11/07/2022)   PRAPARE - Hydrologist (Medical): No    Lack of Transportation (Non-Medical): No  Physical Activity: Not on file  Stress: Not on file  Social Connections: Not on file   Additional Social History:   Sleep: Good  Appetite:  Good  Current Medications: Current Facility-Administered Medications  Medication Dose Route Frequency Provider Last Rate Last Admin   acetaminophen (TYLENOL) tablet 650 mg  650 mg Oral Q6H PRN Rosezetta Schlatter, MD       alum & mag hydroxide-simeth (MAALOX/MYLANTA) 200-200-20 MG/5ML suspension 30 mL  30 mL Oral Q4H PRN Rosezetta Schlatter, MD       benztropine (COGENTIN) tablet 1 mg  1 mg Oral BID Lindell Spar I, NP   1 mg at 11/09/22 [id number]   diphenhydrAMINE (BENADRYL) capsule 50 mg  50 mg Oral TID PRN Rosezetta Schlatter, MD       Or   diphenhydrAMINE (BENADRYL) injection 50 mg  50 mg Intramuscular TID PRN Rosezetta Schlatter, MD       haloperidol (HALDOL) tablet 5 mg  5 mg Oral TID PRN Rosezetta Schlatter, MD       Or   haloperidol lactate (HALDOL) injection 5 mg  5 mg Intramuscular TID PRN Rosezetta Schlatter, MD       haloperidol (HALDOL) tablet 5 mg  5 mg Oral BID Lindell Spar I, NP   5 mg at 11/09/22 [id number]   hydrOXYzine (ATARAX) tablet 25 mg  25 mg Oral TID PRN Rosezetta Schlatter, MD       lisinopril (ZESTRIL) tablet 5 mg  5 mg Oral Daily [name], Herbert Pun I, NP   5 mg at 11/09/22 0851   LORazepam (ATIVAN) tablet 2 mg  2 mg Oral TID PRN Rosezetta Schlatter, MD       Or   LORazepam (ATIVAN) injection 2 mg  2 mg Intramuscular TID PRN Rosezetta Schlatter, MD       magnesium hydroxide (MILK OF MAGNESIA) suspension 30 mL  30 mL Oral Daily PRN Rosezetta Schlatter, MD       nicotine (NICODERM CQ - dosed in mg/24 hours) patch 14 mg  14 mg Transdermal Daily Massengill, Ovid Curd, MD   14 mg at 11/09/22 0851   traZODone (DESYREL) tablet 50 mg  50 mg Oral QHS PRN Rosezetta Schlatter, MD   50 mg at 11/08/22 2137   Lab Results:   Results for orders placed or performed during the hospital encounter of 11/07/22 (from the past 48 hour(s))  Culture, blood (Routine X 2) w Reflex to ID Panel     Status: None (Preliminary result)   Collection Time: 11/08/22  6:30 PM   Specimen: BLOOD  Result Value Ref Range   Specimen Description      BLOOD BLOOD RIGHT ARM Performed at [facility], [place] [address] [place], [place] [address]    Special Requests      BOTTLES DRAWN AEROBIC ONLY Blood Culture adequate volume Performed at [facility] [address] [place], [place] [address]    Culture      NO GROWTH < 12 HOURS Performed at [facility] [address], [place], [place] [address]    Report Status PENDING   Culture, blood (Routine X 2) w Reflex to ID Panel     Status: None (Preliminary result)   Collection Time: 11/08/22  6:30 PM   Specimen: BLOOD RIGHT ARM  Result Value Ref Range   Specimen Description      BLOOD RIGHT ARM Performed at [facility], [place] [address] [place], [place] [address]    Special Requests      BOTTLES DRAWN AEROBIC ONLY Blood Culture adequate volume Performed at [facility] [address] [place], [place] [address]    Culture      NO GROWTH < 12 HOURS Performed at [facility] [address], [place], [place] [address]    Report Status PENDING    Blood Alcohol level:  Lab Results  Component Value Date   [facility] <10 03/14/2021   ETH <5 40/05/2724   Metabolic Disorder Labs: Lab Results  Component Value Date   HGBA1C 5.5 11/07/2022   MPG 111 11/07/2022   MPG 111 03/17/2021   Lab Results  Component Value Date   PROLACTIN 33.3 (H) 03/17/2021   PROLACTIN 26.5 (H) 01/20/2015   Lab Results  Component Value Date   CHOL 210 (H) 11/07/2022   TRIG 144 11/07/2022   HDL 51 11/07/2022   CHOLHDL 4.1 11/07/2022   VLDL 29 11/07/2022   LDLCALC 130 (H) 11/07/2022   LDLCALC 102 (H) 03/17/2021   Physical  Findings: AIMS:  , ,  ,  ,    CIWA:    COWS:     Musculoskeletal: Strength & Muscle Tone: within normal limits Gait & Station: normal Patient leans: N/A  Psychiatric Specialty Exam:  Presentation  General Appearance:  Casual; Disheveled  Eye Contact: Good  Speech: Clear and Coherent; Normal Rate (tangential)  Speech Volume: Normal  Handedness: Right   Mood and Affect  Mood: Euphoric  Affect: Congruent   Thought Process  Thought Processes: Disorganized  Descriptions of Associations:Tangential  Orientation:Full (Time, Place and Person)  Thought Content:Illogical; Tangential; Scattered; Perseveration; Paranoid Ideation  History of Schizophrenia/Schizoaffective disorder:Yes  Duration of Psychotic Symptoms:Greater than six months  Hallucinations:Hallucinations: None Description of Visual Hallucinations: NA  Ideas of Reference:Paranoia  Suicidal Thoughts:Suicidal Thoughts: No  Homicidal Thoughts:Homicidal Thoughts: No   Sensorium  Memory: Immediate Fair; Recent Fair; Remote Poor  Judgment: Impaired  Insight: Poor   Executive Functions  Concentration: Good  Attention Span: Good  Recall: [place] of Knowledge: -- (Limited)  Language: Good  Psychomotor Activity  Psychomotor Activity: Psychomotor Activity: Normal  Assets  Assets: Communication Skills; Desire for Improvement; Financial Resources/Insurance; Housing; Physical Health; Resilience; Social Support  Sleep  Sleep: Sleep: Good Number of Hours of Sleep: 7.5  Physical Exam: Physical Exam Vitals and nursing note reviewed.  HENT:     Head: Normocephalic.     Nose: Nose normal.     Mouth/Throat:     Pharynx: Oropharynx is clear.  Eyes:     Pupils: Pupils are equal, round, and reactive to light.  Cardiovascular:     Rate and Rhythm: Normal rate.  Pulses: Normal pulses.  Pulmonary:     Effort: Pulmonary effort is normal.  Genitourinary:    Comments:  Deferred Musculoskeletal:        General: Normal range of motion.     Cervical back: Normal range of motion.  Skin:    General: Skin is warm and dry.  Neurological:     General: No focal deficit present.     Mental Status: He is alert and oriented to person, place, and time.    Review of Systems  Constitutional:  Negative for chills, diaphoresis, fever and malaise/fatigue.  HENT:  Negative for congestion and sore throat.   Eyes:  Negative for blurred vision.  Respiratory:  Negative for cough, shortness of breath and wheezing.   Cardiovascular:  Negative for chest pain and palpitations.  Gastrointestinal:  Negative for abdominal pain, diarrhea, heartburn, nausea and vomiting.  Genitourinary:  Negative for dysuria.  Musculoskeletal:  Negative for joint pain and myalgias.  Skin:  Negative for itching and rash.  Neurological:  Negative for dizziness, tingling, tremors, sensory change, speech change, focal weakness, seizures, loss of consciousness, weakness and headaches.  Psychiatric/Behavioral:  Positive for hallucinations. Negative for depression, memory loss, substance abuse and suicidal ideas. The patient is nervous/anxious. The patient does not have insomnia.    Blood pressure 104/81, pulse 94, temperature 97.8 F (36.6 C), temperature source Oral, resp. rate 20, height 5\' 9"  (1.753 m), weight 98.9 kg, SpO2 98 %. Body mass index is 32.19 kg/m.   Treatment Plan Summary: Daily contact with patient to assess and evaluate symptoms and progress in treatment and Medication management.   Continue inpatient hospitalization.  Will continue today 11/09/2022 plan as below except where it is noted.   Principal/active diagnoses.  Schizoaffective disorder, bipolar type ([place]).    Associated symptoms.  Mania.  Agitation.  Grandiosity.  Restlessness.  Delusional thoughts.   Other medical issues.  Hypertension.  Plan:  -Continue Haldol 5 mg po bid for mood control.  -Benztropine 1 mg  po bid for eps prevention.  -Continue hydroxyzine 25 mg po tid prn for anxiety. -Trazodone 50 mg po Q hs prn for insomnia.    Agitation protocols: Cont as recommended;  -Benadryl 50 mg po or IM tid prn. -Haldol 5 mg po or IM tid prn.  -Lorazepam 2 mg po or IM tid prn.   Other medical issues.  -Continue Lisinopril 5 mg po daily for HTN.  -administered Clonidine 0.2 mg po x once for elevated blood pressure - 178/120.   Other PRNS -Continue Tylenol 650 mg every 6 hours PRN for mild pain -Continue Maalox 30 ml Q 4 hrs PRN for indigestion -Continue MOM 30 ml po Q 6 hrs for constipation   Safety and Monitoring: Voluntary admission to inpatient psychiatric unit for safety, stabilization and treatment Daily contact with patient to assess and evaluate symptoms and progress in treatment Patient's case to be discussed in multi-disciplinary team meeting Observation Level : q15 minute checks Vital signs: q12 hours Precautions: Safety   Discharge Planning: Social work and case management to assist with discharge planning and identification of hospital follow-up needs prior to discharge Estimated LOS: 5-7 days Discharge Concerns: Need to establish a safety plan; Medication compliance and effectiveness Discharge Goals: Return home with outpatient referrals for mental health follow-up including medication management/psychotherapy  Lindell Spar, NP, pmhnp, fnp-bc 11/09/2022, 8:58 AM

## 2022-11-09 NOTE — Group Note (Signed)
Date:  11/09/2022 Time:  9:36 AM  Group Topic/Focus:  Orientation:   The focus of this group is to educate the patient on the purpose and policies of crisis stabilization and provide a format to answer questions about their admission.  The group details unit policies and expectations of patients while admitted.    Participation Level:  Active  Participation Quality:  Appropriate  Affect:  Appropriate  Cognitive:  Appropriate  Insight: Appropriate  Engagement in Group:  Engaged  Modes of Intervention:  Discussion  Additional Comments:     Jerrye Beavers 11/09/2022, 9:36 AM

## 2022-11-09 NOTE — BHH Counselor (Signed)
Adult Comprehensive Assessment  Patient ID: Steven Clayton, male   DOB: March 24, 1981, 42 y.o.   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 Therapist, music / Learning stressors: " No " Employment / Job issues: " No " Family Relationships: " No Publishing copy / Lack of resources (include bankruptcy): " No " Housing / Lack of housing: " seeing the dead people " Physical health (include injuries & life threatening diseases): " No " Social relationships: " No " Substance abuse: " No " Bereavement / Loss: " No "  Living/Environment/Situation:  Living Arrangements: Alone Living conditions (as described by patient or guardian): patient lives in an apartment through section 8 Who else lives in the home?: patient How long has patient lived in current situation?: pt did not say What is atmosphere in current home: Dangerous, Temporary  Family History:  Marital status: Single Are you sexually active?: No What is your sexual orientation?: pt did not say, to manic Has your sexual activity been affected by drugs, alcohol, medication, or emotional stress?: pt denies using drugs, medications, or alcohol Does patient have children?: No  Childhood History:  By whom was/is the patient raised?: Both parents, Grandparents, Other (Comment) Additional childhood history information: pt did not say to manic Description of patient's relationship with caregiver when they were a child: " wasn't good at all " Patient's description of current relationship with people who raised him/her: " we are not close at all " How were you disciplined when you got in trouble as a child/adolescent?: pt did not say to manic Does  patient have siblings?: Yes Number of Siblings: 4 Description of patient's current relationship with siblings: " i do not talk to them nor are we close " Did patient suffer any verbal/emotional/physical/sexual abuse as a child?: No Did patient suffer from severe childhood neglect?: No Has patient ever been sexually abused/assaulted/raped as an adolescent or adult?: No Was the patient ever a victim of a crime or a disaster?: No Witnessed domestic violence?: No Has patient been affected by domestic violence as an adult?: No  Education:  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, multimedia Currently a student?: No Learning disability?: No  Employment/Work Situation:   Employment Situation: On disability Why is Patient on Disability: pt did not say to manic How Long has Patient Been on Disability: " i been on that for awhile, we do not need to talk about that " Patient's Job has Been Impacted by Current Illness: No What is the Longest Time Patient has Held a Job?: 4 years 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 / [employer] Has Patient ever Been in the [employer]?: No  Financial Resources:   Museum/gallery curator resources: Teacher, early years/pre, Medicare  Alcohol/Substance Abuse:   What has been your use of drugs/alcohol within the last 12 months?: pt denies If attempted suicide, did drugs/alcohol play a role in this?: No If yes, describe treatment: N/A Has alcohol/substance abuse ever caused legal problems?: No  Social Support System:   Pensions consultant Support System: Fair Dietitian Support System: Friends and Family Type of faith/religion: Darrick Meigs How does patient's faith help to cope with current illness?: pt did not say  Leisure/Recreation:  Do You Have Hobbies?: Yes Leisure and Hobbies: playing games  Strengths/Needs:   What is the patient's perception of their strengths?: " Nothing " Patient states they can use these personal strengths during their  treatment to contribute to their recovery: Pt did not say Patient states these barriers may affect/interfere with their treatment: " the only barriers is the dead people that were in the yard " Patient states these barriers may affect their return to the community: Pt did not say other than mentioning the dead people again Other important information patient would like considered in planning for their treatment: N/a  Discharge Plan:   Currently receiving community mental health services: No Patient states concerns and preferences for aftercare planning are: pt declined follow up and getting connected to an ACTT team Patient states they will know when they are safe and ready for discharge when: " I feel fine , I just do not need to return back where those dead people are " Does patient have access to transportation?: Yes Does patient have financial barriers related to discharge medications?: No Will patient be returning to same living situation after discharge?: Yes  Summary/Recommendations:   Summary and Recommendations (to be completed by the evaluator): Aryon Navarette is a 42 y/o male who was admitted to the hospital for feeling unsafe, stated that people were following him, he was seeing dead people in the yard and felt that the demons was trying to come onto him. Uwe states that he does not have any stressors other than were he lives. Devery during assessment was going back and forth about the dead bodies and how the CSW was " trying " to tell him about [place] when asking him what brought him here to the hospital. Patient was VERY manic. 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 Idiris has been hospitalized multiple times back since 2006. Patient declined getting reconnected with an ACT team, states that he does not need that . Patient psychiatric history is Schizoaffective disorder, bipolar type, Paranoia, History of medication noncompliance, and   auditory hallucination. During assessment patient was MANIC, would answer one question and get back to ragging and laughing innappropriately. Also declined for CSW to call mom.While here, Hickman Emswiler can benefit from crisis stabilization, medication management, therapeutic milieu, and referrals for services.   Sherre Lain. 11/09/2022

## 2022-11-09 NOTE — Progress Notes (Signed)
   11/09/22 2100  Psych Admission Type (Psych Patients Only)  Admission Status Voluntary  Psychosocial Assessment  Patient Complaints Suspiciousness  Eye Contact Suspiciousness  Facial Expression Animated  Affect Appropriate to circumstance  Speech Logical/coherent  Interaction Minimal  Motor Activity Slow  Appearance/Hygiene Disheveled  Behavior Characteristics Cooperative;Anxious  Mood Suspicious  Thought Process  Coherency WDL  Content Paranoia  Delusions Paranoid  Perception Hallucinations  Hallucination Auditory  Judgment Poor  Confusion Mild  Danger to Self  Current suicidal ideation? Denies  Danger to Others  Danger to Others None reported or observed   Alert/oriented. Makes needs/concerns known to staff. Pleasant cooperative with staff. Denies SI/HI/V hallucinations, but states does hear some voices, but not command voices. Will encourage  compliance and progression towards goals. Verbally contracted for safety. Will continue to monitor.

## 2022-11-09 NOTE — Progress Notes (Signed)
Pt attended group and remains medication compliant. Pt presents with sad affect. Pt denies suicidal thoughts at this time. Q 15 minute checks ongoing.

## 2022-11-09 NOTE — Group Note (Signed)
Date:  11/09/2022 Time:  4:48 PM  Group Topic/Focus:  Wellness Toolbox:   The focus of this group is to discuss various aspects of wellness, balancing those aspects and exploring ways to increase the ability to experience wellness.  Patients will create a wellness toolbox for use upon discharge.    Participation Level:  Active  Participation Quality:  Appropriate  Affect:  Appropriate  Cognitive:  Appropriate  Insight: Appropriate  Engagement in Group:  Engaged  Modes of Intervention:  Discussion  Additional Comments:     Jerrye Beavers 11/09/2022, 4:48 PM

## 2022-11-09 NOTE — Group Note (Signed)
Date:  11/09/2022 Time:  4:01 PM  Group Topic/Focus:  Wellness Toolbox:   The focus of this group is to discuss various aspects of wellness, balancing those aspects and exploring ways to increase the ability to experience wellness.  Patients will create a wellness toolbox for use upon discharge.    Participation Level:  Active  Participation Quality:  Appropriate  Affect:  Appropriate  Cognitive:  Appropriate  Insight: Appropriate  Engagement in Group:  Engaged  Modes of Intervention:  Discussion  Additional Comments:     Jerrye Beavers 11/09/2022, 4:01 PM

## 2022-11-09 NOTE — BHH Group Notes (Signed)
[place] Group Notes:  (Nursing/MHT/Case Management/Adjunct)  Date:  11/09/2022  Time:  9:19 PM  Type of Therapy:  Group Therapy  Participation Level:  Active  Participation Quality:  Appropriate  Affect:  Appropriate  Cognitive:  Appropriate  Insight:  Appropriate  Engagement in Group:  Engaged  Modes of Intervention:  Education  Summary of Progress/Problems: Goal to work on Insurance risk surveyor. Participated in anger management exercise. Day 10/10.  Orvan Falconer 11/09/2022, 9:19 PM

## 2022-11-10 DIAGNOSIS — F250 Schizoaffective disorder, bipolar type: Secondary | ICD-10-CM | POA: Diagnosis not present

## 2022-11-10 NOTE — Group Note (Signed)
Recreation Therapy Group Note   Group Topic:Problem Solving  Group Date: 11/10/2022 Start Time: 0930 End Time: 1015 Facilitators: Gurtaj Ruz-McCall, LRT,CTRS Location: [address] Dayroom   Goal Area(s) Addresses:  Patient will effectively work with peer towards shared goal.  Patient will identify skills used to make activity successful.  Patient will share challenges and verbalize solution-driven approaches used. Patient will identify how skills used during activity can be used to reach post d/c goals.    Group Description: [employer]. Patients were provided the following materials: 2 drinking straws, 5 rubber bands, 5 paper clips, 2 index cards and 2 drinking cups. Using the provided materials patients were asked to build a launching mechanism to launch a ping pong ball across the room, approximately 10 feet. Patients were divided into teams of 3-5. Instructions required all materials be incorporated into the device, functionality of items left to the peer group's discretion.   Affect/Mood: N/A   Participation Level: Did not attend    Clinical Observations/Individualized Feedback:     Plan: Continue to engage patient in RT group sessions 2-3x/week.   Clenton Esper-McCall, LRT,CTRS 11/10/2022 11:56 AM

## 2022-11-10 NOTE — Progress Notes (Signed)
Pt on unit, medication compliant. Attending group. Pt reports adequate sleep last night. Pt denies SI/HI/self harm thoughts as well as a/v hallucinations. Pts speech remains tangential and pt presents paranoid. Q 15 minute checks ongoing.

## 2022-11-10 NOTE — Progress Notes (Signed)
Writer was at the NS when they witnessed the pt storm out of the dayroom yelling obscenities at someone that is in the dayroom. Pt was yelling "these bitches better leave me alone and they better stop talking about my momma! They don't know shit about my momma!". When pt came back towards the NS writer pulled pt to the side to ask what happened and if they were okay. Pt proceeded to tell writer about the hospital trying to kill them with medications and speaking about their mother and a lot of topics that don't relate to anything going on in the hospital. Pt then stops and says to writer "I'm just gone ask you right now. Do you have a problem with me?!" Writer was confused and responded to the pt, "me?" And pt says "yea!". Pt then begins to walk towards writer in an aggressive manner. Writer held their arm out and asks pt to step away from Probation officer but pt is still behaving in an aggressive manner so Probation officer presses their distress badge. More staff approached and pt continues to yell at writer "you better not say shit else to me!". Pt Nurse walked pt down to their room in which pt continues to yell. Pt Nurse informs Probation officer that the pt thought that the writer was someone else. Staffed removed pt roommate for safety reasons.

## 2022-11-10 NOTE — Progress Notes (Signed)
Adult Psychoeducational Group Note  Date:  11/10/2022 Time:  5:11 PM  Group Topic/Focus:  Goals Group:   The focus of this group is to help patients establish daily goals to achieve during treatment and discuss how the patient can incorporate goal setting into their daily lives to aide in recovery. Orientation:   The focus of this group is to educate the patient on the purpose and policies of crisis stabilization and provide a format to answer questions about their admission.  The group details unit policies and expectations of patients while admitted.  Participation Level:  Active  Participation Quality:  Appropriate  Affect:  Appropriate  Cognitive:  Appropriate  Insight: Appropriate  Engagement in Group:  Engaged  Modes of Intervention:  Discussion  Additional Comments:  Pt attended the goals/orientation group and remained appropriate and engaged throughout the duration of the group.   Beryle Beams 11/10/2022, 5:11 PM

## 2022-11-10 NOTE — BHH Counselor (Signed)
BHH/BMU LCSW Progress Note   11/10/2022    2:13 PM  [name]      Type of Note: ACT Team referral    CSW fxed patient ACT referral over to [facility] intervention in [place], [place] will continue to assist.     Signed:   Silas Flood, MSW, [facility] 11/10/2022 2:13 PM

## 2022-11-10 NOTE — Progress Notes (Signed)
Observed verbally aggressive towards staff yelling/cursing. Patient walked down to room by staff,  verbal-de-escalation attempted with min effectiveness. Patient agreed to medication for agitation.  This Probation officer talked with patient in room for some time before patient started calming down. Will continue to monitor. Safety checks continued. Provider notified.

## 2022-11-10 NOTE — Progress Notes (Signed)
   11/10/22 2054  Psych Admission Type (Psych Patients Only)  Admission Status Voluntary  Psychosocial Assessment  Patient Complaints Suspiciousness  Eye Contact Suspiciousness  Facial Expression Animated  Affect Angry;Blunted  Speech Aggressive;Argumentative;Rapid;Pressured;Loud  Interaction Assertive;Defensive;Demanding;Dominating;Intrusive  Motor Activity Slow  Appearance/Hygiene Disheveled  Behavior Characteristics Agressive verbally;Agitated;Anxious;Intrusive;Irritable  Mood Angry;Anxious  Thought Process  Coherency WDL  Content Paranoia  Delusions Paranoid  Perception Hallucinations  Hallucination Auditory;Visual  Judgment Poor  Confusion Mild  Danger to Self  Current suicidal ideation? Denies  Danger to Others  Danger to Others None reported or observed

## 2022-11-10 NOTE — BHH Suicide Risk Assessment (Signed)
[facility] INPATIENT:  Family/Significant Other Suicide Prevention Education  Suicide Prevention Education:  Education Completed;  [name] ( Mother ) [phone],  (name of family member/significant other) has been identified by the patient as the family member/significant other with whom the patient will be residing, and identified as the person(s) who will aid the patient in the event of a mental health crisis (suicidal ideations/suicide attempt).  With written consent from the patient, the family member/significant other has been provided the following suicide prevention education, prior to the and/or following the discharge of the patient.  The suicide prevention education provided includes the following: Suicide risk factors Suicide prevention and interventions National Suicide Hotline telephone number [facility] assessment telephone number [facility] Emergency Assistance [place] and/or Residential Mobile Crisis Unit telephone number  Request made of family/significant other to: Remove weapons (e.g., guns, rifles, knives), all items previously/currently identified as safety concern.   Remove drugs/medications (over-the-counter, prescriptions, illicit drugs), all items previously/currently identified as a safety concern.  The family member/significant other verbalizes understanding of the suicide prevention education information provided.  The family member/significant other agrees to remove the items of safety concern listed above.  Sherre Lain 11/10/2022, 4:15 PM

## 2022-11-10 NOTE — BHH Counselor (Signed)
BHH/BMU LCSW Progress Note   11/10/2022    10:27 AM  [name]      Type of Note: Collateral Discharge Planning    CSW and the NP went to visit patient to get consents signed for safety planning and possible follow up appointments . Patient did agree to get reconnected with ACTT services when NP asked him again about it. CSW and NP then went to call patient mom and mom said that she did not know patient was in the hospital and will call the detectives to let them know where he is since their is a " missing person " report out on him. Mom being to say that she does not know what else to do with patient and hate that when she came to the hospital no one could confirm that patient was here; CSW began to explain the protocol of having patient consent, which patient gave today to reach out to support systems. Mom will return CSW call later because she was at work.     Signed:   Silas Flood, MSW, [facility] 11/10/2022 10:27 AM

## 2022-11-10 NOTE — BHH Group Notes (Signed)
[facility] Group Notes:  (Nursing/MHT/Case Management/Adjunct)  Date:  11/10/2022  Time:  8:29 PM  Type of Therapy:   AA  Participation Level:  Did Not Attend  Participation Quality:   na  Affect:   na  Cognitive:   na  Insight:  None  Engagement in Group:   na  Modes of Intervention:   na  Summary of Progress/Problems: Didn't attend.   Orvan Falconer 11/10/2022, 8:29 PM

## 2022-11-10 NOTE — Plan of Care (Signed)
  Problem: Safety: Goal: Periods of time without injury will increase Outcome: Progressing   

## 2022-11-10 NOTE — Progress Notes (Signed)
Steven Clayton  MRN:  [id number]  Reason for admission: 42 year old AA male with hx of metal illness & probable hx of substance use disorders. He is known in this [facility] with complain of worsening symptoms of Schizoaffective disorder, bipolar type. Steven was a Steven in this [facility] last in July of 2022. He is being admitted to [facility] this time around with complain of increased paranoia/delusional thinking. Steven apparently had called the cops seeking a protective custody as he felt there were some people after him. After the cops picked him-up, he was taken to the [facility] for evaluation & was later transferred to the [facility] for further psychiatric evaluation/treatments.   Daily notes: Steven Clayton is seen today in his room with the SW present. He was sitting up in his bed. He presents alert, oriented to self & situation. He is making a good eye contact & verbally responsive. However, his statements remain disorganized, illogical at times & tangential. He reports, "I'm feeling alright. I'm a little depressed because of all the things going on in my life & where I live at. I worry if I'm gonna be safe". When the social worker & this provider jointly asked Steven for a person in his life that knows him & a support system for him that we can call for collateral information, Steven named his mother Steven Clayton). He also cautioned to not use or mention the name Steven Clayton while talking to him. He thinks Steven Clayton is evil, has done him wrong. Also mentioned a person as his brother named Steven Clayton whom he does not want to deal with in any shape or form. When asked if he will consent to Act Team Services referral to have help with his medications & making his follow-up appointment after discharge? Steven became verbally aggressive. He accused the providers that his medicines "are being given to the [name] that does not make any sense". The SW & this provider were able to reach the mom briefly this  morning. Steven's mother states that Steven Clayton was reported missing as they did not know his where about for few days now. She was relieved that Steven is in the hospital. Mother indicated that she was at work then & can't talk much with the time she had. Will call Steven's mother tomorrow for further collateral information. Steven has consented to Act team referral. Steven remains visible on the unit, attending group sessions. He currently denies any SIHI, AVH. He remains paranoid & some what delusional. Will continue current plan of care as already in progress. Steven was started on Haldol 5 mg bid two days ago including cogentin 1 mg bid. The plan is to probably transition Steven to injectable form of haldol by discharge due to hx of non-compliance to his treatment regimen.  Reviewed vital signs, b/p slightly elevated 129/90. Steven is in no apparent distress. Will recheck. Reviewed current lab results, stable.  Principal Problem: Schizoaffective disorder, bipolar type ([place])  Diagnosis: Principal Problem:   Schizoaffective disorder, bipolar type ([place])  Total Time spent with Steven:  35 minutes  Past Psychiatric History: See H&P.  Past Medical History:  Past Medical History:  Diagnosis Date   Anxiety    Depression    Schizoaffective disorder ([place])    Stroke [facility])    History reviewed. No pertinent surgical history.  Family History:  Family History  Problem Relation Age of Onset   Heart disease Mother    Hyperlipidemia Mother    Mental  illness Father    Family Psychiatric  History: See H&P.  Social History:  Social History   Substance and Sexual Activity  Alcohol Use Yes   Alcohol/week: 1.0 standard drink of alcohol   Types: 1 Glasses of wine per week     Social History   Substance and Sexual Activity  Drug Use No    Social History   Socioeconomic History   Marital status: Single    Spouse name: Not on file   Number of children: Not on file   Years of education:  Not on file   Highest education level: Not on file  Occupational History   Not on file  Tobacco Use   Smoking status: Some Days    Packs/day: 2    Types: Cigarettes   Smokeless tobacco: Never  Vaping Use   Vaping Use: Never used  Substance and Sexual Activity   Alcohol use: Yes    Alcohol/week: 1.0 standard drink of alcohol    Types: 1 Glasses of wine per week   Drug use: No   Sexual activity: Never  Other Topics Concern   Not on file  Social History Narrative   Not on file   Social Determinants of Health   Financial Resource Strain: Not on file  Food Insecurity: No Food Insecurity (11/07/2022)   Hunger Vital Sign    Worried About Running Out of Food in the Last Year: Never true    Ran Out of Food in the Last Year: Never true  Transportation Needs: No Transportation Needs (11/07/2022)   PRAPARE - Hydrologist (Medical): No    Lack of Transportation (Non-Medical): No  Physical Activity: Not on file  Stress: Not on file  Social Connections: Not on file   Additional Social History:   Sleep: Good  Appetite:  Good  Current Medications: Current Facility-Administered Medications  Medication Dose Route Frequency Provider Last Rate Last Admin   acetaminophen (TYLENOL) tablet 650 mg  650 mg Oral Q6H PRN Rosezetta Schlatter, MD       alum & mag hydroxide-simeth (MAALOX/MYLANTA) 200-200-20 MG/5ML suspension 30 mL  30 mL Oral Q4H PRN Rosezetta Schlatter, MD       benztropine (COGENTIN) tablet 1 mg  1 mg Oral BID Lindell Spar I, NP   1 mg at 11/10/22 1630   diphenhydrAMINE (BENADRYL) capsule 50 mg  50 mg Oral TID PRN Rosezetta Schlatter, MD       Or   diphenhydrAMINE (BENADRYL) injection 50 mg  50 mg Intramuscular TID PRN Rosezetta Schlatter, MD       haloperidol (HALDOL) tablet 5 mg  5 mg Oral TID PRN Rosezetta Schlatter, MD       Or   haloperidol lactate (HALDOL) injection 5 mg  5 mg Intramuscular TID PRN Rosezetta Schlatter, MD       haloperidol (HALDOL) tablet 5 mg  5  mg Oral BID Lindell Spar I, NP   5 mg at 11/10/22 1630   hydrOXYzine (ATARAX) tablet 25 mg  25 mg Oral TID PRN Rosezetta Schlatter, MD       lisinopril (ZESTRIL) tablet 5 mg  5 mg Oral Daily Mohogany Toppins I, NP   5 mg at 11/10/22 0739   LORazepam (ATIVAN) tablet 2 mg  2 mg Oral TID PRN Rosezetta Schlatter, MD       Or   LORazepam (ATIVAN) injection 2 mg  2 mg Intramuscular TID PRN Rosezetta Schlatter, MD  magnesium hydroxide (MILK OF MAGNESIA) suspension 30 mL  30 mL Oral Daily PRN Rosezetta Schlatter, MD       nicotine (NICODERM CQ - dosed in mg/24 hours) patch 14 mg  14 mg Transdermal Daily Massengill, Nathan, MD   14 mg at 11/10/22 0739   traZODone (DESYREL) tablet 50 mg  50 mg Oral QHS PRN Rosezetta Schlatter, MD   50 mg at 11/09/22 2152   Lab Results:  Results for orders placed or performed during the hospital encounter of 11/07/22 (from the past 48 hour(s))  Culture, blood (Routine X 2) w Reflex to ID Panel     Status: None (Preliminary result)   Collection Time: 11/08/22  6:30 PM   Specimen: BLOOD  Result Value Ref Range   Specimen Description      BLOOD BLOOD RIGHT ARM Performed at [facility] [address], [place], [place] [address]    Special Requests      BOTTLES DRAWN AEROBIC ONLY Blood Culture adequate volume Performed at [facility] [address] [place], [place] [address]    Culture      NO GROWTH 2 DAYS Performed at [facility], [place] [address] [place], [place] [address]    Report Status PENDING   Culture, blood (Routine X 2) w Reflex to ID Panel     Status: None (Preliminary result)   Collection Time: 11/08/22  6:30 PM   Specimen: BLOOD RIGHT ARM  Result Value Ref Range   Specimen Description      BLOOD RIGHT ARM Performed at [facility], [place] [address], [place], [place] [address]    Special Requests      BOTTLES DRAWN AEROBIC ONLY Blood Culture adequate volume Performed at [facility]  [address], [place], [place] [address]    Culture      NO GROWTH 2 DAYS Performed at [facility], [place] [address] [place], [place] [address]    Report Status PENDING    Blood Alcohol level:  Lab Results  Component Value Date   [facility] <10 03/14/2021   ETH <5 [id number]   Metabolic Disorder Labs: Lab Results  Component Value Date   HGBA1C 5.5 11/07/2022   MPG 111 11/07/2022   MPG 111 03/17/2021   Lab Results  Component Value Date   PROLACTIN 33.3 (H) 03/17/2021   PROLACTIN 26.5 (H) 01/20/2015   Lab Results  Component Value Date   CHOL 210 (H) 11/07/2022   TRIG 144 11/07/2022   HDL 51 11/07/2022   CHOLHDL 4.1 11/07/2022   VLDL 29 11/07/2022   LDLCALC 130 (H) 11/07/2022   LDLCALC 102 (H) 03/17/2021   Physical Findings: AIMS:  , ,  ,  ,    CIWA:    COWS:     Musculoskeletal: Strength & Muscle Tone: within normal limits Gait & Station: normal Steven leans: N/A  Psychiatric Specialty Exam:  Presentation  General Appearance:  Disheveled  Eye Contact: Good  Speech: Clear and Coherent  Speech Volume: Other (comment) (Fluctuates)  Handedness: Right   Mood and Affect  Mood: Dysphoric  Affect: Congruent; Labile   Thought Process  Thought Processes: Disorganized  Descriptions of Associations:Tangential  Orientation:Full (Time, Place and Person)  Thought Content:Rumination; Tangential; Scattered  History of Schizophrenia/Schizoaffective disorder:Yes  Duration of Psychotic Symptoms:Greater than six months  Hallucinations:Description of Visual Hallucinations: NA   Ideas of Reference:Paranoia  Suicidal Thoughts:Suicidal Thoughts: No   Homicidal Thoughts:Homicidal Thoughts: No  Sensorium  Memory: Immediate Fair; Recent Fair; Remote Fair  Judgment: Impaired  Insight: Fair   Materials engineer: Fair  Attention Span: Fair  Recall: [employer] of Knowledge: --  (Limited)  Language: Fair  Psychomotor Activity  Psychomotor Activity: Psychomotor Activity: -- (Fluctuates)   Assets  Assets: Armed forces logistics/support/administrative officer; Desire for Improvement; Financial Resources/Insurance; Housing; Social Support; Resilience  Sleep  Sleep: Sleep: Good Number of Hours of Sleep: 7.5  Physical Exam: Physical Exam Vitals and nursing note reviewed.  HENT:     Head: Normocephalic.     Nose: Nose normal.     Mouth/Throat:     Pharynx: Oropharynx is clear.  Eyes:     Pupils: Pupils are equal, round, and reactive to light.  Cardiovascular:     Rate and Rhythm: Normal rate.     Pulses: Normal pulses.  Pulmonary:     Effort: Pulmonary effort is normal.  Genitourinary:    Comments: Deferred Musculoskeletal:        General: Normal range of motion.     Cervical back: Normal range of motion.  Skin:    General: Skin is warm and dry.  Neurological:     General: No focal deficit present.     Mental Status: He is alert and oriented to person, place, and time.    Review of Systems  Constitutional:  Negative for chills, diaphoresis, fever and malaise/fatigue.  HENT:  Negative for congestion and sore throat.   Eyes:  Negative for blurred vision.  Respiratory:  Negative for cough, shortness of breath and wheezing.   Cardiovascular:  Negative for chest pain and palpitations.  Gastrointestinal:  Negative for abdominal pain, diarrhea, heartburn, nausea and vomiting.  Genitourinary:  Negative for dysuria.  Musculoskeletal:  Negative for joint pain and myalgias.  Skin:  Negative for itching and rash.  Neurological:  Negative for dizziness, tingling, tremors, sensory change, speech change, focal weakness, seizures, loss of consciousness, weakness and headaches.  Psychiatric/Behavioral:  Positive for hallucinations. Negative for depression, memory loss, substance abuse and suicidal ideas. The Steven is nervous/anxious. The Steven does not have insomnia.    Blood pressure  (!) 132/94, pulse 82, temperature 98.1 F (36.7 C), temperature source Oral, resp. rate 18, height 5\' 9"  (1.753 m), weight 98.9 kg, SpO2 100 %. Body mass index is 32.19 kg/m.   Treatment Plan Summary: Daily contact with Steven to assess and evaluate symptoms and progress in treatment and Medication management.   Continue inpatient hospitalization.  Will continue today 11/10/2022 plan as below except where it is noted.   Principal/active diagnoses.  Schizoaffective disorder, bipolar type ([place]).    Associated symptoms.  Mania.  Agitation.  Grandiosity.  Restlessness.  Delusional thoughts.   Other medical issues.  Hypertension.  Plan:  -Continue Haldol 5 mg po bid for mood control.  -Benztropine 1 mg po bid for eps prevention.  -Continue hydroxyzine 25 mg po tid prn for anxiety. -Trazodone 50 mg po Q hs prn for insomnia.    Agitation protocols: Cont as recommended;  -Benadryl 50 mg po or IM tid prn. -Haldol 5 mg po or IM tid prn.  -Lorazepam 2 mg po or IM tid prn.   Other medical issues.  -Continue Lisinopril 5 mg po daily for HTN.  -administered Clonidine 0.2 mg po x once for elevated blood pressure - 178/120.   Other PRNS -Continue Tylenol 650 mg every 6 hours PRN for mild pain -Continue Maalox 30 ml Q 4 hrs PRN for indigestion -  Continue MOM 30 ml po Q 6 hrs for constipation   Safety and Monitoring: Voluntary admission to inpatient psychiatric unit for safety, stabilization and treatment Daily contact with Steven to assess and evaluate symptoms and progress in treatment Steven's case to be discussed in multi-disciplinary team meeting Observation Level : q15 minute checks Vital signs: q12 hours Precautions: Safety   Discharge Planning: Social work and case management to assist with discharge planning and identification of hospital follow-up needs prior to discharge Estimated LOS: 5-7 days Discharge Concerns: Need to establish a safety plan; Medication compliance  and effectiveness Discharge Goals: Return home with outpatient referrals for mental health follow-up including medication management/psychotherapy  Lindell Spar, NP, pmhnp, fnp-bc 11/10/2022, 4:39 PMPatient ID: Steele Clayton, male   DOB: 1981/03/13, 42 y.o.   MRN: [id number]

## 2022-11-11 DIAGNOSIS — F250 Schizoaffective disorder, bipolar type: Secondary | ICD-10-CM | POA: Diagnosis not present

## 2022-11-11 MED ORDER — HALOPERIDOL 5 MG PO TABS
10.0000 mg | ORAL_TABLET | Freq: Two times a day (BID) | ORAL | Status: DC
  Administered 2022-11-11 – 2022-11-20 (×18): 10 mg via ORAL
  Filled 2022-11-11 (×12): qty 2
  Filled 2022-11-11: qty 28
  Filled 2022-11-11 (×10): qty 2
  Filled 2022-11-11: qty 28

## 2022-11-11 MED ORDER — BENZTROPINE MESYLATE 1 MG PO TABS
1.0000 mg | ORAL_TABLET | Freq: Two times a day (BID) | ORAL | Status: DC
  Administered 2022-11-11 – 2022-11-20 (×18): 1 mg via ORAL
  Filled 2022-11-11 (×3): qty 1
  Filled 2022-11-11: qty 14
  Filled 2022-11-11 (×18): qty 1
  Filled 2022-11-11: qty 14

## 2022-11-11 MED ORDER — LISINOPRIL 10 MG PO TABS
10.0000 mg | ORAL_TABLET | Freq: Every day | ORAL | Status: DC
  Administered 2022-11-12 – 2022-11-20 (×9): 10 mg via ORAL
  Filled 2022-11-11 (×8): qty 1
  Filled 2022-11-11: qty 7
  Filled 2022-11-11 (×2): qty 1

## 2022-11-11 MED ORDER — LISINOPRIL 5 MG PO TABS
5.0000 mg | ORAL_TABLET | Freq: Once | ORAL | Status: AC
  Administered 2022-11-11: 5 mg via ORAL
  Filled 2022-11-11: qty 1

## 2022-11-11 NOTE — BHH Group Notes (Signed)
[facility] Group Notes:  (Nursing/MHT/Case Management/Adjunct)  Date:  11/11/2022  Time:  9:30 PM  Type of Therapy:  Group Therapy  Participation Level:  Active  Participation Quality:  Appropriate  Affect:  Appropriate  Cognitive:  Appropriate  Insight:  Appropriate  Engagement in Group:  Engaged  Modes of Intervention:  Education  Summary of Progress/Problems: Goal to take medication. Day 6/10.  Orvan Falconer 11/11/2022, 9:30 PM

## 2022-11-11 NOTE — Progress Notes (Signed)
Pt appears to be responding to internal stimuli at night.  Last night and tonight as charge nurse I have witnessed Pt talking in room loudly when no one is in the room except Pt and Pt has reported happenings such as other Pt's making noise or talking to him that have not been witnessed by staff.  Pt alone in room, peer in bed asleep that Pt thought was bothering him.  This appears to worsen each night as the night goes on.  Both last night and tonight Pt has been observed talking in room loudly while alone, and has become increasingly agitated.  Pt has received agitation medication each night.

## 2022-11-11 NOTE — Progress Notes (Signed)
Pt seen talking to himself in the room, pt then came out complaining  about a peer screaming and being rude to him although nobody was screaming or talking to him at the time. Pt sated he can not get sleep because there is so much noise, pt given ear plugs which pt reported not helping. Pt started to become agitated and required to be medicated  with ativan and benadryl. Pt is calm at the moment, but is back and forth between his room and  nurses station. Will continue to monitor.

## 2022-11-11 NOTE — Progress Notes (Signed)
[facility] MD Progress Note  11/11/2022 2:48 PM Steven Clayton  MRN:  [id number]  Reason for admission: 42 year old AA male with hx of metal illness & probable hx of substance use disorders. He is known in this [facility] with complain of worsening symptoms of Schizoaffective disorder, bipolar type. Patient was a patient in this [facility] last in July of 2022. He is being admitted to [facility] this time around with complain of increased paranoia/delusional thinking. Patient apparently had called the cops seeking a protective custody as he felt there were some people after him. After the cops picked him-up, he was taken to the [facility] for evaluation & was later transferred to the [facility] for further psychiatric evaluation/treatments.   Daily notes: Steven Clayton is seen, chart reviewed. The chart findings discussed with the treatment team. He presents alert, oriented to self/situation & visible on the unit, attending group sessions. Steven Clayton presents civil during this evaluation. There are no verbal aggressiveness noted. There are no agitation present. He says he is doing well on his medications. He currently denies any side effects. He says he slept well last night". Patient's haldol is increased to 10 mg po bid for mood control. The plan is to transition him to the injectable form of Haldol on a monthly basis by discharge. Increased Lisinopril to 10 mg po daily for elevated blood pressure. Patient is given a dose of Lisinopril 5 mg po once today. Blood pressure will be re-checked. He currently denies any SIHI, AVH, delusional thoughts or paranoia. He does not appear to be responding to any internal stimuli. Will continue current plan of care as already in progress.   This provider had a brief phone conversation with patient's mother Steven Clayton). She pleaded that Steven Clayton should be given an injectable form of any medications he is getting here upon discharge as he is known to be non-compliance to his treatment regimen. She also requested for an  Act team referral to help him manage his mental health after discharge. Patient's mother is informed that the social worker is aware & working on the Act Team referral.  Principal Problem: Schizoaffective disorder, bipolar type ([place])  Diagnosis: Principal Problem:   Schizoaffective disorder, bipolar type ([place])  Total Time spent with patient:  35 minutes  Past Psychiatric History: See H&P.  Past Medical History:  Past Medical History:  Diagnosis Date   Anxiety    Depression    Schizoaffective disorder ([place])    Stroke [facility])    History reviewed. No pertinent surgical history.  Family History:  Family History  Problem Relation Age of Onset   Heart disease Mother    Hyperlipidemia Mother    Mental illness Father    Family Psychiatric  History: See H&P.  Social History:  Social History   Substance and Sexual Activity  Alcohol Use Yes   Alcohol/week: 1.0 standard drink of alcohol   Types: 1 Glasses of wine per week     Social History   Substance and Sexual Activity  Drug Use No    Social History   Socioeconomic History   Marital status: Single    Spouse name: Not on file   Number of children: Not on file   Years of education: Not on file   Highest education level: Not on file  Occupational History   Not on file  Tobacco Use   Smoking status: Some Days    Packs/day: 2    Types: Cigarettes   Smokeless tobacco: Never  Vaping Use  Vaping Use: Never used  Substance and Sexual Activity   Alcohol use: Yes    Alcohol/week: 1.0 standard drink of alcohol    Types: 1 Glasses of wine per week   Drug use: No   Sexual activity: Never  Other Topics Concern   Not on file  Social History Narrative   Not on file   Social Determinants of Health   Financial Resource Strain: Not on file  Food Insecurity: No Food Insecurity (11/07/2022)   Hunger Vital Sign    Worried About Running Out of Food in the Last Year: Never true    Ran Out of Food in the Last Year: Never true   Transportation Needs: No Transportation Needs (11/07/2022)   PRAPARE - Hydrologist (Medical): No    Lack of Transportation (Non-Medical): No  Physical Activity: Not on file  Stress: Not on file  Social Connections: Not on file   Additional Social History:   Sleep: Good  Appetite:  Good  Current Medications: Current Facility-Administered Medications  Medication Dose Route Frequency Provider Last Rate Last Admin   acetaminophen (TYLENOL) tablet 650 mg  650 mg Oral Q6H PRN Rosezetta Schlatter, MD       alum & mag hydroxide-simeth (MAALOX/MYLANTA) 200-200-20 MG/5ML suspension 30 mL  30 mL Oral Q4H PRN Rosezetta Schlatter, MD       benztropine (COGENTIN) tablet 1 mg  1 mg Oral BID Daphna Lafuente I, NP       diphenhydrAMINE (BENADRYL) capsule 50 mg  50 mg Oral TID PRN Rosezetta Schlatter, MD   50 mg at 11/10/22 1945   Or   diphenhydrAMINE (BENADRYL) injection 50 mg  50 mg Intramuscular TID PRN Rosezetta Schlatter, MD       haloperidol (HALDOL) tablet 10 mg  10 mg Oral BID Lindell Spar I, NP       haloperidol (HALDOL) tablet 5 mg  5 mg Oral TID PRN Rosezetta Schlatter, MD   5 mg at 11/10/22 1945   Or   haloperidol lactate (HALDOL) injection 5 mg  5 mg Intramuscular TID PRN Rosezetta Schlatter, MD       hydrOXYzine (ATARAX) tablet 25 mg  25 mg Oral TID PRN Rosezetta Schlatter, MD       Derrill Memo ON 11/12/2022] lisinopril (ZESTRIL) tablet 10 mg  10 mg Oral Daily Janasha Barkalow I, NP       lisinopril (ZESTRIL) tablet 5 mg  5 mg Oral Once Lindell Spar I, NP       LORazepam (ATIVAN) tablet 2 mg  2 mg Oral TID PRN Rosezetta Schlatter, MD   2 mg at 11/10/22 1945   Or   LORazepam (ATIVAN) injection 2 mg  2 mg Intramuscular TID PRN Rosezetta Schlatter, MD       magnesium hydroxide (MILK OF MAGNESIA) suspension 30 mL  30 mL Oral Daily PRN Rosezetta Schlatter, MD       nicotine (NICODERM CQ - dosed in mg/24 hours) patch 14 mg  14 mg Transdermal Daily Massengill, Ovid Curd, MD   14 mg at 11/11/22 0732   traZODone  (DESYREL) tablet 50 mg  50 mg Oral QHS PRN Rosezetta Schlatter, MD   50 mg at 11/10/22 2032   Lab Results:  No results found for this or any previous visit (from the past 48 hour(s)).  Blood Alcohol level:  Lab Results  Component Value Date   [facility] <10 03/14/2021   ETH <5 [id number]   Metabolic Disorder Labs: Lab Results  Component Value Date   HGBA1C 5.5 11/07/2022   MPG 111 11/07/2022   MPG 111 03/17/2021   Lab Results  Component Value Date   PROLACTIN 33.3 (H) 03/17/2021   PROLACTIN 26.5 (H) 01/20/2015   Lab Results  Component Value Date   CHOL 210 (H) 11/07/2022   TRIG 144 11/07/2022   HDL 51 11/07/2022   CHOLHDL 4.1 11/07/2022   VLDL 29 11/07/2022   LDLCALC 130 (H) 11/07/2022   LDLCALC 102 (H) 03/17/2021   Physical Findings: AIMS:  , ,  ,  ,    CIWA:    COWS:     Musculoskeletal: Strength & Muscle Tone: within normal limits Gait & Station: normal Patient leans: N/A  Psychiatric Specialty Exam:  Presentation  General Appearance:  Disheveled  Eye Contact: Good  Speech: Clear and Coherent  Speech Volume: Other (comment) (Fluctuates)  Handedness: Right   Mood and Affect  Mood: Dysphoric  Affect: Congruent; Labile   Thought Process  Thought Processes: Disorganized  Descriptions of Associations:Tangential  Orientation:Full (Time, Place and Person)  Thought Content:Rumination; Tangential; Scattered  History of Schizophrenia/Schizoaffective disorder:Yes  Duration of Psychotic Symptoms:Greater than six months  Hallucinations:Description of Visual Hallucinations: NA   Ideas of Reference:Paranoia  Suicidal Thoughts:Suicidal Thoughts: No   Homicidal Thoughts:Homicidal Thoughts: No    Sensorium  Memory: Immediate Fair; Recent Fair; Remote Fair  Judgment: Impaired  Insight: Fair   Materials engineer: Fair  Attention Span: Fair  Recall: [employer] of Knowledge: --  (Limited)  Language: Fair  Psychomotor Activity  Psychomotor Activity: Psychomotor Activity: -- (Fluctuates)   Assets  Assets: Armed forces logistics/support/administrative officer; Desire for Improvement; Financial Resources/Insurance; Housing; Social Support; Resilience  Sleep  Sleep: Sleep: Good Number of Hours of Sleep: 7.5  Physical Exam: Physical Exam Vitals and nursing note reviewed.  HENT:     Head: Normocephalic.     Nose: Nose normal.     Mouth/Throat:     Pharynx: Oropharynx is clear.  Eyes:     Pupils: Pupils are equal, round, and reactive to light.  Cardiovascular:     Rate and Rhythm: Normal rate.     Pulses: Normal pulses.  Pulmonary:     Effort: Pulmonary effort is normal.  Genitourinary:    Comments: Deferred Musculoskeletal:        General: Normal range of motion.     Cervical back: Normal range of motion.  Skin:    General: Skin is warm and dry.  Neurological:     General: No focal deficit present.     Mental Status: He is alert and oriented to person, place, and time.    Review of Systems  Constitutional:  Negative for chills, diaphoresis, fever and malaise/fatigue.  HENT:  Negative for congestion and sore throat.   Eyes:  Negative for blurred vision.  Respiratory:  Negative for cough, shortness of breath and wheezing.   Cardiovascular:  Negative for chest pain and palpitations.  Gastrointestinal:  Negative for abdominal pain, diarrhea, heartburn, nausea and vomiting.  Genitourinary:  Negative for dysuria.  Musculoskeletal:  Negative for joint pain and myalgias.  Skin:  Negative for itching and rash.  Neurological:  Negative for dizziness, tingling, tremors, sensory change, speech change, focal weakness, seizures, loss of consciousness, weakness and headaches.  Psychiatric/Behavioral:  Positive for hallucinations. Negative for depression, memory loss, substance abuse and suicidal ideas. The patient is nervous/anxious. The patient does not have insomnia.    Blood pressure  (!) 132/97, pulse 94, temperature 97.7  F (36.5 C), resp. rate 16, height 5\' 9"  (1.753 m), weight 98.9 kg, SpO2 98 %. Body mass index is 32.19 kg/m.  Treatment Plan Summary: Daily contact with patient to assess and evaluate symptoms and progress in treatment and Medication management.   Continue inpatient hospitalization.  Will continue today 11/11/2022 plan as below except where it is noted.   Principal/active diagnoses.  Schizoaffective disorder, bipolar type ([place]).    Associated symptoms.  Mania.  Agitation.  Grandiosity.  Restlessness.  Delusional thoughts.   Other medical issues.  Hypertension.  Plan:  -Increased Haldol from 5 mg to 10 mg po bid for mood control.  -Continue Benztropine 1 mg po bid for eps prevention.  -Continue hydroxyzine 25 mg po tid prn for anxiety. -Continue Trazodone 50 mg po Q hs prn for insomnia.    Agitation protocols: Cont as recommended;  -Benadryl 50 mg po or IM tid prn. -Haldol 5 mg po or IM tid prn.  -Lorazepam 2 mg po or IM tid prn.   Other medical issues.  -Increased Lisinopril from 5 mg to 10 mg po daily for HTN.  -administered Clonidine 0.2 mg po x once for elevated blood pressure - 178/120.   Other PRNS -Continue Tylenol 650 mg every 6 hours PRN for mild pain -Continue Maalox 30 ml Q 4 hrs PRN for indigestion -Continue MOM 30 ml po Q 6 hrs for constipation   Safety and Monitoring: Voluntary admission to inpatient psychiatric unit for safety, stabilization and treatment Daily contact with patient to assess and evaluate symptoms and progress in treatment Patient's case to be discussed in multi-disciplinary team meeting Observation Level : q15 minute checks Vital signs: q12 hours Precautions: Safety   Discharge Planning: Social work and case management to assist with discharge planning and identification of hospital follow-up needs prior to discharge Estimated LOS: 5-7 days Discharge Concerns: Need to establish a safety plan;  Medication compliance and effectiveness Discharge Goals: Return home with outpatient referrals for mental health follow-up including medication management/psychotherapy  Lindell Spar, NP, pmhnp, fnp-bc 11/11/2022, 2:48 PMPatient ID: Steven Clayton, male   DOB: 03-Mar-1981, 42 y.o.   MRN: [id number] Patient ID: Steven Clayton, male   DOB: 1981/04/14, 42 y.o.   MRN: [id number]

## 2022-11-11 NOTE — Group Note (Signed)
Date:  11/11/2022 Time:  2:52 PM  Group Topic/Focus:  Goals Group:   The focus of this group is to help patients establish daily goals to achieve during treatment and discuss how the patient can incorporate goal setting into their daily lives to aide in recovery. Orientation:   The focus of this group is to educate the patient on the purpose and policies of crisis stabilization and provide a format to answer questions about their admission.  The group details unit policies and expectations of patients while admitted.    Participation Level:  Active  Participation Quality:  Attentive  Affect:  Appropriate  Cognitive:  Appropriate  Insight: Appropriate  Engagement in Group:  Engaged  Modes of Intervention:  Discussion  Additional Comments:  Patient attended group and was attentive the duration of it.   Talib Headley T Ria Comment 11/11/2022, 2:52 PM

## 2022-11-11 NOTE — Progress Notes (Signed)
Restful night without further occurrences.  Will continue to monitor.

## 2022-11-11 NOTE — Progress Notes (Signed)
Pt attending group. Pt compliant with medications. Pt reports good sleep last night. Pt reports conflict with peer from last night is resolved. Nurse encouraged pt to speak with staff with any further concerns. Pt denies SI/HI/self harm thoughts as well as a/v hallucinations. Q 15 minute checks ongoing.

## 2022-11-12 DIAGNOSIS — F250 Schizoaffective disorder, bipolar type: Secondary | ICD-10-CM | POA: Diagnosis not present

## 2022-11-12 MED ORDER — CLONIDINE HCL 0.1 MG PO TABS
0.1000 mg | ORAL_TABLET | Freq: Once | ORAL | Status: AC
  Administered 2022-11-12: 0.1 mg via ORAL
  Filled 2022-11-12 (×2): qty 1

## 2022-11-12 NOTE — Group Note (Signed)
Date:  11/12/2022 Time:  6:38 PM  Group Topic/Focus:  Personal Choices and Values:   The focus of this group is to help patients assess and explore the importance of values in their lives, how their values affect their decisions, how they express their values and what opposes their expression.    Participation Level:  Minimal  Participation Quality:  Attentive  Affect:  Flat  Cognitive:  Lacking  Insight: Lacking  Engagement in Group:  Limited  Modes of Intervention:  Exploration  Additional Comments:     Jerrye Beavers 11/12/2022, 6:38 PM

## 2022-11-12 NOTE — Plan of Care (Signed)
  Problem: Safety: Goal: Periods of time without injury will increase Outcome: Progressing   

## 2022-11-12 NOTE — Group Note (Signed)
Date:  11/12/2022 Time:  6:36 PM  Group Topic/Focus:  Dimensions of Wellness:   The focus of this group is to introduce the topic of wellness and discuss the role each dimension of wellness plays in total health.    Participation Level:  Active  Participation Quality:  Inattentive  Affect:  Appropriate  Cognitive:  Appropriate  Insight: Good  Engagement in Group:  Limited  Modes of Intervention:  Education  Additional Comments:     Jerrye Beavers 11/12/2022, 6:36 PM

## 2022-11-12 NOTE — Group Note (Signed)
Date:  11/12/2022 Time:  6:28 PM  Group Topic/Focus:  Orientation:   The focus of this group is to educate the patient on the purpose and policies of crisis stabilization and provide a format to answer questions about their admission.  The group details unit policies and expectations of patients while admitted.    Participation Level:  Active  Participation Quality:  Appropriate  Affect:  Blunted  Cognitive:  Appropriate  Insight: Appropriate  Engagement in Group:  Engaged  Modes of Intervention:  Discussion  Additional Comments:     Jerrye Beavers 11/12/2022, 6:28 PM

## 2022-11-12 NOTE — Progress Notes (Signed)
[facility] MD Progress Note  11/12/2022 12:48 PM Steven Clayton  MRN:  [id number]  Reason for admission: 42 year old AA male with hx of metal illness & probable hx of substance use disorders. He is known in this [facility] with complain of worsening symptoms of Schizoaffective disorder, bipolar type. Patient was a patient in this [facility] last in July of 2022. He is being admitted to [facility] this time around with complain of increased paranoia/delusional thinking. Patient apparently had called the cops seeking a protective custody as he felt there were some people after him. After the cops picked him-up, he was taken to the [facility] for evaluation & was later transferred to the [facility] for further psychiatric evaluation/treatments.   Daily notes: Steven Clayton is seen in his room, chart reviewed. The chart findings discussed with the treatment team. He is lying down in bed. He presents alert, oriented to self/situation & visible on the unit, attending group sessions. Again,Steven Clayton presents civil during this evaluation. There are no verbal aggressiveness noted. There are no agitation present. He says he is doing well on his medications. However, staff reports in the last two evenings that patient received the agitation protocols due to verbal aggression & hallucinations. He appears calmer & relatable during the daytime. He is attending group sessions. He currently denies any complaints or medication side effects. He says he slept well last night. Patient's haldol dose was increased to 10 mg po bid yesterday for mood control. The plan is to transition him to the injectable form of Haldol on a monthly basis by discharge. Increased his Lisinopril to 10 mg po daily to elevated blood pressure. His vital signs reviewed, b/p: 118/95. Will give a dose of Clonidine 0.1 mg po once today. Then will continue current plan of care as already in progress below.  Previously: This provider had a brief phone conversation with patient's mother [name]).  She pleaded that Steven Clayton should be given an injectable form of any medications he is getting here upon discharge as he is known to be non-compliance to his treatment regimen. She also requested for an Act team referral to help him manage his mental health after discharge. Patient's mother is informed that the social worker is aware & working on the Act Team referral.  Principal Problem: Schizoaffective disorder, bipolar type ([place])  Diagnosis: Principal Problem:   Schizoaffective disorder, bipolar type ([place])  Total Time spent with patient:  35 minutes  Past Psychiatric History: See H&P.  Past Medical History:  Past Medical History:  Diagnosis Date   Anxiety    Depression    Schizoaffective disorder ([place])    Stroke [facility])    History reviewed. No pertinent surgical history.  Family History:  Family History  Problem Relation Age of Onset   Heart disease Mother    Hyperlipidemia Mother    Mental illness Father    Family Psychiatric  History: See H&P.  Social History:  Social History   Substance and Sexual Activity  Alcohol Use Yes   Alcohol/week: 1.0 standard drink of alcohol   Types: 1 Glasses of wine per week     Social History   Substance and Sexual Activity  Drug Use No    Social History   Socioeconomic History   Marital status: Single    Spouse name: Not on file   Number of children: Not on file   Years of education: Not on file   Highest education level: Not on file  Occupational History   Not on file  Tobacco Use   Smoking status: Some Days    Packs/day: 2    Types: Cigarettes   Smokeless tobacco: Never  Vaping Use   Vaping Use: Never used  Substance and Sexual Activity   Alcohol use: Yes    Alcohol/week: 1.0 standard drink of alcohol    Types: 1 Glasses of wine per week   Drug use: No   Sexual activity: Never  Other Topics Concern   Not on file  Social History Narrative   Not on file   Social Determinants of Health   Financial Resource Strain:  Not on file  Food Insecurity: No Food Insecurity (11/07/2022)   Hunger Vital Sign    Worried About Running Out of Food in the Last Year: Never true    Ran Out of Food in the Last Year: Never true  Transportation Needs: No Transportation Needs (11/07/2022)   PRAPARE - Hydrologist (Medical): No    Lack of Transportation (Non-Medical): No  Physical Activity: Not on file  Stress: Not on file  Social Connections: Not on file   Additional Social History:   Sleep: Good  Appetite:  Good  Current Medications: Current Facility-Administered Medications  Medication Dose Route Frequency Provider Last Rate Last Admin   acetaminophen (TYLENOL) tablet 650 mg  650 mg Oral Q6H PRN Rosezetta Schlatter, MD       alum & mag hydroxide-simeth (MAALOX/MYLANTA) 200-200-20 MG/5ML suspension 30 mL  30 mL Oral Q4H PRN Rosezetta Schlatter, MD       benztropine (COGENTIN) tablet 1 mg  1 mg Oral BID Lindell Spar I, NP   1 mg at 11/12/22 0747   cloNIDine (CATAPRES) tablet 0.1 mg  0.1 mg Oral Once Lindell Spar I, NP       diphenhydrAMINE (BENADRYL) capsule 50 mg  50 mg Oral TID PRN Rosezetta Schlatter, MD   50 mg at 11/11/22 2238   Or   diphenhydrAMINE (BENADRYL) injection 50 mg  50 mg Intramuscular TID PRN Rosezetta Schlatter, MD       haloperidol (HALDOL) tablet 10 mg  10 mg Oral BID Lindell Spar I, NP   10 mg at 11/12/22 0747   haloperidol (HALDOL) tablet 5 mg  5 mg Oral TID PRN Rosezetta Schlatter, MD   5 mg at 11/10/22 1945   Or   haloperidol lactate (HALDOL) injection 5 mg  5 mg Intramuscular TID PRN Rosezetta Schlatter, MD       hydrOXYzine (ATARAX) tablet 25 mg  25 mg Oral TID PRN Rosezetta Schlatter, MD   25 mg at 11/11/22 2217   lisinopril (ZESTRIL) tablet 10 mg  10 mg Oral Daily Lindell Spar I, NP   10 mg at 11/12/22 0747   LORazepam (ATIVAN) tablet 2 mg  2 mg Oral TID PRN Rosezetta Schlatter, MD   2 mg at 11/11/22 2238   Or   LORazepam (ATIVAN) injection 2 mg  2 mg Intramuscular TID PRN Rosezetta Schlatter,  MD       magnesium hydroxide (MILK OF MAGNESIA) suspension 30 mL  30 mL Oral Daily PRN Rosezetta Schlatter, MD       nicotine (NICODERM CQ - dosed in mg/24 hours) patch 14 mg  14 mg Transdermal Daily Massengill, Ovid Curd, MD   14 mg at 11/12/22 0747   traZODone (DESYREL) tablet 50 mg  50 mg Oral QHS PRN Rosezetta Schlatter, MD   50 mg at 11/11/22 2122   Lab Results:  No results found for this or  any previous visit (from the past 48 hour(s)).  Blood Alcohol level:  Lab Results  Component Value Date   ETH <10 03/14/2021   ETH <5 [id number]   Metabolic Disorder Labs: Lab Results  Component Value Date   HGBA1C 5.5 11/07/2022   MPG 111 11/07/2022   MPG 111 03/17/2021   Lab Results  Component Value Date   PROLACTIN 33.3 (H) 03/17/2021   PROLACTIN 26.5 (H) 01/20/2015   Lab Results  Component Value Date   CHOL 210 (H) 11/07/2022   TRIG 144 11/07/2022   HDL 51 11/07/2022   CHOLHDL 4.1 11/07/2022   VLDL 29 11/07/2022   LDLCALC 130 (H) 11/07/2022   LDLCALC 102 (H) 03/17/2021   Physical Findings: AIMS:  , ,  ,  ,    CIWA:    COWS:     Musculoskeletal: Strength & Muscle Tone: within normal limits Gait & Station: normal Patient leans: N/A  Psychiatric Specialty Exam:  Presentation  General Appearance:  Disheveled  Eye Contact: Good  Speech: Clear and Coherent; Normal Rate  Speech Volume: Normal  Handedness: Right   Mood and Affect  Mood: -- (Improving)  Affect: Congruent   Thought Process  Thought Processes: Coherent  Descriptions of Associations:Intact  Orientation:Full (Time, Place and Person)  Thought Content:Logical  History of Schizophrenia/Schizoaffective disorder:Yes  Duration of Psychotic Symptoms:Greater than six months  Hallucinations:Hallucinations: None Description of Visual Hallucinations: NA    Ideas of Reference:None  Suicidal Thoughts:Suicidal Thoughts: No    Homicidal Thoughts:Homicidal Thoughts: No     Sensorium   Memory: Immediate Fair; Recent Fair; Remote Poor  Judgment: Fair  Insight: Fair   Materials engineer: Fair  Attention Span: Fair  Recall: [employer] of Knowledge: Fair  Language: Fair  Psychomotor Activity  Psychomotor Activity: Psychomotor Activity: Normal    Assets  Assets: Desire for Improvement; Communication Skills; Financial Resources/Insurance; Housing; Social Support; Resilience; Physical Health  Sleep  Sleep: Sleep: Good Number of Hours of Sleep: 8   Physical Exam: Physical Exam Vitals and nursing note reviewed.  HENT:     Head: Normocephalic.     Nose: Nose normal.     Mouth/Throat:     Pharynx: Oropharynx is clear.  Eyes:     Pupils: Pupils are equal, round, and reactive to light.  Cardiovascular:     Rate and Rhythm: Normal rate.     Pulses: Normal pulses.  Pulmonary:     Effort: Pulmonary effort is normal.  Genitourinary:    Comments: Deferred Musculoskeletal:        General: Normal range of motion.     Cervical back: Normal range of motion.  Skin:    General: Skin is warm and dry.  Neurological:     General: No focal deficit present.     Mental Status: He is alert and oriented to person, place, and time.    Review of Systems  Constitutional:  Negative for chills, diaphoresis, fever and malaise/fatigue.  HENT:  Negative for congestion and sore throat.   Eyes:  Negative for blurred vision.  Respiratory:  Negative for cough, shortness of breath and wheezing.   Cardiovascular:  Negative for chest pain and palpitations.  Gastrointestinal:  Negative for abdominal pain, diarrhea, heartburn, nausea and vomiting.  Genitourinary:  Negative for dysuria.  Musculoskeletal:  Negative for joint pain and myalgias.  Skin:  Negative for itching and rash.  Neurological:  Negative for dizziness, tingling, tremors, sensory change, speech change, focal weakness, seizures, loss of  consciousness, weakness and headaches.   Psychiatric/Behavioral:  Positive for hallucinations. Negative for depression, memory loss, substance abuse and suicidal ideas. The patient is nervous/anxious. The patient does not have insomnia.    Blood pressure (!) 118/95, pulse 98, temperature 97.7 F (36.5 C), resp. rate 18, height 5\' 9"  (1.753 m), weight 98.9 kg, SpO2 98 %. Body mass index is 32.19 kg/m.  Treatment Plan Summary: Daily contact with patient to assess and evaluate symptoms and progress in treatment and Medication management.   Continue inpatient hospitalization.  Will continue today 11/12/2022 plan as below except where it is noted.   Principal/active diagnoses.  Schizoaffective disorder, bipolar type ([place]).    Associated symptoms.  Mania.  Agitation.  Grandiosity.  Restlessness.  Delusional thoughts.   Other medical issues.  Hypertension.  Plan:  -Continue Haldol 10 mg po bid for mood control.  -Continue Benztropine 1 mg po bid for eps prevention.  -Continue hydroxyzine 25 mg po tid prn for anxiety. -Continue Trazodone 50 mg po Q hs prn for insomnia.    Agitation protocols: Cont as recommended;  -Benadryl 50 mg po or IM tid prn. -Haldol 5 mg po or IM tid prn.  -Lorazepam 2 mg po or IM tid prn.   Other medical issues.  -Continue Lisinopril from 10 mg po daily for HTN.  -administered Clonidine 0.2 mg po x once for elevated blood pressure - 178/120.   Other PRNS -Continue Tylenol 650 mg every 6 hours PRN for mild pain -Continue Maalox 30 ml Q 4 hrs PRN for indigestion -Continue MOM 30 ml po Q 6 hrs for constipation   Safety and Monitoring: Voluntary admission to inpatient psychiatric unit for safety, stabilization and treatment Daily contact with patient to assess and evaluate symptoms and progress in treatment Patient's case to be discussed in multi-disciplinary team meeting Observation Level : q15 minute checks Vital signs: q12 hours Precautions: Safety   Discharge Planning: Social work and  case management to assist with discharge planning and identification of hospital follow-up needs prior to discharge Estimated LOS: 5-7 days Discharge Concerns: Need to establish a safety plan; Medication compliance and effectiveness Discharge Goals: Return home with outpatient referrals for mental health follow-up including medication management/psychotherapy  Lindell Spar, NP, pmhnp, fnp-bc 11/12/2022, 12:48 PM Patient ID: Steven Clayton, male   DOB: 07-15-1981, 42 y.o.   MRN: [id number] Patient ID: Steven Clayton, male   DOB: 06/07/1981, 42 y.o.   MRN: [id number] Patient ID: Steven Clayton, male   DOB: 1981-05-20, 42 y.o.   MRN: [id number]

## 2022-11-12 NOTE — [facility] Group Notes (Signed)
[place] Group Notes:  (Nursing/MHT/Case Management/Adjunct)  Date:  11/12/2022  Time:  9:17 PM  Type of Therapy:  Group Therapy  Participation Level:  Active  Participation Quality:  Appropriate  Affect:  Appropriate  Cognitive:  Appropriate  Insight:  Appropriate  Engagement in Group:  Engaged  Modes of Intervention:  Education  Summary of Progress/Problems: Goal to take his meds. Day 7/10.  Orvan Falconer 11/12/2022, 9:17 PM

## 2022-11-12 NOTE — Progress Notes (Signed)
Pt in room resting this morning. Pt reports he did not sleep enough last night. Pt endorses auditory hallucinations this morning. Pt states voices heard are negative and non commanding. Pt presents with irritable affect and poor eye contact. Pt denies SI/HI/self harm thoughts. Pt given food and fluids per request. Q 15 minute checks ongoing for safety.

## 2022-11-12 NOTE — Progress Notes (Signed)
   11/12/22 2000  Psych Admission Type (Psych Patients Only)  Admission Status Voluntary  Psychosocial Assessment  Patient Complaints None  Eye Contact Brief  Facial Expression Animated  Affect Anxious  Speech Unremarkable  Interaction Assertive  Motor Activity Fidgety  Appearance/Hygiene Disheveled  Behavior Characteristics Cooperative  Thought Process  Coherency WDL  Content WDL  Delusions None reported or observed  Perception WDL  Hallucination None reported or observed  Judgment Limited  Confusion None  Danger to Self  Current suicidal ideation? Denies   Alert/oriented. Makes needs/concerns known to staff. Pleasant cooperative with staff. Denies SI/HI/AV/hallucinations. Will encourage compliance and progression towards goals. Verbally contracted for safety. Will continue to monitor.

## 2022-11-12 NOTE — Progress Notes (Signed)
   11/11/22 2100  Psych Admission Type (Psych Patients Only)  Admission Status Voluntary  Psychosocial Assessment  Patient Complaints None  Eye Contact Fair  Facial Expression Animated  Affect Appropriate to circumstance  Speech Logical/coherent  Interaction Assertive  Motor Activity Slow  Appearance/Hygiene Disheveled  Behavior Characteristics Cooperative;Appropriate to situation  Mood Pleasant  Thought Process  Coherency WDL  Content WDL  Delusions None reported or observed  Perception WDL  Hallucination None reported or observed  Judgment Limited  Confusion None  Danger to Self  Current suicidal ideation? Denies  Danger to Others  Danger to Others None reported or observed

## 2022-11-13 ENCOUNTER — Encounter (HOSPITAL_COMMUNITY): Payer: Self-pay

## 2022-11-13 DIAGNOSIS — F250 Schizoaffective disorder, bipolar type: Secondary | ICD-10-CM | POA: Diagnosis not present

## 2022-11-13 LAB — CULTURE, BLOOD (ROUTINE X 2)
Culture: NO GROWTH
Culture: NO GROWTH
Special Requests: ADEQUATE
Special Requests: ADEQUATE

## 2022-11-13 MED ORDER — CLONIDINE HCL 0.1 MG PO TABS
0.1000 mg | ORAL_TABLET | ORAL | Status: AC
  Administered 2022-11-13: 0.1 mg via ORAL
  Filled 2022-11-13: qty 1

## 2022-11-13 NOTE — BH IP Treatment Plan (Signed)
Interdisciplinary Treatment and Diagnostic Plan Update  11/13/2022 Time of Session: 8:30am, update Jerrin Cohan MRN: [id number]  Principal Diagnosis: Schizoaffective disorder, bipolar type ([place])  Secondary Diagnoses: Principal Problem:   Schizoaffective disorder, bipolar type ([place])   Current Medications:  Current Facility-Administered Medications  Medication Dose Route Frequency Provider Last Rate Last Admin   acetaminophen (TYLENOL) tablet 650 mg  650 mg Oral Q6H PRN Rosezetta Schlatter, MD       alum & mag hydroxide-simeth (MAALOX/MYLANTA) 200-200-20 MG/5ML suspension 30 mL  30 mL Oral Q4H PRN Rosezetta Schlatter, MD       benztropine (COGENTIN) tablet 1 mg  1 mg Oral BID Lindell Spar I, NP   1 mg at 11/13/22 0751   diphenhydrAMINE (BENADRYL) capsule 50 mg  50 mg Oral TID PRN Rosezetta Schlatter, MD   50 mg at 11/11/22 2238   Or   diphenhydrAMINE (BENADRYL) injection 50 mg  50 mg Intramuscular TID PRN Rosezetta Schlatter, MD       haloperidol (HALDOL) tablet 10 mg  10 mg Oral BID Lindell Spar I, NP   10 mg at 11/13/22 [id number]   haloperidol (HALDOL) tablet 5 mg  5 mg Oral TID PRN Rosezetta Schlatter, MD   5 mg at 11/10/22 1945   Or   haloperidol lactate (HALDOL) injection 5 mg  5 mg Intramuscular TID PRN Rosezetta Schlatter, MD       hydrOXYzine (ATARAX) tablet 25 mg  25 mg Oral TID PRN Rosezetta Schlatter, MD   25 mg at 11/11/22 2217   lisinopril (ZESTRIL) tablet 10 mg  10 mg Oral Daily Lindell Spar I, NP   10 mg at 11/13/22 [id number]   LORazepam (ATIVAN) tablet 2 mg  2 mg Oral TID PRN Rosezetta Schlatter, MD   2 mg at 11/11/22 2238   Or   LORazepam (ATIVAN) injection 2 mg  2 mg Intramuscular TID PRN Rosezetta Schlatter, MD       magnesium hydroxide (MILK OF MAGNESIA) suspension 30 mL  30 mL Oral Daily PRN Rosezetta Schlatter, MD       nicotine (NICODERM CQ - dosed in mg/24 hours) patch 14 mg  14 mg Transdermal Daily Massengill, Ovid Curd, MD   14 mg at 11/13/22 0753   traZODone (DESYREL) tablet 50 mg  50 mg Oral QHS PRN Rosezetta Schlatter, MD   50 mg at 11/12/22 2153   PTA Medications: Medications Prior to Admission  Medication Sig Dispense Refill Last Dose   acetaminophen (TYLENOL) 325 MG tablet Take 2 tablets (650 mg total) by mouth every 6 (six) hours as needed for mild pain.      alum & mag hydroxide-simeth (MAALOX/MYLANTA) 200-200-20 MG/5ML suspension Take 30 mLs by mouth every 4 (four) hours as needed for indigestion. 355 mL 0    benztropine (COGENTIN) 0.5 MG tablet Take 1 tablet (0.5 mg total) by mouth 2 (two) times daily. 60 tablet 2    divalproex (DEPAKOTE ER) 500 MG 24 hr tablet Take 1 tablet (500 mg total) by mouth at bedtime. 30 tablet 2    haloperidol (HALDOL) 10 MG tablet Take 1 tablet (10 mg total) by mouth once nightly at bedtime. 30 tablet 2    magnesium hydroxide (MILK OF MAGNESIA) 400 MG/5ML suspension Take 30 mLs by mouth daily as needed for mild constipation. 355 mL 0    OVER THE COUNTER MEDICATION Take 1 tablet by mouth daily. Vitafusion Multivitamin Gummy Vitamins       Patient Stressors: Traumatic event    Patient  Strengths: Physical Health   Treatment Modalities: Medication Management, Group therapy, Case management,  1 to 1 session with clinician, Psychoeducation, Recreational therapy.   Physician Treatment Plan for Primary Diagnosis: Schizoaffective disorder, bipolar type ([place]) Long Term Goal(s): Improvement in symptoms so as ready for discharge   Short Term Goals: Ability to identify and develop effective coping behaviors will improve Ability to maintain clinical measurements within normal limits will improve Compliance with prescribed medications will improve Ability to identify triggers associated with substance abuse/mental health issues will improve Ability to identify changes in lifestyle to reduce recurrence of condition will improve Ability to verbalize feelings will improve Ability to disclose and discuss suicidal ideas Ability to demonstrate self-control will  improve  Medication Management: Evaluate patient's response, side effects, and tolerance of medication regimen.  Therapeutic Interventions: 1 to 1 sessions, Unit Group sessions and Medication administration.  Evaluation of Outcomes: Progressing  Physician Treatment Plan for Secondary Diagnosis: Principal Problem:   Schizoaffective disorder, bipolar type ([place])  Long Term Goal(s): Improvement in symptoms so as ready for discharge   Short Term Goals: Ability to identify and develop effective coping behaviors will improve Ability to maintain clinical measurements within normal limits will improve Compliance with prescribed medications will improve Ability to identify triggers associated with substance abuse/mental health issues will improve Ability to identify changes in lifestyle to reduce recurrence of condition will improve Ability to verbalize feelings will improve Ability to disclose and discuss suicidal ideas Ability to demonstrate self-control will improve     Medication Management: Evaluate patient's response, side effects, and tolerance of medication regimen.  Therapeutic Interventions: 1 to 1 sessions, Unit Group sessions and Medication administration.  Evaluation of Outcomes: Progressing   RN Treatment Plan for Primary Diagnosis: Schizoaffective disorder, bipolar type ([place]) Long Term Goal(s): Knowledge of disease and therapeutic regimen to maintain health will improve  Short Term Goals: Ability to remain free from injury will improve, Ability to verbalize frustration and anger appropriately will improve, Ability to demonstrate self-control, Ability to participate in decision making will improve, Ability to verbalize feelings will improve, Ability to disclose and discuss suicidal ideas, Ability to identify and develop effective coping behaviors will improve, and Compliance with prescribed medications will improve  Medication Management: RN will administer medications as  ordered by provider, will assess and evaluate patient's response and provide education to patient for prescribed medication. RN will report any adverse and/or side effects to prescribing provider.  Therapeutic Interventions: 1 on 1 counseling sessions, Psychoeducation, Medication administration, Evaluate responses to treatment, Monitor vital signs and CBGs as ordered, Perform/monitor CIWA, COWS, AIMS and Fall Risk screenings as ordered, Perform wound care treatments as ordered.  Evaluation of Outcomes: Progressing   LCSW Treatment Plan for Primary Diagnosis: Schizoaffective disorder, bipolar type ([place]) Long Term Goal(s): Safe transition to appropriate next level of care at discharge, Engage patient in therapeutic group addressing interpersonal concerns.  Short Term Goals: Engage patient in aftercare planning with referrals and resources, Increase social support, Increase ability to appropriately verbalize feelings, Increase emotional regulation, Facilitate acceptance of mental health diagnosis and concerns, Facilitate patient progression through stages of change regarding substance use diagnoses and concerns, Identify triggers associated with mental health/substance abuse issues, and Increase skills for wellness and recovery  Therapeutic Interventions: Assess for all discharge needs, 1 to 1 time with Social worker, Explore available resources and support systems, Assess for adequacy in community support network, Educate family and significant other(s) on suicide prevention, Complete Psychosocial Assessment, Interpersonal group therapy.  Evaluation  of Outcomes: Progressing   Progress in Treatment: Attending groups: Yes. Participating in groups: Yes. Taking medication as prescribed: Yes. Toleration medication: Yes. Family/Significant other contact made: Yes, individual(s) contacted:  [name] (Mother) [phone] Patient understands diagnosis: No. Discussing patient identified  problems/goals with staff: Yes. Medical problems stabilized or resolved: Yes. Denies suicidal/homicidal ideation: Yes. Issues/concerns per patient self-inventory: No.   New problem(s) identified: No, Describe:  none reported   New Short Term/Long Term Goal(s):  medication stabilization, elimination of SI thoughts, development of comprehensive mental wellness plan.    Patient Goals:  Pt will continue to work on initial tx team goals.  "Work on my music career and my religion"  Discharge Plan or Barriers:  Pt has a referral for ACTT.  Patient will follow up with [facility] for med management and therapy while waiting to get connected with ACTT.    Reason for Continuation of Hospitalization: Anxiety Delusions  Depression Mania Medication stabilization  Estimated Length of Stay:  1-3 days  Last [place] Suicide Severity Risk Score: [place] Admission (Current) from 11/07/2022 in [facility] Most recent reading at 11/07/2022  6:00 PM ED from 11/07/2022 in [facility] Most recent reading at 11/07/2022  3:14 AM Video Visit from 04/07/2022 in [facility] Most recent reading at 04/07/2022  3:44 PM  C-SSRS RISK CATEGORY No Risk No Risk No Risk       Last PHQ 2/9 Scores:    04/07/2022    3:44 PM 12/02/2021    3:31 PM 10/07/2021    2:34 PM  Depression screen PHQ 2/9  Decreased Interest 1 1 1   Down, Depressed, Hopeless 1 1 0  PHQ - 2 Score 2 2 1   Altered sleeping 2 2   Tired, decreased energy 1 2   Change in appetite 2 2   Feeling bad or failure about yourself  2 2   Trouble concentrating 1 1   Moving slowly or fidgety/restless 1 1   Suicidal thoughts 1 1   PHQ-9 Score 12 13   Difficult doing work/chores Somewhat difficult Somewhat difficult     Scribe for Treatment Team: Zachery Conch, LCSW 11/13/2022 10:04 AM

## 2022-11-13 NOTE — [facility] Group Notes (Signed)
Pt attended [place] group. Pt was engaged and participated appropriately.

## 2022-11-13 NOTE — Group Note (Signed)
Date:  11/13/2022 Time:  11:04 AM  Group Topic/Focus:  Orientation:   The focus of this group is to educate the patient on the purpose and policies of crisis stabilization and provide a format to answer questions about their admission.  The group details unit policies and expectations of patients while admitted.    Participation Level:  Active  Participation Quality:  Appropriate  Affect:  Appropriate  Cognitive:  Appropriate  Insight: Appropriate  Engagement in Group:  Engaged  Modes of Intervention:  Discussion  Additional Comments:     Jerrye Beavers 11/13/2022, 11:04 AM

## 2022-11-13 NOTE — Plan of Care (Signed)
  Problem: Safety: Goal: Periods of time without injury will increase Outcome: Progressing   

## 2022-11-13 NOTE — Progress Notes (Signed)
   11/12/22 2000  Psych Admission Type (Psych Patients Only)  Admission Status Voluntary  Psychosocial Assessment  Patient Complaints None  Eye Contact Brief  Facial Expression Animated  Affect Anxious  Speech Unremarkable  Interaction Assertive  Motor Activity Fidgety  Appearance/Hygiene Disheveled  Behavior Characteristics Cooperative  Thought Process  Coherency WDL  Content WDL  Delusions None reported or observed  Perception WDL  Hallucination None reported or observed  Judgment Limited  Confusion None  Danger to Self  Current suicidal ideation? Denies   Alert/oriented. Makes needs/concerns known to staff. Pleasant cooperative with staff. Denies SI/HI/A/V hallucinations. Patient states went to group. Will encourage continued compliance and progression towards goals. Verbally contracted for safety. Will continue to monitor.

## 2022-11-13 NOTE — Progress Notes (Signed)
Pt denied SI/HI/AVH this morning. Pt rated her depression a 4/10, anxiety a 4/10, and feelings of hopelessness a 4/10. Pt has been cooperative throughout the shift. Pt given scheduled medications as prescribed. Q15 min checks verified for safety.  Patient verbally contracts for safety. Patient compliant with medications and treatment plan. Patient is interacting well on the unit. Pt is safe on the unit.   11/13/22 0900  Psych Admission Type (Psych Patients Only)  Admission Status Voluntary  Psychosocial Assessment  Patient Complaints None  Eye Contact Brief  Facial Expression Animated  Affect Anxious  Speech Logical/coherent  Interaction Assertive  Motor Activity Fidgety  Appearance/Hygiene Disheveled  Behavior Characteristics Cooperative;Anxious  Mood Anxious  Thought Process  Coherency WDL  Content WDL  Delusions None reported or observed  Perception WDL  Hallucination None reported or observed  Judgment Impaired  Confusion None  Danger to Self  Current suicidal ideation? Denies  Danger to Others  Danger to Others None reported or observed

## 2022-11-13 NOTE — Progress Notes (Signed)
[facility] MD Progress Note  11/13/2022 4:12 PM Steven Clayton  MRN:  [id number]  Reason for admission: 42 year old AA male with hx of metal illness & probable hx of substance use disorders. He is known in this [facility] with complain of worsening symptoms of Schizoaffective disorder, bipolar type. Patient was a patient in this [facility] last in July of 2022. He is being admitted to [facility] this time around with complain of increased paranoia/delusional thinking. Patient apparently had called the cops seeking a protective custody as he felt there were some people after him. After the cops picked him-up, he was taken to the [facility] for evaluation & was later transferred to the [facility] for further psychiatric evaluation/treatments.   Daily notes: Misty is seen and examined in the office sitting up in a chair.  He presents alert, oriented to self/situation & visible on the unit, attending group sessions.  Patient is cooperative during this evaluation.  His speech is clear, rapid and occasionally illogical during the examination.  He reports, "my brother and the girlfriend took my psychotropic medications to the  [facility].  That the doctors at the clinic use my medications the feed the whales.  My brother was later caught and was put in jail with the girlfriend."   There are no verbal aggressiveness noted. There are no agitation present. He says he is doing well on his medications.  Nursing staff reports in the last two evenings that patient received the agitation protocols due to verbal aggression & hallucinations. He appears pleasantly disorganized, calm & relatable during the examination. He is attending group sessions. He currently denies any complaints or medication side effects. He says he slept well last night up to 8 hours. Patient continues on Haldol 10 mg po bid for mood control, and Cogentin 1 mg p.o. twice daily without signs of EPS. The plan is to transition him to the injectable form of Haldol on a monthly basis by  discharge.  He continues on lisinopril to 10 mg po daily for elevated blood pressure. His vital signs reviewed, b/p: 121/82, Pulse 88. Then will continue current plan of care as already in progress below.  Previously: This provider had a brief phone conversation with patient's mother [name]). She pleaded that Marckus should be given an injectable form of any medications he is getting here upon discharge as he is known to be non-compliance to his treatment regimen. She also requested for an Act team referral to help him manage his mental health after discharge. Patient's mother is informed that the social worker is aware & working on the Act Team referral.  Collateral information: This is obtained over the phone. This provider spoke with patient's mother [name] at [phone] concerning visiting with patient to let staff know if patient thought process and organization is at baseline for patient. [name] is in agreement, however, informed this provider that she could only come on Thursday evening to check on patient.  Principal Problem: Schizoaffective disorder, bipolar type ([place])  Diagnosis: Principal Problem:   Schizoaffective disorder, bipolar type ([place])  Total Time spent with patient:  35 minutes  Past Psychiatric History: See H&P.  Past Medical History:  Past Medical History:  Diagnosis Date   Anxiety    Depression    Schizoaffective disorder ([place])    Stroke [facility])    History reviewed. No pertinent surgical history.  Family History:  Family History  Problem Relation Age of Onset   Heart disease Mother    Hyperlipidemia Mother  Mental illness Father    Family Psychiatric  History: See H&P.  Social History:  Social History   Substance and Sexual Activity  Alcohol Use Yes   Alcohol/week: 1.0 standard drink of alcohol   Types: 1 Glasses of wine per week     Social History   Substance and Sexual Activity  Drug Use No    Social History   Socioeconomic  History   Marital status: Single    Spouse name: Not on file   Number of children: Not on file   Years of education: Not on file   Highest education level: Not on file  Occupational History   Not on file  Tobacco Use   Smoking status: Some Days    Packs/day: 2    Types: Cigarettes   Smokeless tobacco: Never  Vaping Use   Vaping Use: Never used  Substance and Sexual Activity   Alcohol use: Yes    Alcohol/week: 1.0 standard drink of alcohol    Types: 1 Glasses of wine per week   Drug use: No   Sexual activity: Never  Other Topics Concern   Not on file  Social History Narrative   Not on file   Social Determinants of Health   Financial Resource Strain: Not on file  Food Insecurity: No Food Insecurity (11/07/2022)   Hunger Vital Sign    Worried About Running Out of Food in the Last Year: Never true    Ran Out of Food in the Last Year: Never true  Transportation Needs: No Transportation Needs (11/07/2022)   PRAPARE - Hydrologist (Medical): No    Lack of Transportation (Non-Medical): No  Physical Activity: Not on file  Stress: Not on file  Social Connections: Not on file   Additional Social History:   Sleep: Good  Appetite:  Good  Current Medications: Current Facility-Administered Medications  Medication Dose Route Frequency Provider Last Rate Last Admin   acetaminophen (TYLENOL) tablet 650 mg  650 mg Oral Q6H PRN Rosezetta Schlatter, MD       alum & mag hydroxide-simeth (MAALOX/MYLANTA) 200-200-20 MG/5ML suspension 30 mL  30 mL Oral Q4H PRN Rosezetta Schlatter, MD       benztropine (COGENTIN) tablet 1 mg  1 mg Oral BID Nwoko, Herbert Pun I, NP   1 mg at 11/13/22 0751   diphenhydrAMINE (BENADRYL) capsule 50 mg  50 mg Oral TID PRN Rosezetta Schlatter, MD   50 mg at 11/11/22 2238   Or   diphenhydrAMINE (BENADRYL) injection 50 mg  50 mg Intramuscular TID PRN Rosezetta Schlatter, MD       haloperidol (HALDOL) tablet 10 mg  10 mg Oral BID Lindell Spar I, NP   10 mg at  11/13/22 [id number]   haloperidol (HALDOL) tablet 5 mg  5 mg Oral TID PRN Rosezetta Schlatter, MD   5 mg at 11/10/22 1945   Or   haloperidol lactate (HALDOL) injection 5 mg  5 mg Intramuscular TID PRN Rosezetta Schlatter, MD       hydrOXYzine (ATARAX) tablet 25 mg  25 mg Oral TID PRN Rosezetta Schlatter, MD   25 mg at 11/11/22 2217   lisinopril (ZESTRIL) tablet 10 mg  10 mg Oral Daily Lindell Spar I, NP   10 mg at 11/13/22 0752   LORazepam (ATIVAN) tablet 2 mg  2 mg Oral TID PRN Rosezetta Schlatter, MD   2 mg at 11/11/22 2238   Or   LORazepam (ATIVAN) injection 2 mg  2  mg Intramuscular TID PRN Rosezetta Schlatter, MD       magnesium hydroxide (MILK OF MAGNESIA) suspension 30 mL  30 mL Oral Daily PRN Rosezetta Schlatter, MD       nicotine (NICODERM CQ - dosed in mg/24 hours) patch 14 mg  14 mg Transdermal Daily Massengill, Nathan, MD   14 mg at 11/13/22 0753   traZODone (DESYREL) tablet 50 mg  50 mg Oral QHS PRN Rosezetta Schlatter, MD   50 mg at 11/12/22 2153   Lab Results:  No results found for this or any previous visit (from the past 78 hour(s)).  Blood Alcohol level:  Lab Results  Component Value Date   ETH <10 03/14/2021   ETH <5 [id number]   Metabolic Disorder Labs: Lab Results  Component Value Date   HGBA1C 5.5 11/07/2022   MPG 111 11/07/2022   MPG 111 03/17/2021   Lab Results  Component Value Date   PROLACTIN 33.3 (H) 03/17/2021   PROLACTIN 26.5 (H) 01/20/2015   Lab Results  Component Value Date   CHOL 210 (H) 11/07/2022   TRIG 144 11/07/2022   HDL 51 11/07/2022   CHOLHDL 4.1 11/07/2022   VLDL 29 11/07/2022   LDLCALC 130 (H) 11/07/2022   LDLCALC 102 (H) 03/17/2021   Physical Findings: AIMS:  , ,  ,  ,    CIWA:    COWS:     Musculoskeletal: Strength & Muscle Tone: within normal limits Gait & Station: normal Patient leans: N/A  Psychiatric Specialty Exam:  Presentation  General Appearance:  Casual  Eye Contact: Good  Speech: Clear and Coherent; Normal Rate  Speech  Volume: Increased  Handedness: Right  Mood and Affect  Mood: -- (Improving)  Affect: Congruent  Thought Process  Thought Processes: Disorganized  Descriptions of Associations:Intact  Orientation:Full (Time, Place and Person)  Thought Content:Illogical  History of Schizophrenia/Schizoaffective disorder:Yes  Duration of Psychotic Symptoms:Greater than six months  Hallucinations:Hallucinations: None Description of Visual Hallucinations: n/a  Ideas of Reference:None  Suicidal Thoughts:Suicidal Thoughts: No  Homicidal Thoughts:Homicidal Thoughts: No  Sensorium  Memory: Immediate Fair; Recent Fair  Judgment: Fair  Insight: Fair  Community education officer  Concentration: Fair  Attention Span: Fair  Recall: [employer] of Knowledge: Fair  Language: Fair  Psychomotor Activity  Psychomotor Activity: Psychomotor Activity: Normal  Assets  Assets: Communication Skills; Desire for Improvement; Physical Health; Resilience; Social Support  Sleep  Sleep: Sleep: Good Number of Hours of Sleep: 8  Physical Exam: Physical Exam Vitals and nursing note reviewed.  HENT:     Head: Normocephalic.     Nose: Nose normal.     Mouth/Throat:     Pharynx: Oropharynx is clear.  Eyes:     Pupils: Pupils are equal, round, and reactive to light.  Cardiovascular:     Rate and Rhythm: Normal rate.     Pulses: Normal pulses.  Pulmonary:     Effort: Pulmonary effort is normal.  Genitourinary:    Comments: Deferred Musculoskeletal:        General: Normal range of motion.     Cervical back: Normal range of motion.  Skin:    General: Skin is warm and dry.  Neurological:     General: No focal deficit present.     Mental Status: He is alert and oriented to person, place, and time.  Psychiatric:        Behavior: Behavior normal.    Review of Systems  Constitutional:  Negative for chills, diaphoresis, fever and malaise/fatigue.  HENT:  Negative for congestion and  sore throat.   Eyes:  Negative for blurred vision.  Respiratory:  Negative for cough, shortness of breath and wheezing.   Cardiovascular:  Negative for chest pain and palpitations.  Gastrointestinal:  Negative for abdominal pain, diarrhea, heartburn, nausea and vomiting.  Genitourinary:  Negative for dysuria.  Musculoskeletal:  Negative for joint pain and myalgias.  Skin:  Negative for itching and rash.  Neurological:  Negative for dizziness, tingling, tremors, sensory change, speech change, focal weakness, seizures, loss of consciousness, weakness and headaches.  Psychiatric/Behavioral:  Positive for hallucinations. Negative for depression, memory loss, substance abuse and suicidal ideas. The patient is nervous/anxious. The patient does not have insomnia.    Blood pressure 121/82, pulse 88, temperature 98.4 F (36.9 C), temperature source Oral, resp. rate 18, height 5\' 9"  (1.753 m), weight 98.9 kg, SpO2 100 %. Body mass index is 32.19 kg/m.  Treatment Plan Summary: Daily contact with patient to assess and evaluate symptoms and progress in treatment and Medication management.   Continue inpatient hospitalization.  Will continue today 11/13/2022 plan as below except where it is noted.   Principal/active diagnoses.  Schizoaffective disorder, bipolar type ([place]).    Associated symptoms.  Mania.  Agitation.  Grandiosity.  Restlessness.  Delusional thoughts.   Other medical issues.  Hypertension.  Plan:  -Continue Haldol 10 mg po bid for mood control.  -Continue Benztropine 1 mg po bid for eps prevention.  -Continue hydroxyzine 25 mg po tid prn for anxiety. -Continue Trazodone 50 mg po Q hs prn for insomnia.    Agitation protocols: Cont as recommended;  -Benadryl 50 mg po or IM tid prn. -Haldol 5 mg po or IM tid prn.  -Lorazepam 2 mg po or IM tid prn.   Other medical issues.  -Continue Lisinopril from 10 mg po daily for HTN.  -administered Clonidine 0.2 mg po x once for  elevated blood pressure - 178/120.   Other PRNS -Continue Tylenol 650 mg every 6 hours PRN for mild pain -Continue Maalox 30 ml Q 4 hrs PRN for indigestion -Continue MOM 30 ml po Q 6 hrs for constipation   Safety and Monitoring: Voluntary admission to inpatient psychiatric unit for safety, stabilization and treatment Daily contact with patient to assess and evaluate symptoms and progress in treatment Patient's case to be discussed in multi-disciplinary team meeting Observation Level : q15 minute checks Vital signs: q12 hours Precautions: Safety   Discharge Planning: Social work and case management to assist with discharge planning and identification of hospital follow-up needs prior to discharge Estimated LOS: 5-7 days Discharge Concerns: Need to establish a safety plan; Medication compliance and effectiveness Discharge Goals: Return home with outpatient referrals for mental health follow-up including medication management/psychotherapy  Laretta Bolster, FNP 11/13/2022, 4:12 PM Patient ID: [name], male   DOB: 08-14-1981, 42 y.o.   MRN: [id number] Patient ID: Eean Mccarrick, male   DOB: 08/27/1980, 42 y.o.   MRN: [id number] Patient ID: Faizan Edholm, male   DOB: [birth year], 42 y.o.   MRN: [id number] Patient ID: Ivica Risenhoover, male   DOB: [birth year], 42 y.o.   MRN: [id number]

## 2022-11-13 NOTE — Group Note (Signed)
Occupational Therapy Group Note   Group Topic:Goal Setting  Group Date: 11/13/2022 Start Time: 1430 End Time: 1500 Facilitators: Brantley Stage, OT   Group Description: Group encouraged engagement and participation through discussion focused on goal setting. Group members were introduced to goal-setting using the SMART Goal framework, identifying goals as Specific, Measureable, Acheivable, Relevant, and Time-Bound. Group members took time from group to create their own personal goal reflecting the SMART goal template and shared for review by peers and OT.    Therapeutic Goal(s):  Identify at least one goal that fits the SMART framework    Participation Level: Active   Participation Quality: Moderate Cues   Behavior: Bizarre, Distracted, and Disruptive   Speech/Thought Process: Disorganized, Distracted, Loose association , Tangential , and Unfocused   Affect/Mood: Full range   Insight: Lacking   Judgement: Lacking   Individualization: pt was disruptive and disorganized in their participation of group discussion/activity. No noted new skills were identified  Modes of Intervention: Education  Patient Response to Interventions:  Disorganized / disruptive   Plan: Continue to engage patient in OT groups 2 - 3x/week.  11/13/2022  Brantley Stage, OT Cornell Barman, OT

## 2022-11-14 DIAGNOSIS — F250 Schizoaffective disorder, bipolar type: Secondary | ICD-10-CM | POA: Diagnosis not present

## 2022-11-14 MED ORDER — DIVALPROEX SODIUM ER 500 MG PO TB24
500.0000 mg | ORAL_TABLET | Freq: Every day | ORAL | Status: DC
  Administered 2022-11-14 – 2022-11-20 (×7): 500 mg via ORAL
  Filled 2022-11-14 (×8): qty 1
  Filled 2022-11-14: qty 7
  Filled 2022-11-14: qty 1

## 2022-11-14 MED ORDER — CLONIDINE HCL 0.1 MG PO TABS
0.1000 mg | ORAL_TABLET | Freq: Every day | ORAL | Status: DC
  Administered 2022-11-15 – 2022-11-20 (×6): 0.1 mg via ORAL
  Filled 2022-11-14: qty 1
  Filled 2022-11-14: qty 7
  Filled 2022-11-14 (×6): qty 1

## 2022-11-14 MED ORDER — CLONIDINE HCL 0.1 MG PO TABS: 0.1000 mg | ORAL_TABLET | Freq: Two times a day (BID) | ORAL | Status: DC

## 2022-11-14 MED ORDER — CLONIDINE HCL 0.1 MG PO TABS
0.1000 mg | ORAL_TABLET | Freq: Two times a day (BID) | ORAL | Status: DC | PRN
  Administered 2022-11-14: 0.1 mg via ORAL
  Filled 2022-11-14: qty 1
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
## 2022-11-14 NOTE — Group Note (Signed)
Date:  11/14/2022 Time:  2:00 PM  Group Topic/Focus:  Peer Support    Participation Level:  Active  Participation Quality:  Appropriate  Affect:  Appropriate  Cognitive:  Appropriate  Insight: Appropriate  Engagement in Group:  Engaged  Modes of Intervention:  Education and Support  Additional Comments:   Pt attended and participated in the [facility] group.  Wetzel Bjornstad Minard Millirons 11/14/2022, 2:00 PM

## 2022-11-14 NOTE — Progress Notes (Signed)
   11/14/22 0800  Psych Admission Type (Psych Patients Only)  Admission Status Voluntary  Psychosocial Assessment  Patient Complaints None  Eye Contact Brief  Facial Expression Animated  Affect Anxious  Speech Logical/coherent  Interaction Assertive  Motor Activity Fidgety  Appearance/Hygiene Improved  Behavior Characteristics Cooperative;Anxious  Mood Anxious  Thought Process  Coherency WDL  Content WDL  Delusions WDL  Perception WDL  Hallucination None reported or observed  Judgment Impaired  Confusion None  Danger to Self  Current suicidal ideation? Denies  Danger to Others  Danger to Others None reported or observed

## 2022-11-14 NOTE — [facility] Group Notes (Signed)
Spiritual care group on grief and loss facilitated by Chaplain Janne Napoleon, Bcc and Lysle Morales, counseling intern.  Group Goal: Support / Education around grief and loss  Members engage in facilitated group support and psycho-social education.  Group Description:  Following introductions and group rules, group members engaged in facilitated group dialogue and support around topic of loss, with particular support around experiences of loss in their lives. Group Identified types of loss (relationships / self / things) and identified patterns, circumstances, and changes that precipitate losses. Reflected on thoughts / feelings around loss, normalized grief responses, and recognized variety in grief experience. Group encouraged individual reflection on safe space and on the coping skills that they are already utilizing.  Group drew on Adlerian / Rogerian and narrative framework  Patient Progress: Steven Clayton attended group and participated in conversation.  His comments were difficult to follow.  At the beginning of group, his comments were on topic and appropriate to group context, but became increasingly harder to follow, not appropriate to group context and less open to redirection.  Chaplain asked a staff member to call him out of group as it was creating a difficult environment for other patients.    [address] [place] Pager, [phone]

## 2022-11-14 NOTE — Group Note (Signed)
Date:  11/14/2022 Time:  10:30 AM  Group Topic/Focus:  Goals Group:   The focus of this group is to help patients establish daily goals to achieve during treatment and discuss how the patient can incorporate goal setting into their daily lives to aide in recovery. Orientation:   The focus of this group is to educate the patient on the purpose and policies of crisis stabilization and provide a format to answer questions about their admission.  The group details unit policies and expectations of patients while admitted.    Participation Level:  Active  Participation Quality:  Appropriate  Affect:  Appropriate  Cognitive:  Appropriate  Insight: Appropriate  Engagement in Group:  Engaged  Modes of Intervention:  Discussion and Education  Additional Comments:   Pt attended and actively participated in the Orientation/Goals group. Pt personal goal is to improve medication compliance and to be kind to others.Steven Clayton Steven Clayton 11/14/2022, 10:30 AM

## 2022-11-14 NOTE — Plan of Care (Signed)
  Problem: Education: Goal: Knowledge of [place] [facility] Education information/materials will improve Outcome: Progressing Goal: Emotional status will improve Outcome: Progressing Goal: Mental status will improve Outcome: Progressing Goal: Verbalization of understanding the information provided will improve Outcome: Progressing   Problem: Activity: Goal: Interest or engagement in activities will improve Outcome: Progressing Goal: Sleeping patterns will improve Outcome: Progressing   Problem: Activity: Goal: Interest or engagement in activities will improve Outcome: Progressing Goal: Sleeping patterns will improve Outcome: Progressing

## 2022-11-14 NOTE — Group Note (Signed)
Recreation Therapy Group Note   Group Topic:Animal Assisted Therapy   Group Date: 11/14/2022 Start Time: [id number] End Time: 1030 Facilitators: Ameerah Huffstetler-McCall, Steven Clayton,Steven Clayton Location: [address] Day[address]   Animal-Assisted Activity (AAA) Program Checklist/Progress Notes Patient Eligibility Criteria Checklist & Daily Group note for Rec Tx Intervention  AAA/T Program Assumption of Risk Form signed by Patient/ or Parent Legal Guardian Yes  Patient understands his/her participation is voluntary Yes   Affect/Mood: N/A   Participation Level: Did not attend    Clinical Observations/Individualized Feedback:     Plan: Continue to engage patient in RT group sessions 2-3x/week.   Steven Clayton, Steven Clayton,Steven Clayton 11/14/2022 2:09 PM

## 2022-11-14 NOTE — Progress Notes (Signed)
The patient rated his day as a 7.5 out of 10 . His positive event for the day is that he worked on his sense of humor.

## 2022-11-15 NOTE — Progress Notes (Signed)
   11/15/22 0602  15 Minute Checks  Location Bedroom  Visual Appearance Calm  Behavior Sleeping  Sleep (Behavioral Health Patients Only)  Calculate sleep? (Click Yes once per 24 hr at 0600 safety check) Yes  Documented sleep last 24 hours 7

## 2022-11-15 NOTE — Group Note (Signed)
Recreation Therapy Group Note   Group Topic:Team Building  Group Date: 11/15/2022 Start Time: 0930 End Time: 1000 Facilitators: Aranda Bihm-McCall, LRT,CTRS Location: [address] Dayroom   Goal Area(s) Addresses:  Patient will effectively work with peer towards shared goal.  Patient will identify skills used to make activity successful.  Patient will identify how skills used during activity can be applied to reach post d/c goals.   Group Description: [employer]. In teams of 5-6, patients were given 11 craft pipe cleaners. Using the materials provided, patients were instructed to compete again the opposing team(s) to build the tallest free-standing structure from floor level. The activity was timed; difficulty increased by Probation officer as Pharmacist, hospital continued.  Systematically resources were removed with additional directions for example, placing one arm behind their back, working in silence, and shape stipulations. LRT facilitated post-activity discussion reviewing team processes and necessary communication skills involved in completion. Patients were encouraged to reflect how the skills utilized, or not utilized, in this activity can be incorporated to positively impact support systems post discharge.   Affect/Mood: N/A   Participation Level: Did not attend    Clinical Observations/Individualized Feedback:      Plan: Continue to engage patient in RT group sessions 2-3x/week.   Kayti Poss-McCall, LRT,CTRS 11/15/2022 12:16 PM

## 2022-11-15 NOTE — Group Note (Signed)
Date:  11/15/2022 Time:  5:08 PM  Group Topic/Focus:  Dimensions of Wellness:   The focus of this group is to introduce the topic of wellness and discuss the role each dimension of wellness plays in total health.    Participation Level:  Active  Participation Quality:  Appropriate  Affect:  Angry  Cognitive:  Appropriate  Insight: Appropriate  Engagement in Group:  Engaged  Modes of Intervention:  Exploration  Additional Comments:     Jerrye Beavers 11/15/2022, 5:08 PM

## 2022-11-15 NOTE — [facility] Group Notes (Signed)
Pt attended N/A

## 2022-11-15 NOTE — Progress Notes (Signed)
[facility] MD Progress Note  11/15/2022 5:11 PM Steven Clayton  MRN:  [id number]  Reason for admission: 42 year old AA male with hx of metal illness & probable hx of substance use disorders. He is known in this [facility] with complain of worsening symptoms of Schizoaffective disorder, bipolar type. Patient was a patient in this [facility] last in July of 2022. He is being admitted to [facility] this time around with complain of increased paranoia/delusional thinking. Patient apparently had called the cops seeking a protective custody as he felt there were some people after him. After the cops picked him-up, he was taken to the [facility] for evaluation & was later transferred to the [facility] for further psychiatric evaluation/treatments.   Daily notes: [name] is seen, chart reviewed. The chart findings discussed with the treatment team. Lay presents alert, oriented & aware of situation. He is lying down in bed. He says he is doing well. Says went to the morning group sessions, did not like the group topic, decided to leave to come back to his room. He says he is taking & tolerating his treatment regimen. Denies any side effects. [name] denies any SIHI, AVH, delusional thoughts or paranoia. He does not appear to be responding to any internal stimuli. He denies any symptoms of depression/anxiety. Mother is likely to visit tomorrow. Will then tell us if patient is at his baseline or approaching his baseline. Patient has been referred to the [facility] team services. Will receive haldol injectable prior to discharge. Will continue current plan of care as already in progress. There are no changes made.   Previously: This provider had a brief phone conversation with patient's mother [name]). She pleaded that Abdelkarim should be given an injectable form of any medications he is getting here upon discharge as he is known to be non-compliance to his treatment regimen. She also requested for an [facility] team referral to help him manage his mental health  after discharge. Patient's mother is informed that the social worker is aware & working on the Act Team referral.  Collateral information: This is obtained over the phone. This provider spoke with patient's mother [name] at [phone] concerning visiting with patient to let staff know if patient thought process and organization is at baseline for patient. [name] is in agreement, however, informed this provider that she could only come on Thursday evening 11/16/2022, to check on patient.  Principal Problem: Schizoaffective disorder, bipolar type ([place])  Diagnosis: Principal Problem:   Schizoaffective disorder, bipolar type ([place])  Total Time spent with patient:  35 minutes  Past Psychiatric History: See H&P.  Past Medical History:  Past Medical History:  Diagnosis Date   Anxiety    Depression    Schizoaffective disorder ([place])    Stroke [facility])    History reviewed. No pertinent surgical history.  Family History:  Family History  Problem Relation Age of Onset   Heart disease Mother    Hyperlipidemia Mother    Mental illness Father    Family Psychiatric  History: See H&P.  Social History:  Social History   Substance and Sexual Activity  Alcohol Use Yes   Alcohol/week: 1.0 standard drink of alcohol   Types: 1 Glasses of wine per week     Social History   Substance and Sexual Activity  Drug Use No    Social History   Socioeconomic History   Marital status: Single    Spouse name: Not on file   Number of children: Not on file  Years of education: Not on file   Highest education level: Not on file  Occupational History   Not on file  Tobacco Use   Smoking status: Some Days    Packs/day: 2    Types: Cigarettes   Smokeless tobacco: Never  Vaping Use   Vaping Use: Never used  Substance and Sexual Activity   Alcohol use: Yes    Alcohol/week: 1.0 standard drink of alcohol    Types: 1 Glasses of wine per week   Drug use: No   Sexual activity: Never  Other  Topics Concern   Not on file  Social History Narrative   Not on file   Social Determinants of Health   Financial Resource Strain: Not on file  Food Insecurity: No Food Insecurity (11/07/2022)   Hunger Vital Sign    Worried About Running Out of Food in the Last Year: Never true    Ran Out of Food in the Last Year: Never true  Transportation Needs: No Transportation Needs (11/07/2022)   PRAPARE - Hydrologist (Medical): No    Lack of Transportation (Non-Medical): No  Physical Activity: Not on file  Stress: Not on file  Social Connections: Not on file   Additional Social History:   Sleep: Good  Appetite:  Good  Current Medications: Current Facility-Administered Medications  Medication Dose Route Frequency Provider Last Rate Last Admin   acetaminophen (TYLENOL) tablet 650 mg  650 mg Oral Q6H PRN Rosezetta Schlatter, MD       alum & mag hydroxide-simeth (MAALOX/MYLANTA) 200-200-20 MG/5ML suspension 30 mL  30 mL Oral Q4H PRN Rosezetta Schlatter, MD       benztropine (COGENTIN) tablet 1 mg  1 mg Oral BID Jana Swartzlander, Herbert Pun I, NP   1 mg at 11/15/22 0801   cloNIDine (CATAPRES) tablet 0.1 mg  0.1 mg Oral Daily Ntuen, Tina C, FNP   0.1 mg at 11/15/22 0801   diphenhydrAMINE (BENADRYL) capsule 50 mg  50 mg Oral TID PRN Rosezetta Schlatter, MD   50 mg at 11/11/22 2238   Or   diphenhydrAMINE (BENADRYL) injection 50 mg  50 mg Intramuscular TID PRN Rosezetta Schlatter, MD       divalproex (DEPAKOTE ER) 24 hr tablet 500 mg  500 mg Oral Daily Ntuen, Tina C, FNP   500 mg at 11/15/22 0802   haloperidol (HALDOL) tablet 10 mg  10 mg Oral BID Lindell Spar I, NP   10 mg at 11/15/22 0802   haloperidol (HALDOL) tablet 5 mg  5 mg Oral TID PRN Rosezetta Schlatter, MD   5 mg at 11/10/22 1945   Or   haloperidol lactate (HALDOL) injection 5 mg  5 mg Intramuscular TID PRN Rosezetta Schlatter, MD       hydrOXYzine (ATARAX) tablet 25 mg  25 mg Oral TID PRN Rosezetta Schlatter, MD   25 mg at 11/11/22 2217   lisinopril  (ZESTRIL) tablet 10 mg  10 mg Oral Daily Lindell Spar I, NP   10 mg at 11/15/22 0803   LORazepam (ATIVAN) tablet 2 mg  2 mg Oral TID PRN Rosezetta Schlatter, MD   2 mg at 11/11/22 2238   Or   LORazepam (ATIVAN) injection 2 mg  2 mg Intramuscular TID PRN Rosezetta Schlatter, MD       magnesium hydroxide (MILK OF MAGNESIA) suspension 30 mL  30 mL Oral Daily PRN Rosezetta Schlatter, MD       nicotine (NICODERM CQ - dosed in mg/24 hours)  patch 14 mg  14 mg Transdermal Daily Massengill, Nathan, MD   14 mg at 11/15/22 0803   traZODone (DESYREL) tablet 50 mg  50 mg Oral QHS PRN Rosezetta Schlatter, MD   50 mg at 11/13/22 2219   Lab Results:  No results found for this or any previous visit (from the past 5 hour(s)).  Blood Alcohol level:  Lab Results  Component Value Date   ETH <10 03/14/2021   ETH <5 [id number]   Metabolic Disorder Labs: Lab Results  Component Value Date   HGBA1C 5.5 11/07/2022   MPG 111 11/07/2022   MPG 111 03/17/2021   Lab Results  Component Value Date   PROLACTIN 33.3 (H) 03/17/2021   PROLACTIN 26.5 (H) 01/20/2015   Lab Results  Component Value Date   CHOL 210 (H) 11/07/2022   TRIG 144 11/07/2022   HDL 51 11/07/2022   CHOLHDL 4.1 11/07/2022   VLDL 29 11/07/2022   LDLCALC 130 (H) 11/07/2022   LDLCALC 102 (H) 03/17/2021   Physical Findings: AIMS:  , ,  ,  ,    CIWA:    COWS:     Musculoskeletal: Strength & Muscle Tone: within normal limits Gait & Station: normal Patient leans: N/A  Psychiatric Specialty Exam:  Presentation  General Appearance:  Casual  Eye Contact: Good  Speech: Clear and Coherent  Speech Volume: Increased  Handedness: Right  Mood and Affect  Mood: Anxious; Depressed  Affect: Congruent  Thought Process  Thought Processes: Disorganized  Descriptions of Associations:Intact  Orientation:Full (Time, Place and Person)  Thought Content:Illogical  History of Schizophrenia/Schizoaffective disorder:Yes  Duration of Psychotic  Symptoms:Greater than six months  Hallucinations:Hallucinations: Auditory Description of Auditory Hallucinations: Hearing negative stuff Description of Visual Hallucinations: Denies  Ideas of Reference:None  Suicidal Thoughts:Suicidal Thoughts: No  Homicidal Thoughts:Homicidal Thoughts: No  Sensorium  Memory: Immediate Fair; Recent Fair  Judgment: Fair  Insight: Fair  Materials engineer: Fair  Attention Span: Fair  Recall: [employer] of Knowledge: Fair  Language: Fair  Psychomotor Activity  Psychomotor Activity: Psychomotor Activity: Normal  Assets  Assets: Communication Skills; Desire for Improvement; Physical Health; Social Support  Sleep  Sleep: Sleep: Good Number of Hours of Sleep: 10  Physical Exam: Physical Exam Vitals and nursing note reviewed.  HENT:     Head: Normocephalic.     Nose: Nose normal.     Mouth/Throat:     Pharynx: Oropharynx is clear.  Eyes:     Pupils: Pupils are equal, round, and reactive to light.  Cardiovascular:     Rate and Rhythm: Normal rate.     Pulses: Normal pulses.  Pulmonary:     Effort: Pulmonary effort is normal.  Genitourinary:    Comments: Deferred Musculoskeletal:        General: Normal range of motion.     Cervical back: Normal range of motion.  Skin:    General: Skin is warm and dry.  Neurological:     General: No focal deficit present.     Mental Status: He is alert and oriented to person, place, and time.  Psychiatric:        Behavior: Behavior normal.    Review of Systems  Constitutional:  Negative for chills, diaphoresis, fever and malaise/fatigue.  HENT:  Negative for congestion and sore throat.   Eyes:  Negative for blurred vision.  Respiratory:  Negative for cough, shortness of breath and wheezing.   Cardiovascular:  Negative for chest pain and palpitations.  Gastrointestinal:  Negative for abdominal pain, diarrhea, heartburn, nausea and vomiting.  Genitourinary:   Negative for dysuria.  Musculoskeletal:  Negative for joint pain and myalgias.  Skin:  Negative for itching and rash.  Neurological:  Negative for dizziness, tingling, tremors, sensory change, speech change, focal weakness, seizures, loss of consciousness, weakness and headaches.  Psychiatric/Behavioral:  Positive for hallucinations. Negative for depression, memory loss, substance abuse and suicidal ideas. The patient is nervous/anxious. The patient does not have insomnia.    Blood pressure (!) 136/98, pulse 81, temperature 98.2 F (36.8 C), temperature source Oral, resp. rate 18, height 5\' 9"  (1.753 m), weight 98.9 kg, SpO2 99 %. Body mass index is 32.19 kg/m.  Treatment Plan Summary: Daily contact with patient to assess and evaluate symptoms and progress in treatment and Medication management.   Continue inpatient hospitalization.  Will continue today 11/15/2022 plan as below except where it is noted.   Principal/active diagnoses.  Schizoaffective disorder, bipolar type ([place]).    Associated symptoms.  Mania.  Agitation.  Grandiosity.  Restlessness.  Delusional thoughts.   Other medical issues.  Hypertension.  Plan:  -Continue Haldol 10 mg po bid for mood control.  -Continue Benztropine 1 mg po bid for eps prevention.  -Continue hydroxyzine 25 mg po tid prn for anxiety. -Continue Trazodone 50 mg po Q hs prn for insomnia.  -Continue Depakote ER 500 mg p.o. daily for mood stabilization -VPA level on 11/19/2022  Agitation protocols: Cont as recommended;  -Benadryl 50 mg po or IM tid prn. -Haldol 5 mg po or IM tid prn.  -Lorazepam 2 mg po or IM tid prn.   Other medical issues.  -Continue Lisinopril from 10 mg po daily for HTN.  -administered Clonidine 0.2 mg po x once for elevated blood pressure - 178/120.  -Initiate clonidine 0.1 mg p.o. daily for elevated blood pressure   Other PRNS -Continue Tylenol 650 mg every 6 hours PRN for mild pain -Continue Maalox 30 ml Q 4 hrs  PRN for indigestion -Continue MOM 30 ml po Q 6 hrs for constipation   Safety and Monitoring: Voluntary admission to inpatient psychiatric unit for safety, stabilization and treatment Daily contact with patient to assess and evaluate symptoms and progress in treatment Patient's case to be discussed in multi-disciplinary team meeting Observation Level : q15 minute checks Vital signs: q12 hours Precautions: Safety   Discharge Planning: Social work and case management to assist with discharge planning and identification of hospital follow-up needs prior to discharge Estimated LOS: 5-7 days Discharge Concerns: Need to establish a safety plan; Medication compliance and effectiveness Discharge Goals: Return home with outpatient referrals for mental health follow-up including medication management/psychotherapy  Lindell Spar, NP 11/15/2022, 5:11 PM Patient ID: Eliakim Puzon, male   DOB: 18-Nov-1980, 42 y.o.   MRN: [id number] Patient ID: Gearold Pier, male   DOB: [birth year], 42 y.o.   MRN: [id number] Patient ID: Adahir Yarosh, male   DOB: [birth year], 42 y.o.   MRN: [id number] Patient ID: Keng Romanos, male   DOB: 08-25-1980, 42 y.o.   MRN: [id number] Patient ID: Donyel Krahl, male   DOB: 12/23/1980, 42 y.o.   MRN: [id number] Patient ID: Rexall Maybank, male   DOB: [birth year], 42 y.o.   MRN: [id number]

## 2022-11-15 NOTE — Progress Notes (Signed)
D:  Patient denied SI and HI, contracts for safety.  Denied A/V hallucinations.  Denied pain. A:  Medications administered per MD orders.  Emotional support and encouragement given patient. R:  Safety maintained with 15 minute checks.  

## 2022-11-15 NOTE — BHH Group Notes (Signed)
[place] Group Notes:  (Nursing/MHT/Case Management/Adjunct)  Date:  11/15/2022  Time:  9:48 AM  Type of Therapy:  Group Therapy  Participation Level:  Active  Participation Quality:  Appropriate  Affect:  Appropriate  Cognitive:  Appropriate  Insight:  Appropriate  Engagement in Group:  Engaged  Modes of Intervention:  Discussion  Summary of Progress/Problems:  Patient attended and participated in a goals/ orientation group today.   Elza Rafter 11/15/2022, 9:48 AM

## 2022-11-15 NOTE — Plan of Care (Signed)
Nurse discussed anxiety, depression and coping skills with patient.  

## 2022-11-16 NOTE — Group Note (Unsigned)
Date:  11/16/2022 Time:  10:14 AM  Group Topic/Focus:  Orientation:   The focus of this group is to educate the patient on the purpose and policies of crisis stabilization and provide a format to answer questions about their admission.  The group details unit policies and expectations of patients while admitted.     Participation Level:  {BHH PARTICIPATION WO:[id number]  Participation Quality:  {BHH PARTICIPATION QUALITY:22265}  Affect:  {BHH AFFECT:22266}  Cognitive:  {BHH COGNITIVE:22267}  Insight: {BHH Insight2:20797}  Engagement in Group:  {BHH ENGAGEMENT IN BP:[id number]  Modes of Intervention:  {BHH MODES OF INTERVENTION:22269}  Additional Comments:  ***  Jerrye Beavers 11/16/2022, 10:14 AM

## 2022-11-16 NOTE — Progress Notes (Signed)
Pt on unit. Pt medication compliant. Pt endorses continued paranoia about returning home. Pt continues to endorse auditory hallucinations. Pt denies suicidal and homicidal thoughts. Q 15 minute checks ongoing for safety.

## 2022-11-16 NOTE — Progress Notes (Signed)
   11/16/22 2025  Psych Admission Type (Psych Patients Only)  Admission Status Voluntary  Psychosocial Assessment  Patient Complaints None  Eye Contact Fair  Facial Expression Anxious  Affect Anxious  Speech Logical/coherent  Interaction Assertive  Motor Activity Other (Comment) (WNL)  Appearance/Hygiene Poor hygiene  Behavior Characteristics Cooperative  Mood Pleasant  Thought Process  Coherency WDL  Content WDL  Delusions None reported or observed  Perception WDL  Hallucination None reported or observed  Judgment Poor  Confusion None  Danger to Self  Current suicidal ideation? Denies  Agreement Not to Harm Self Yes  Description of Agreement verbal  Danger to Others  Danger to Others None reported or observed   Alert/oriented. Makes needs/concerns known to staff. Pleasant cooperative with staff. Denies SI/HI/A/V hallucinations. Patient states went to group. Will encourage continued compliance and progression towards goals. Verbally contracted for safety. Will continue to monitor.

## 2022-11-16 NOTE — Group Note (Unsigned)
Date:  11/16/2022 Time:  5:17 PM  Group Topic/Focus:  Spirituality:   The focus of this group is to discuss how one's spirituality can aide in recovery.     Participation Level:  {BHH PARTICIPATION HD:[id number]  Participation Quality:  {BHH PARTICIPATION QUALITY:22265}  Affect:  {BHH AFFECT:22266}  Cognitive:  {BHH COGNITIVE:22267}  Insight: {BHH Insight2:20797}  Engagement in Group:  {BHH ENGAGEMENT IN JY:[id number]  Modes of Intervention:  {BHH MODES OF INTERVENTION:22269}  Additional Comments:  ***  Jerrye Beavers 11/16/2022, 5:17 PM

## 2022-11-16 NOTE — BHH Group Notes (Signed)
Pt did not attend wrap-up group   

## 2022-11-16 NOTE — Progress Notes (Signed)
[facility] MD Progress Note  11/16/2022 3:29 PM Steven Clayton  MRN:  [id number]  Reason for admission: 42 year old AA male with hx of metal illness & probable hx of substance use disorders. He is known in this [facility] with complain of worsening symptoms of Schizoaffective disorder, bipolar type. Patient was a patient in this [facility] last in July of 2022. He is being admitted to [facility] this time around with complain of increased paranoia/delusional thinking. Patient apparently had called the cops seeking a protective custody as he felt there were some people after him. After the cops picked him-up, he was taken to the [facility] for evaluation & was later transferred to the [facility] for further psychiatric evaluation/treatments.   Daily notes: Steven Clayton is seen in his room, chart reviewed. The chart findings discussed with the treatment team. Steven Clayton presents alert, oriented & aware of situation. He is lying down in bed. He says he is doing well. He currently denies any new issues or concerns. He reports, "I'm doing good. I'm waiting to be discharged. I'm taking my medicines without being forced. I'm not depressed or feeling depressed". When asked the patient the reason he did not allow his mother meet with him when she visited last evening? He replied, "I don't really need [name] to visit. I only need her when I will need her. I'm doing okay in the hospital, so I don't need her visit here". It appears patient may be approaching his baseline. The social worker states that the Act Team are yet to respond to the request for the referral made for the patient. Steven Clayton remains visible on the unit, attends group sessions. Staff reports that at times may be disruptive during group sessions which may lead to him being escorted out of the group. So far, he remains compliant to his treatment regimen. He currently denies any SIHI, AVH, delusional thoughts or paranoia. He does not appear to be responding to any internal stimuli. Patient has hx of HTN,  clonidine 0.1 mg daily was added to his treatment regimen. His blood pressure remains elevated 136/98. Will recheck.   Previously: This provider had a brief phone conversation with patient's mother [name]). She pleaded that Steven Clayton should be given an injectable form of any medications he is getting here upon discharge as he is known to be non-compliance to his treatment regimen. She also requested for an Act team referral to help him manage his mental health after discharge. Patient's mother is informed that the social worker is aware & working on the Act Team referral.  Collateral information: This is obtained over the phone. This provider spoke with patient's mother [name] at [phone] concerning visiting with patient to let staff know if patient thought process and organization is at baseline for patient. [name] is in agreement, however, informed this provider that she could only come on Thursday evening 11/16/2022, to check on patient.  Principal Problem: Schizoaffective disorder, bipolar type ([place])  Diagnosis: Principal Problem:   Schizoaffective disorder, bipolar type ([place])  Total Time spent with patient:  35 minutes  Past Psychiatric History: See H&P.  Past Medical History:  Past Medical History:  Diagnosis Date   Anxiety    Depression    Schizoaffective disorder ([place])    Stroke [facility])    History reviewed. No pertinent surgical history.  Family History:  Family History  Problem Relation Age of Onset   Heart disease Mother    Hyperlipidemia Mother    Mental illness Father    Family  Psychiatric  History: See H&P.  Social History:  Social History   Substance and Sexual Activity  Alcohol Use Yes   Alcohol/week: 1.0 standard drink of alcohol   Types: 1 Glasses of wine per week     Social History   Substance and Sexual Activity  Drug Use No    Social History   Socioeconomic History   Marital status: Single    Spouse name: Not on file   Number of  children: Not on file   Years of education: Not on file   Highest education level: Not on file  Occupational History   Not on file  Tobacco Use   Smoking status: Some Days    Packs/day: 2    Types: Cigarettes   Smokeless tobacco: Never  Vaping Use   Vaping Use: Never used  Substance and Sexual Activity   Alcohol use: Yes    Alcohol/week: 1.0 standard drink of alcohol    Types: 1 Glasses of wine per week   Drug use: No   Sexual activity: Never  Other Topics Concern   Not on file  Social History Narrative   Not on file   Social Determinants of Health   Financial Resource Strain: Not on file  Food Insecurity: No Food Insecurity (11/07/2022)   Hunger Vital Sign    Worried About Running Out of Food in the Last Year: Never true    Ran Out of Food in the Last Year: Never true  Transportation Needs: No Transportation Needs (11/07/2022)   PRAPARE - Hydrologist (Medical): No    Lack of Transportation (Non-Medical): No  Physical Activity: Not on file  Stress: Not on file  Social Connections: Not on file   Additional Social History:   Sleep: Good  Appetite:  Good  Current Medications: Current Facility-Administered Medications  Medication Dose Route Frequency Provider Last Rate Last Admin   acetaminophen (TYLENOL) tablet 650 mg  650 mg Oral Q6H PRN Rosezetta Schlatter, MD       alum & mag hydroxide-simeth (MAALOX/MYLANTA) 200-200-20 MG/5ML suspension 30 mL  30 mL Oral Q4H PRN Rosezetta Schlatter, MD       benztropine (COGENTIN) tablet 1 mg  1 mg Oral BID Stephano Arrants, Herbert Pun I, NP   1 mg at 11/16/22 0818   cloNIDine (CATAPRES) tablet 0.1 mg  0.1 mg Oral Daily Ntuen, Tina C, FNP   0.1 mg at 11/16/22 0818   diphenhydrAMINE (BENADRYL) capsule 50 mg  50 mg Oral TID PRN Rosezetta Schlatter, MD   50 mg at 11/11/22 2238   Or   diphenhydrAMINE (BENADRYL) injection 50 mg  50 mg Intramuscular TID PRN Rosezetta Schlatter, MD       divalproex (DEPAKOTE ER) 24 hr tablet 500 mg  500 mg  Oral Daily Ntuen, Tina C, FNP   500 mg at 11/16/22 [id number]   haloperidol (HALDOL) tablet 10 mg  10 mg Oral BID Lindell Spar I, NP   10 mg at 11/16/22 0818   haloperidol (HALDOL) tablet 5 mg  5 mg Oral TID PRN Rosezetta Schlatter, MD   5 mg at 11/10/22 1945   Or   haloperidol lactate (HALDOL) injection 5 mg  5 mg Intramuscular TID PRN Rosezetta Schlatter, MD       hydrOXYzine (ATARAX) tablet 25 mg  25 mg Oral TID PRN Rosezetta Schlatter, MD   25 mg at 11/11/22 2217   lisinopril (ZESTRIL) tablet 10 mg  10 mg Oral Daily Encarnacion Slates, NP  10 mg at 11/16/22 0818   LORazepam (ATIVAN) tablet 2 mg  2 mg Oral TID PRN Rosezetta Schlatter, MD   2 mg at 11/11/22 2238   Or   LORazepam (ATIVAN) injection 2 mg  2 mg Intramuscular TID PRN Rosezetta Schlatter, MD       magnesium hydroxide (MILK OF MAGNESIA) suspension 30 mL  30 mL Oral Daily PRN Rosezetta Schlatter, MD       nicotine (NICODERM CQ - dosed in mg/24 hours) patch 14 mg  14 mg Transdermal Daily Massengill, Ovid Curd, MD   14 mg at 11/16/22 0818   traZODone (DESYREL) tablet 50 mg  50 mg Oral QHS PRN Rosezetta Schlatter, MD   50 mg at 11/13/22 2219   Lab Results:  No results found for this or any previous visit (from the past 44 hour(s)).  Blood Alcohol level:  Lab Results  Component Value Date   ETH <10 03/14/2021   ETH <5 [id number]   Metabolic Disorder Labs: Lab Results  Component Value Date   HGBA1C 5.5 11/07/2022   MPG 111 11/07/2022   MPG 111 03/17/2021   Lab Results  Component Value Date   PROLACTIN 33.3 (H) 03/17/2021   PROLACTIN 26.5 (H) 01/20/2015   Lab Results  Component Value Date   CHOL 210 (H) 11/07/2022   TRIG 144 11/07/2022   HDL 51 11/07/2022   CHOLHDL 4.1 11/07/2022   VLDL 29 11/07/2022   LDLCALC 130 (H) 11/07/2022   LDLCALC 102 (H) 03/17/2021   Physical Findings: AIMS:  , ,  ,  ,    CIWA:    COWS:     Musculoskeletal: Strength & Muscle Tone: within normal limits Gait & Station: normal Patient leans: N/A  Psychiatric Specialty  Exam:  Presentation  General Appearance:  Appropriate for Environment; Disheveled  Eye Contact: Fair  Speech: Clear and Coherent; Normal Rate  Speech Volume: Normal  Handedness: Right  Mood and Affect  Mood: -- (Improving)  Affect: Congruent  Thought Process  Thought Processes: Coherent; Goal Directed; Linear  Descriptions of Associations:Intact  Orientation:Full (Time, Place and Person)  Thought Content:Logical  History of Schizophrenia/Schizoaffective disorder:Yes  Duration of Psychotic Symptoms:Greater than six months  Hallucinations:Hallucinations: None Description of Auditory Hallucinations: NA Description of Visual Hallucinations: NA   Ideas of Reference:None  Suicidal Thoughts:Suicidal Thoughts: No   Homicidal Thoughts:Homicidal Thoughts: No   Sensorium  Memory: Immediate Good; Recent Good; Remote Good  Judgment: Fair  Insight: Fair  Community education officer  Concentration: Good  Attention Span: Good  Recall: [place] of Knowledge: Fair  Language: Good  Psychomotor Activity  Psychomotor Activity: Psychomotor Activity: Normal   Assets  Assets: Communication Skills; Desire for Improvement; Financial Resources/Insurance; Housing; Social Support; Resilience; Physical Health  Sleep  Sleep: Sleep: Good Number of Hours of Sleep: 8  Physical Exam: Physical Exam Vitals and nursing note reviewed.  HENT:     Head: Normocephalic.     Nose: Nose normal.     Mouth/Throat:     Pharynx: Oropharynx is clear.  Eyes:     Pupils: Pupils are equal, round, and reactive to light.  Cardiovascular:     Rate and Rhythm: Normal rate.     Pulses: Normal pulses.  Pulmonary:     Effort: Pulmonary effort is normal.  Genitourinary:    Comments: Deferred Musculoskeletal:        General: Normal range of motion.     Cervical back: Normal range of motion.  Skin:    General:  Skin is warm and dry.  Neurological:     General: No focal  deficit present.     Mental Status: He is alert and oriented to person, place, and time.  Psychiatric:        Behavior: Behavior normal.    Review of Systems  Constitutional:  Negative for chills, diaphoresis, fever and malaise/fatigue.  HENT:  Negative for congestion and sore throat.   Eyes:  Negative for blurred vision.  Respiratory:  Negative for cough, shortness of breath and wheezing.   Cardiovascular:  Negative for chest pain and palpitations.  Gastrointestinal:  Negative for abdominal pain, diarrhea, heartburn, nausea and vomiting.  Genitourinary:  Negative for dysuria.  Musculoskeletal:  Negative for joint pain and myalgias.  Skin:  Negative for itching and rash.  Neurological:  Negative for dizziness, tingling, tremors, sensory change, speech change, focal weakness, seizures, loss of consciousness, weakness and headaches.  Psychiatric/Behavioral:  Positive for hallucinations. Negative for depression, memory loss, substance abuse and suicidal ideas. The patient is nervous/anxious. The patient does not have insomnia.    Blood pressure (!) 136/98, pulse 81, temperature 98.2 F (36.8 C), temperature source Oral, resp. rate 18, height 5\' 9"  (1.753 m), weight 98.9 kg, SpO2 99 %. Body mass index is 32.19 kg/m.  Treatment Plan Summary: Daily contact with patient to assess and evaluate symptoms and progress in treatment and Medication management.   Continue inpatient hospitalization.  Will continue today 11/16/2022 plan as below except where it is noted.   Principal/active diagnoses.  Schizoaffective disorder, bipolar type ([place]).    Associated symptoms.  Mania.  Agitation.  Grandiosity.  Restlessness.  Delusional thoughts.   Other medical issues.  Hypertension.  Plan:  -Continue Haldol 10 mg po bid for mood control.  -Continue Benztropine 1 mg po bid for eps prevention.  -Continue hydroxyzine 25 mg po tid prn for anxiety. -Continue Trazodone 50 mg po Q hs prn for  insomnia.  -Continue Depakote ER 500 mg p.o. daily for mood stabilization -VPA level on 11/19/2022  Agitation protocols: Cont as recommended;  -Benadryl 50 mg po or IM tid prn. -Haldol 5 mg po or IM tid prn.  -Lorazepam 2 mg po or IM tid prn.   Other medical issues.  -Continue Lisinopril from 10 mg po daily for HTN.  -administered Clonidine 0.2 mg po x once for elevated blood pressure - 178/120.  -Initiate clonidine 0.1 mg p.o. daily for elevated blood pressure   Other PRNS -Continue Tylenol 650 mg every 6 hours PRN for mild pain -Continue Maalox 30 ml Q 4 hrs PRN for indigestion -Continue MOM 30 ml po Q 6 hrs for constipation   Safety and Monitoring: Voluntary admission to inpatient psychiatric unit for safety, stabilization and treatment Daily contact with patient to assess and evaluate symptoms and progress in treatment Patient's case to be discussed in multi-disciplinary team meeting Observation Level : q15 minute checks Vital signs: q12 hours Precautions: Safety   Discharge Planning: Social work and case management to assist with discharge planning and identification of hospital follow-up needs prior to discharge Estimated LOS: 5-7 days Discharge Concerns: Need to establish a safety plan; Medication compliance and effectiveness Discharge Goals: Return home with outpatient referrals for mental health follow-up including medication management/psychotherapy  Lindell Spar, NP 11/16/2022, 3:29 PM Patient ID: Bladyn Elkind, male   DOB: [birth year], 42 y.o.   MRN: [id number] Patient ID: Tresten Elwood, male   DOB: 12/12/1980, 42 y.o.   MRN: [id number] Patient ID: Khiree Newman, male  DOB: [birth year], 42 y.o.   MRN: [id number] Patient ID: Dominie Hallum, male   DOB: 1980/11/22, 42 y.o.   MRN: [id number] Patient ID: Hoarce Pelfrey, male   DOB: [birth year], 43 y.o.   MRN: [id number] Patient ID: Reedy Doell, male   DOB: 11/30/1980, 42 y.o.   MRN: [id number] Patient ID: Claxton Lyu, male    DOB: 1981-05-22, 42 y.o.   MRN: [id number]

## 2022-11-16 NOTE — Group Note (Signed)
Date:  11/16/2022 Time:  5:26 PM  Group Topic/Focus:  Spirituality:   The focus of this group is to discuss how one's spirituality can aide in recovery.    Participation Level:  Minimal  Participation Quality:  Inattentive  Affect:  Flat  Cognitive:  Lacking  Insight: Lacking  Engagement in Group:  Poor  Modes of Intervention:  Exploration  Additional Comments:     Jerrye Beavers 11/16/2022, 5:26 PM

## 2022-11-16 NOTE — Care Management Important Message (Signed)
Medicare IM printed and given to Silas Flood, LCSW to give to the patient on the unit.

## 2022-11-16 NOTE — Plan of Care (Signed)
  Problem: Safety: Goal: Periods of time without injury will increase Outcome: Progressing   

## 2022-11-16 NOTE — [facility] Group Notes (Signed)
PsychoEducational Group Patients were asked to reflect on how to promote wellbeing with healthy lifestyle choices. Education regarding healthy sleep habits, and lifestyle impact mental health. Conversely patients were then asked to identify warning signs of when they are not well and how to integrate healthy coping skills/lifestyle to help.  Patient attended and was appropriate. Thoughts shared were somewhat disorganized and concrete.

## 2022-11-17 ENCOUNTER — Encounter (HOSPITAL_COMMUNITY): Payer: Self-pay

## 2022-11-17 MED ORDER — HALOPERIDOL DECANOATE 100 MG/ML IM SOLN
100.0000 mg | INTRAMUSCULAR | Status: DC
  Administered 2022-11-18: 100 mg via INTRAMUSCULAR
  Filled 2022-11-17: qty 1

## 2022-11-17 NOTE — Progress Notes (Signed)
Patient appears pleasant. Patient denies SI/HI/VH. Pt endorses hearing voices, but states the medication makes them quieter. Pt reports anxiety is 7/10 and depression is 7/10. Pt reports good sleep and appetite. Patient complied with morning medication with no reported side effects. Patient remains safe on Q50min checks and contracts for safety.      11/17/22 0953  Psych Admission Type (Psych Patients Only)  Admission Status Voluntary  Psychosocial Assessment  Patient Complaints Anxiety;Depression  Eye Contact Fair  Facial Expression Anxious  Affect Anxious  Speech Logical/coherent  Interaction Assertive;Attention-seeking  Motor Activity Fidgety  Appearance/Hygiene Poor hygiene  Behavior Characteristics Cooperative  Mood Pleasant  Thought Process  Coherency Circumstantial  Content WDL  Delusions None reported or observed  Perception WDL  Hallucination None reported or observed  Judgment Poor  Confusion None  Danger to Self  Current suicidal ideation? Denies  Agreement Not to Harm Self Yes  Description of Agreement verbal  Danger to Others  Danger to Others None reported or observed

## 2022-11-17 NOTE — [facility] Group Notes (Signed)
[place] Group Notes:  (Nursing/MHT/Case Management/Adjunct)  Date:  11/17/2022  Time:  8:24 PM  Type of Therapy:   AA  Participation Level:  Active  Participation Quality:  Appropriate  Affect:  Appropriate  Cognitive:  Appropriate  Insight:  Appropriate  Engagement in Group:  Engaged  Modes of Intervention:  Education  Summary of Progress/Problems: Attended AA group.  Orvan Falconer 11/17/2022, 8:24 PM

## 2022-11-17 NOTE — Progress Notes (Signed)
[facility] MD Progress Note  11/17/2022 8:48 AM Steven Clayton  MRN:  [id number]  Reason for admission: 42 year old AA male with hx of metal illness & probable hx of substance use disorders. He is known in this [facility] with complain of worsening symptoms of Schizoaffective disorder, bipolar type. Patient was a patient in this [facility] last in July of 2022. He is being admitted to [facility] this time around with complain of increased paranoia/delusional thinking. Patient apparently had called the cops seeking a protective custody as he felt there were some people after him. After the cops picked him-up, he was taken to the [facility] for evaluation & was later transferred to the [facility] for further psychiatric evaluation/treatments.   Daily notes: Steven Clayton is seen in his room, chart reviewed. The chart findings discussed with the treatment team. Steven Clayton presents alert, oriented & aware of situation. He is lying down in bed. He says he is doing well. He currently denies any new issues or concerns. He reports, "I'm doing good. I'm waiting to be discharged. I'm taking my medicines without being forced. I'm not depressed or feeling depressed".  It appears patient may be approaching his baseline. The social worker states that the Act Team are yet to respond to the request for the referral made for the patient. Steven Clayton remains visible on the unit, attends group sessions. Staff reports that at times may be disruptive during group sessions which may lead to him being escorted out of the group. So far, he remains compliant to his treatment regimen. He currently denies any SIHI, AVH, delusional thoughts or paranoia. He does not appear to be responding to any internal stimuli. Reviewed vital signs, stable. Patient will receive his first dose of haldol injectable tomorrow 05-21-23.   Previously: This provider had a brief phone conversation with patient's mother Steven Clayton). She pleaded that Steven Clayton should be given an injectable form of any  medications he is getting here upon discharge as he is known to be non-compliance to his treatment regimen. She also requested for an Act team referral to help him manage his mental health after discharge. Patient's mother is informed that the social worker is aware & working on the Act Team referral.  Collateral information: This is obtained over the phone. This provider spoke with patient's mother Steven Clayton at [phone] concerning visiting with patient to let staff know if patient thought process and organization is at baseline for patient. Steven Clayton is in agreement, however, informed this provider that she could only come on Thursday evening 11/16/2022, to check on patient.  Principal Problem: Schizoaffective disorder, bipolar type  Diagnosis: Principal Problem:   Schizoaffective disorder, bipolar type ([place])  Total Time spent with patient:  35 minutes  Past Psychiatric History: See H&P.  Past Medical History:  Past Medical History:  Diagnosis Date   Anxiety    Depression    Schizoaffective disorder ([place])    Stroke [facility])    History reviewed. No pertinent surgical history.  Family History:  Family History  Problem Relation Age of Onset   Heart disease Mother    Hyperlipidemia Mother    Mental illness Father    Family Psychiatric  History: See H&P.  Social History:  Social History   Substance and Sexual Activity  Alcohol Use Yes   Alcohol/week: 1.0 standard drink of alcohol   Types: 1 Glasses of wine per week     Social History   Substance and Sexual Activity  Drug Use No    Social History  Socioeconomic History   Marital status: Single    Spouse name: Not on file   Number of children: Not on file   Years of education: Not on file   Highest education level: Not on file  Occupational History   Not on file  Tobacco Use   Smoking status: Some Days    Packs/day: 2    Types: Cigarettes   Smokeless tobacco: Never  Vaping Use   Vaping Use: Never used   Substance and Sexual Activity   Alcohol use: Yes    Alcohol/week: 1.0 standard drink of alcohol    Types: 1 Glasses of wine per week   Drug use: No   Sexual activity: Never  Other Topics Concern   Not on file  Social History Narrative   Not on file   Social Determinants of Health   Financial Resource Strain: Not on file  Food Insecurity: No Food Insecurity (11/07/2022)   Hunger Vital Sign    Worried About Running Out of Food in the Last Year: Never true    Ran Out of Food in the Last Year: Never true  Transportation Needs: No Transportation Needs (11/07/2022)   PRAPARE - Hydrologist (Medical): No    Lack of Transportation (Non-Medical): No  Physical Activity: Not on file  Stress: Not on file  Social Connections: Not on file   Additional Social History:   Sleep: Good  Appetite:  Good  Current Medications: Current Facility-Administered Medications  Medication Dose Route Frequency Provider Last Rate Last Admin   acetaminophen (TYLENOL) tablet 650 mg  650 mg Oral Q6H PRN Rosezetta Schlatter, MD       alum & mag hydroxide-simeth (MAALOX/MYLANTA) 200-200-20 MG/5ML suspension 30 mL  30 mL Oral Q4H PRN Rosezetta Schlatter, MD       benztropine (COGENTIN) tablet 1 mg  1 mg Oral BID Avett Reineck, Herbert Pun I, NP   1 mg at 11/17/22 0802   cloNIDine (CATAPRES) tablet 0.1 mg  0.1 mg Oral Daily Ntuen, Tina C, FNP   0.1 mg at 11/17/22 [id number]   diphenhydrAMINE (BENADRYL) capsule 50 mg  50 mg Oral TID PRN Rosezetta Schlatter, MD   50 mg at 11/11/22 2238   Or   diphenhydrAMINE (BENADRYL) injection 50 mg  50 mg Intramuscular TID PRN Rosezetta Schlatter, MD       divalproex (DEPAKOTE ER) 24 hr tablet 500 mg  500 mg Oral Daily Ntuen, Tina C, FNP   500 mg at 11/17/22 [id number]   haloperidol (HALDOL) tablet 10 mg  10 mg Oral BID Lindell Spar I, NP   10 mg at 11/17/22 0802   haloperidol (HALDOL) tablet 5 mg  5 mg Oral TID PRN Rosezetta Schlatter, MD   5 mg at 11/10/22 1945   Or   haloperidol lactate  (HALDOL) injection 5 mg  5 mg Intramuscular TID PRN Rosezetta Schlatter, MD       hydrOXYzine (ATARAX) tablet 25 mg  25 mg Oral TID PRN Rosezetta Schlatter, MD   25 mg at 11/11/22 2217   lisinopril (ZESTRIL) tablet 10 mg  10 mg Oral Daily Lindell Spar I, NP   10 mg at 11/17/22 0802   LORazepam (ATIVAN) tablet 2 mg  2 mg Oral TID PRN Rosezetta Schlatter, MD   2 mg at 11/11/22 2238   Or   LORazepam (ATIVAN) injection 2 mg  2 mg Intramuscular TID PRN Rosezetta Schlatter, MD       magnesium hydroxide (MILK OF MAGNESIA) suspension  30 mL  30 mL Oral Daily PRN Rosezetta Schlatter, MD       nicotine (NICODERM CQ - dosed in mg/24 hours) patch 14 mg  14 mg Transdermal Daily Massengill, Nathan, MD   14 mg at 11/17/22 0803   traZODone (DESYREL) tablet 50 mg  50 mg Oral QHS PRN Rosezetta Schlatter, MD   50 mg at 11/16/22 2126   Lab Results:  No results found for this or any previous visit (from the past 83 hour(s)).  Blood Alcohol level:  Lab Results  Component Value Date   ETH <10 03/14/2021   ETH <5 [id number]   Metabolic Disorder Labs: Lab Results  Component Value Date   HGBA1C 5.5 11/07/2022   MPG 111 11/07/2022   MPG 111 03/17/2021   Lab Results  Component Value Date   PROLACTIN 33.3 (H) 03/17/2021   PROLACTIN 26.5 (H) 01/20/2015   Lab Results  Component Value Date   CHOL 210 (H) 11/07/2022   TRIG 144 11/07/2022   HDL 51 11/07/2022   CHOLHDL 4.1 11/07/2022   VLDL 29 11/07/2022   LDLCALC 130 (H) 11/07/2022   LDLCALC 102 (H) 03/17/2021   Physical Findings: AIMS:  , ,  ,  ,    CIWA:    COWS:     Musculoskeletal: Strength & Muscle Tone: within normal limits Gait & Station: normal Patient leans: N/A  Psychiatric Specialty Exam:  Presentation  General Appearance:  Appropriate for Environment; Disheveled  Eye Contact: Fair  Speech: Clear and Coherent; Normal Rate  Speech Volume: Normal  Handedness: Right  Mood and Affect  Mood: -- (Improving)  Affect: Congruent  Thought Process   Thought Processes: Coherent; Goal Directed; Linear  Descriptions of Associations:Intact  Orientation:Full (Time, Place and Person)  Thought Content:Logical  History of Schizophrenia/Schizoaffective disorder:Yes  Duration of Psychotic Symptoms:Greater than six months  Hallucinations:Hallucinations: None Description of Auditory Hallucinations: NA Description of Visual Hallucinations: NA   Ideas of Reference:None  Suicidal Thoughts:Suicidal Thoughts: No   Homicidal Thoughts:Homicidal Thoughts: No   Sensorium  Memory: Immediate Good; Recent Good; Remote Good  Judgment: Fair  Insight: Fair  Community education officer  Concentration: Good  Attention Span: Good  Recall: [place] of Knowledge: Fair  Language: Good  Psychomotor Activity  Psychomotor Activity: Psychomotor Activity: Normal   Assets  Assets: Communication Skills; Desire for Improvement; Financial Resources/Insurance; Housing; Social Support; Resilience; Physical Health  Sleep  Sleep: Sleep: Good Number of Hours of Sleep: 8  Physical Exam: Physical Exam Vitals and nursing note reviewed.  HENT:     Head: Normocephalic.     Nose: Nose normal.     Mouth/Throat:     Pharynx: Oropharynx is clear.  Eyes:     Pupils: Pupils are equal, round, and reactive to light.  Cardiovascular:     Rate and Rhythm: Normal rate.     Pulses: Normal pulses.  Pulmonary:     Effort: Pulmonary effort is normal.  Genitourinary:    Comments: Deferred Musculoskeletal:        General: Normal range of motion.     Cervical back: Normal range of motion.  Skin:    General: Skin is warm and dry.  Neurological:     General: No focal deficit present.     Mental Status: He is alert and oriented to person, place, and time.  Psychiatric:        Behavior: Behavior normal.    Review of Systems  Constitutional:  Negative for chills, diaphoresis, fever and  malaise/fatigue.  HENT:  Negative for congestion and sore  throat.   Eyes:  Negative for blurred vision.  Respiratory:  Negative for cough, shortness of breath and wheezing.   Cardiovascular:  Negative for chest pain and palpitations.  Gastrointestinal:  Negative for abdominal pain, diarrhea, heartburn, nausea and vomiting.  Genitourinary:  Negative for dysuria.  Musculoskeletal:  Negative for joint pain and myalgias.  Skin:  Negative for itching and rash.  Neurological:  Negative for dizziness, tingling, tremors, sensory change, speech change, focal weakness, seizures, loss of consciousness, weakness and headaches.  Psychiatric/Behavioral:  Positive for hallucinations. Negative for depression, memory loss, substance abuse and suicidal ideas. The patient is nervous/anxious. The patient does not have insomnia.    Blood pressure 110/85, pulse 82, temperature 97.6 F (36.4 C), temperature source Oral, resp. rate 20, height 5\' 9"  (1.753 m), weight 98.9 kg, SpO2 100 %. Body mass index is 32.19 kg/m.  Treatment Plan Summary: Daily contact with patient to assess and evaluate symptoms and progress in treatment and Medication management.   Continue inpatient hospitalization.  Will continue today 11/17/2022 plan as below except where it is noted.   Principal/active diagnoses.  Schizoaffective disorder, bipolar type ([place]).    Associated symptoms.  Mania.  Agitation.  Grandiosity.  Restlessness.  Delusional thoughts.   Other medical issues.  Hypertension.  Plan:  -Continue Haldol 10 mg po bid for mood control.  -Continue Benztropine 1 mg po bid for eps prevention.  -Continue hydroxyzine 25 mg po tid prn for anxiety. -Continue Trazodone 50 mg po Q hs prn for insomnia.  -Continue Depakote ER 500 mg p.o. daily for mood stabilization.  -Haldol decanoate 100 mg IM Q 30 days (1st dose tomorrow 11-18-22). -VPA level on 11/19/2022  Agitation protocols: Cont as recommended;  -Benadryl 50 mg po or IM tid prn. -Haldol 5 mg po or IM tid prn.  -Lorazepam  2 mg po or IM tid prn.   Other medical issues.  -Continue Lisinopril from 10 mg po daily for HTN.  -administered Clonidine 0.2 mg po x once for elevated blood pressure - 178/120.  -Initiate clonidine 0.1 mg p.o. daily for elevated blood pressure   Other PRNS -Continue Tylenol 650 mg every 6 hours PRN for mild pain -Continue Maalox 30 ml Q 4 hrs PRN for indigestion -Continue MOM 30 ml po Q 6 hrs for constipation   Safety and Monitoring: Voluntary admission to inpatient psychiatric unit for safety, stabilization and treatment Daily contact with patient to assess and evaluate symptoms and progress in treatment Patient's case to be discussed in multi-disciplinary team meeting Observation Level : q15 minute checks Vital signs: q12 hours Precautions: Safety   Discharge Planning: Social work and case management to assist with discharge planning and identification of hospital follow-up needs prior to discharge Estimated LOS: 5-7 days Discharge Concerns: Need to establish a safety plan; Medication compliance and effectiveness Discharge Goals: Return home with outpatient referrals for mental health follow-up including medication management/psychotherapy  Lindell Spar, NP 11/17/2022, 8:48 AM Patient ID: Steven Clayton, male   DOB: 1981-02-16, 42 y.o.   MRN: [id number] Patient ID: Steven Clayton, male   DOB: 08-31-1980, 42 y.o.   MRN: [id number] Patient ID: Steven Clayton, male   DOB: [birth year], 42 y.o.   MRN: [id number] Patient ID: Steven Clayton, male   DOB: 1981-04-28, 42 y.o.   MRN: [id number] Patient ID: Steven Clayton, male   DOB: 01/31/1981, 42 y.o.   MRN: [id number] Patient ID: Steven Clayton, male   DOB: 1981/05/16, 42 y.o.  MRN: [id number] Patient ID: Steven Clayton, male   DOB: October 04, 1980, 42 y.o.   MRN: [id number] Patient ID: Steven Clayton, male   DOB: 1980/09/26, 42 y.o.   MRN: [id number]

## 2022-11-17 NOTE — BH IP Treatment Plan (Signed)
Interdisciplinary Treatment and Diagnostic Plan Update  11/17/2022 Time of Session: [id number] Steven Clayton MRN: [id number]  Principal Diagnosis: Schizoaffective disorder, bipolar type  Secondary Diagnoses: Principal Problem:   Schizoaffective disorder, bipolar type ([place])   Current Medications:  Current Facility-Administered Medications  Medication Dose Route Frequency Provider Last Rate Last Admin   acetaminophen (TYLENOL) tablet 650 mg  650 mg Oral Q6H PRN Rosezetta Schlatter, MD       alum & mag hydroxide-simeth (MAALOX/MYLANTA) 200-200-20 MG/5ML suspension 30 mL  30 mL Oral Q4H PRN Rosezetta Schlatter, MD       benztropine (COGENTIN) tablet 1 mg  1 mg Oral BID Lindell Spar I, NP   1 mg at 11/17/22 [id number]   cloNIDine (CATAPRES) tablet 0.1 mg  0.1 mg Oral Daily Ntuen, Kris Hartmann, FNP   0.1 mg at 11/17/22 [id number]   diphenhydrAMINE (BENADRYL) capsule 50 mg  50 mg Oral TID PRN Rosezetta Schlatter, MD   50 mg at 11/11/22 2238   Or   diphenhydrAMINE (BENADRYL) injection 50 mg  50 mg Intramuscular TID PRN Rosezetta Schlatter, MD       divalproex (DEPAKOTE ER) 24 hr tablet 500 mg  500 mg Oral Daily Ntuen, Kris Hartmann, FNP   500 mg at 11/17/22 [id number]   haloperidol (HALDOL) tablet 10 mg  10 mg Oral BID Lindell Spar I, NP   10 mg at 11/17/22 0802   haloperidol (HALDOL) tablet 5 mg  5 mg Oral TID PRN Rosezetta Schlatter, MD   5 mg at 11/10/22 1945   Or   haloperidol lactate (HALDOL) injection 5 mg  5 mg Intramuscular TID PRN Rosezetta Schlatter, MD       hydrOXYzine (ATARAX) tablet 25 mg  25 mg Oral TID PRN Rosezetta Schlatter, MD   25 mg at 11/11/22 2217   lisinopril (ZESTRIL) tablet 10 mg  10 mg Oral Daily Lindell Spar I, NP   10 mg at 11/17/22 0802   LORazepam (ATIVAN) tablet 2 mg  2 mg Oral TID PRN Rosezetta Schlatter, MD   2 mg at 11/11/22 2238   Or   LORazepam (ATIVAN) injection 2 mg  2 mg Intramuscular TID PRN Rosezetta Schlatter, MD       magnesium hydroxide (MILK OF MAGNESIA) suspension 30 mL  30 mL Oral Daily PRN Rosezetta Schlatter, MD        nicotine (NICODERM CQ - dosed in mg/24 hours) patch 14 mg  14 mg Transdermal Daily Massengill, Ovid Curd, MD   14 mg at 11/17/22 0803   traZODone (DESYREL) tablet 50 mg  50 mg Oral QHS PRN Rosezetta Schlatter, MD   50 mg at 11/16/22 2126   PTA Medications: Medications Prior to Admission  Medication Sig Dispense Refill Last Dose   acetaminophen (TYLENOL) 325 MG tablet Take 2 tablets (650 mg total) by mouth every 6 (six) hours as needed for mild pain.      alum & mag hydroxide-simeth (MAALOX/MYLANTA) 200-200-20 MG/5ML suspension Take 30 mLs by mouth every 4 (four) hours as needed for indigestion. 355 mL 0    benztropine (COGENTIN) 0.5 MG tablet Take 1 tablet (0.5 mg total) by mouth 2 (two) times daily. 60 tablet 2    divalproex (DEPAKOTE ER) 500 MG 24 hr tablet Take 1 tablet (500 mg total) by mouth at bedtime. 30 tablet 2    haloperidol (HALDOL) 10 MG tablet Take 1 tablet (10 mg total) by mouth once nightly at bedtime. 30 tablet 2    magnesium hydroxide (MILK OF MAGNESIA) 400  MG/5ML suspension Take 30 mLs by mouth daily as needed for mild constipation. 355 mL 0    OVER THE COUNTER MEDICATION Take 1 tablet by mouth daily. Vitafusion Multivitamin Gummy Vitamins       Patient Stressors: Traumatic event    Patient Strengths: Physical Health   Treatment Modalities: Medication Management, Group therapy, Case management,  1 to 1 session with clinician, Psychoeducation, Recreational therapy.   Physician Treatment Plan for Primary Diagnosis: Schizoaffective disorder, bipolar type Long Term Goal(s): Improvement in symptoms so as ready for discharge   Short Term Goals: Ability to identify and develop effective coping behaviors will improve Ability to maintain clinical measurements within normal limits will improve Compliance with prescribed medications will improve Ability to identify triggers associated with substance abuse/mental health issues will improve Ability to identify changes in lifestyle to  reduce recurrence of condition will improve Ability to verbalize feelings will improve Ability to disclose and discuss suicidal ideas Ability to demonstrate self-control will improve  Medication Management: Evaluate patient's response, side effects, and tolerance of medication regimen.  Therapeutic Interventions: 1 to 1 sessions, Unit Group sessions and Medication administration.  Evaluation of Outcomes: Progressing  Physician Treatment Plan for Secondary Diagnosis: Principal Problem:   Schizoaffective disorder, bipolar type ([place])  Long Term Goal(s): Improvement in symptoms so as ready for discharge   Short Term Goals: Ability to identify and develop effective coping behaviors will improve Ability to maintain clinical measurements within normal limits will improve Compliance with prescribed medications will improve Ability to identify triggers associated with substance abuse/mental health issues will improve Ability to identify changes in lifestyle to reduce recurrence of condition will improve Ability to verbalize feelings will improve Ability to disclose and discuss suicidal ideas Ability to demonstrate self-control will improve     Medication Management: Evaluate patient's response, side effects, and tolerance of medication regimen.  Therapeutic Interventions: 1 to 1 sessions, Unit Group sessions and Medication administration.  Evaluation of Outcomes: Progressing   RN Treatment Plan for Primary Diagnosis: Schizoaffective disorder, bipolar type Long Term Goal(s): Knowledge of disease and therapeutic regimen to maintain health will improve  Short Term Goals: Ability to remain free from injury will improve, Ability to verbalize frustration and anger appropriately will improve, Ability to demonstrate self-control, Ability to participate in decision making will improve, Ability to verbalize feelings will improve, Ability to disclose and discuss suicidal ideas, Ability to identify and  develop effective coping behaviors will improve, and Compliance with prescribed medications will improve  Medication Management: RN will administer medications as ordered by provider, will assess and evaluate patient's response and provide education to patient for prescribed medication. RN will report any adverse and/or side effects to prescribing provider.  Therapeutic Interventions: 1 on 1 counseling sessions, Psychoeducation, Medication administration, Evaluate responses to treatment, Monitor vital signs and CBGs as ordered, Perform/monitor CIWA, COWS, AIMS and Fall Risk screenings as ordered, Perform wound care treatments as ordered.  Evaluation of Outcomes: Progressing   LCSW Treatment Plan for Primary Diagnosis: Schizoaffective disorder, bipolar type Long Term Goal(s): Safe transition to appropriate next level of care at discharge, Engage patient in therapeutic group addressing interpersonal concerns.  Short Term Goals: Engage patient in aftercare planning with referrals and resources, Increase social support, Increase ability to appropriately verbalize feelings, Increase emotional regulation, Facilitate acceptance of mental health diagnosis and concerns, Facilitate patient progression through stages of change regarding substance use diagnoses and concerns, Identify triggers associated with mental health/substance abuse issues, and Increase skills for wellness and recovery  Therapeutic Interventions: Assess for all discharge needs, 1 to 1 time with Education officer, museum, Explore available resources and support systems, Assess for adequacy in community support network, Educate family and significant other(s) on suicide prevention, Complete Psychosocial Assessment, Interpersonal group therapy.  Evaluation of Outcomes: Progressing   Progress in Treatment: Attending groups: Yes. Participating in groups: Yes. Taking medication as prescribed: Yes. Toleration medication: Yes. Family/Significant other  contact made: No, will contact:  Family Member  Patient understands diagnosis: Yes. Discussing patient identified problems/goals with staff: Yes. Medical problems stabilized or resolved: Yes. Denies suicidal/homicidal ideation: Yes. Issues/concerns per patient self-inventory: Yes. Other:   New problem(s) identified: No, Describe:  None reported  New Short Term/Long Term Goal(s):medication stabilization, elimination of SI thoughts, development of comprehensive mental wellness plan.   Patient Goals:  Medication Stabilization  Discharge Plan or Barriers: . CSW will continue to follow and assess for appropriate referrals and possible discharge planning.   Reason for Continuation of Hospitalization: Anxiety Delusions  Depression Hallucinations Medication stabilization  Estimated Length of Stay: 3-7 Days  Last [place] Suicide Severity Risk Score: [place] Admission (Current) from 11/07/2022 in [place] [facility] Most recent reading at 11/07/2022  6:00 PM ED from 11/07/2022 in [facility] Most recent reading at 11/07/2022  3:14 AM Video Visit from 04/07/2022 in [facility] Most recent reading at 04/07/2022  3:44 PM  C-SSRS RISK CATEGORY No Risk No Risk No Risk       Last PHQ 2/9 Scores:    04/07/2022    3:44 PM 12/02/2021    3:31 PM 10/07/2021    2:34 PM  Depression screen PHQ 2/9  Decreased Interest 1 1 1   Down, Depressed, Hopeless 1 1 0  PHQ - 2 Score 2 2 1   Altered sleeping 2 2   Tired, decreased energy 1 2   Change in appetite 2 2   Feeling bad or failure about yourself  2 2   Trouble concentrating 1 1   Moving slowly or fidgety/restless 1 1   Suicidal thoughts 1 1   PHQ-9 Score 12 13   Difficult doing work/chores Somewhat difficult Somewhat difficult    medication stabilization, elimination of SI thoughts, development of comprehensive mental wellness plan.   Scribe for Treatment  Team: Windle Guard, LCSW 11/17/2022 1:02 PM

## 2022-11-17 NOTE — Group Note (Signed)
Recreation Therapy Group Note   Group Topic:Stress Management  Group Date: 11/17/2022 Start Time: 0930 End Time: 0955 Facilitators: Thi Klich-McCall, LRT,CTRS Location: [address] Day[address]   Goal Area(s) Addresses:  Patient will identify positive stress management techniques. Patient will identify benefits of using stress management post d/c.  Group Description:  Meditation.  LRT explained to patients what group would consist of, they should focus on their breathing and be in a comfortable position.  LRT proceeded to play a meditation called 'Stop Holding Yourself Back'.  The meditation wanted patients to identify habits they that have prevented them from the life they want for themselves.  It also encouraged to hold onto the positive image they have of their future selves and to working to get to that point.   Affect/Mood: Appropriate   Participation Level: Engaged   Participation Quality: Independent   Behavior: Appropriate   Speech/Thought Process: Focused   Insight: Good   Judgement: Good   Modes of Intervention: App   Patient Response to Interventions:  Attentive   Education Outcome:  Acknowledges education and In group clarification offered    Clinical Observations/Individualized Feedback: Pt attended and participated in group session.     Plan: Continue to engage patient in RT group sessions 2-3x/week.   Airi Copado-McCall, LRT,CTRS 11/17/2022 12:38 PM

## 2022-11-17 NOTE — BHH Counselor (Addendum)
BHH/BMU LCSW Progress Note   11/17/2022    1:04 PM  Steven Clayton      Type of Note: Collateral DC Planning    CSW reached out to patient mom to provide an update about ACT Tem through strategic interventions and patient possible DC this weekend or early part of next week, Monday . Patient mom did not answer, CSW left a voicemail. CSW will continue to assist.    Mom returned CSW call and asked if patient could be DC on Monday due to her working and then not having the new key to patient apartment which they can oinly give to patient. Mom is also onboard with ACT team coming out to home if they cannot start services while patient is here.    Signed:   Silas Flood, MSW, [facility] 11/17/2022 1:04 PM

## 2022-11-17 NOTE — Plan of Care (Signed)
  Problem: Education: Goal: Knowledge of [facility] Education information/materials will improve Outcome: Progressing Goal: Emotional status will improve Outcome: Progressing Goal: Mental status will improve Outcome: Progressing Goal: Verbalization of understanding the information provided will improve Outcome: Progressing   Problem: Activity: Goal: Interest or engagement in activities will improve Outcome: Progressing Goal: Sleeping patterns will improve Outcome: Progressing   Problem: Coping: Goal: Ability to verbalize frustrations and anger appropriately will improve Outcome: Progressing Goal: Ability to demonstrate self-control will improve Outcome: Progressing   Problem: Health Behavior/Discharge Planning: Goal: Identification of resources available to assist in meeting health care needs will improve Outcome: Progressing Goal: Compliance with treatment plan for underlying cause of condition will improve Outcome: Progressing   Problem: Physical Regulation: Goal: Ability to maintain clinical measurements within normal limits will improve Outcome: Progressing   Problem: Safety: Goal: Periods of time without injury will increase Outcome: Progressing   

## 2022-11-17 NOTE — BHH Group Notes (Signed)
Adult Psychoeducational Group Note  Date:  11/17/2022 Time:  12:59 PM  Group Topic/Focus:  Goals Group:   The focus of this group is to help patients establish daily goals to achieve during treatment and discuss how the patient can incorporate goal setting into their daily lives to aide in recovery.  Participation Level:  Active  Participation Quality:  Appropriate  Affect:  Appropriate  Cognitive:  Appropriate  Insight: Appropriate  Engagement in Group:  Engaged  Modes of Intervention:  Discussion  Additional Comments:  Pt joined the group after it already started, but he stated that his goal for today is to work on discharge and figuring out housing situation.  Steven Clayton December 11/17/2022, 12:59 PM

## 2022-11-18 NOTE — Plan of Care (Signed)
  Problem: Safety: Goal: Periods of time without injury will increase Outcome: Progressing   

## 2022-11-18 NOTE — BHH Group Notes (Signed)
Pt did not attend goals group. 

## 2022-11-18 NOTE — Progress Notes (Signed)
"  Tadao" rates sleep as "Good". He denies SI/HI/AVH. Pt slept for a 9 hrs last night. Pt is asking about discharge for monday. Pt was requesting to talk to a LCSW about current housing update. Pt received haldol LAI this a.m. Pt was blunted on approach. Pt remains safe.

## 2022-11-18 NOTE — Progress Notes (Signed)
   11/18/22 2100  Psych Admission Type (Psych Patients Only)  Admission Status Voluntary  Psychosocial Assessment  Patient Complaints None  Eye Contact Fair  Facial Expression Anxious  Affect Anxious  Speech Logical/coherent  Interaction Assertive  Motor Activity Fidgety  Appearance/Hygiene Disheveled  Behavior Characteristics Cooperative  Mood Pleasant  Thought Process  Coherency Circumstantial  Content WDL  Delusions None reported or observed  Perception WDL  Hallucination None reported or observed  Judgment Poor  Confusion None  Danger to Self  Current suicidal ideation? Denies  Agreement Not to Harm Self Yes  Description of Agreement verbal  Danger to Others  Danger to Others None reported or observed   Alert/oriented. Makes needs/concerns known to staff. Pleasant cooperative with staff. Denies SI/HI/A/V hallucinations. Med compliant.  Patient states went to group. Will encourage continued compliance and progression towards goals. Verbally contracted for safety. Will continue to monitor.

## 2022-11-18 NOTE — Progress Notes (Signed)
BHH/BMU LCSW Progress Note   11/18/2022    3:38 PM  Steven Clayton      Type of Note: Medicare Notice  Patient informed of right to appeal discharge, provided phone number to [facility]. Patient expressed no interest in appealing discharge at this time. CSW will continue to monitor situation.     Signed:   Silas Flood, MSW, [facility] 11/18/2022 3:38 PM
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
## 2022-11-18 NOTE — BHH Group Notes (Signed)
[place] [facility] Group Notes:  (Nursing)  Date:  11/18/2022  Time:  400 PM  Type of Therapy:  Nurse Education  Participation Level:  Did Not Attend  :  Waymond Cera 11/18/2022, 7:27 PM

## 2022-11-18 NOTE — BHH Group Notes (Signed)
[place] Group Notes:  (Nursing/MHT/Case Management/Adjunct)  Date:  11/18/2022  Time:  9:16 PM  Type of Therapy:  Group Therapy  Participation Level:  Active  Participation Quality:  Appropriate  Affect:  Appropriate  Cognitive:  Appropriate  Insight:  Appropriate  Engagement in Group:  Engaged  Modes of Intervention:  Education  Summary of Progress/Problems: Goal to work on housing, Day 5/10. Participated in anger management exercise.   Orvan Falconer 11/18/2022, 9:16 PM

## 2022-11-19 NOTE — Group Note (Signed)
Date:  11/19/2022 Time:  5:12 PM  Group Topic/Focus:  Spirituality:   The focus of this group is to discuss how one's spirituality can aide in recovery.    Participation Level:  Active  Participation Quality:  Appropriate  Affect:  Appropriate  Cognitive:  Appropriate  Insight: Appropriate  Engagement in Group:  Engaged  Modes of Intervention:  Exploration  Additional Comments:     Jerrye Beavers 11/19/2022, 5:12 PM

## 2022-11-19 NOTE — Progress Notes (Addendum)
Patient denies SI, HI and AVH this shift. Patients reports feeling unsafe to return to his living situation.  Patient has exhibited no behavioral dyscontrol continue to monitor as planned.    Assess patient for safety, offer medications as prescribed, engage patient in 1:1 staff talks, continue to monitor as planned.   Patient able to contract for safety. Continue to monitor as planned.

## 2022-11-19 NOTE — Group Note (Signed)
Date:  11/19/2022 Time:  9:59 AM  Group Topic/Focus:  Orientation:   The focus of this group is to educate the patient on the purpose and policies of crisis stabilization and provide a format to answer questions about their admission.  The group details unit policies and expectations of patients while admitted.    Participation Level:  Active  Participation Quality:  Appropriate  Affect:  Appropriate  Cognitive:  Appropriate  Insight: Appropriate  Engagement in Group:  Engaged  Modes of Intervention:  Discussion  Additional Comments:     Jerrye Beavers 11/19/2022, 9:59 AM

## 2022-11-19 NOTE — Progress Notes (Signed)
[facility] MD Progress Note  11/19/2022 12:29 PM Steven Clayton  MRN:  [id number]  Reason for admission: 42 year old AA male with hx of metal illness & probable hx of substance use disorders. He is known in this [facility] with complain of worsening symptoms of Schizoaffective disorder, bipolar type. Patient was a patient in this [facility] last in July of 2022. He is being admitted to [facility] this time around with complain of increased paranoia/delusional thinking. Patient apparently had called the cops seeking a protective custody as he felt there were some people after him. After the cops picked him-up, he was taken to the [facility] for evaluation & was later transferred to the [facility] for further psychiatric evaluation/treatments.   24-hour chart review: Vital signs within normal limit, patient compliant with scheduled medications, required trazodone 50 mg last night for sleep. Slept through the night last night as per nursing documentation.  Received Haldol Decanoate 100 mg LAI yesterday morning (11/18/22) morning. No behavioral episodes noted in the past 24 hrs.  Daily notes:  Pt presents today with a less depressed mood. His attention to personal hygiene and grooming is fair, eye contact is good, speech is clear & coherent. Thought contents are organized and logical, and pt currently denies SI/HI/AVH or paranoia. He denies all psychosis today, there is no evidence of delusional thoughts.    He reports that his sleep quality is good, reports that his appetite is good, reports that his anxiety is 4, 10 being worst, and states that his depression is 3, 10 being worst. He denies medication related side effects and states that he is continuing to report tolerating current medications well with no stiffness or EPS type symptoms & no other side effects. Denies trouble with his bowel movements. There is no evidence of psychosis today.   Pt is going to be discharged, writer reached out to patient's mother who states that the lock to his  apartment has been changed, and patient does not have a key, and therefore does not have access to his apartment.  Patient's mother states that she will only be able to go to the apartment complex tomorrow to get the key and bring it here to [facility]. She states that she is not able to go there now because she is currently at work, and will not be able to go there first thing in the morning tomorrow because she has to work. Pt's mother has been notified to bring the key to [facility] as soon as she picks it up because plan is to discharge pt tomorrow morning (11/19/22). Mother verbalized understanding. We will continue medications as listed below.  Principal Problem: Schizoaffective disorder, bipolar type  Diagnosis: Principal Problem:   Schizoaffective disorder, bipolar type ([place])  Total Time spent with patient:  35 minutes  Past Psychiatric History: See H&P.  Past Medical History:  Past Medical History:  Diagnosis Date   Anxiety    Depression    Schizoaffective disorder ([place])    Stroke [facility])    History reviewed. No pertinent surgical history.  Family History:  Family History  Problem Relation Age of Onset   Heart disease Mother    Hyperlipidemia Mother    Mental illness Father    Family Psychiatric  History: See H&P.  Social History:  Social History   Substance and Sexual Activity  Alcohol Use Yes   Alcohol/week: 1.0 standard drink of alcohol   Types: 1 Glasses of wine per week     Social History   Substance  and Sexual Activity  Drug Use No    Social History   Socioeconomic History   Marital status: Single    Spouse name: Not on file   Number of children: Not on file   Years of education: Not on file   Highest education level: Not on file  Occupational History   Not on file  Tobacco Use   Smoking status: Some Days    Packs/day: 2    Types: Cigarettes   Smokeless tobacco: Never  Vaping Use   Vaping Use: Never used  Substance and Sexual Activity   Alcohol use: Yes     Alcohol/week: 1.0 standard drink of alcohol    Types: 1 Glasses of wine per week   Drug use: No   Sexual activity: Never  Other Topics Concern   Not on file  Social History Narrative   Not on file   Social Determinants of Health   Financial Resource Strain: Not on file  Food Insecurity: No Food Insecurity (11/07/2022)   Hunger Vital Sign    Worried About Running Out of Food in the Last Year: Never true    Ran Out of Food in the Last Year: Never true  Transportation Needs: No Transportation Needs (11/07/2022)   PRAPARE - Hydrologist (Medical): No    Lack of Transportation (Non-Medical): No  Physical Activity: Not on file  Stress: Not on file  Social Connections: Not on file   Additional Social History:   Sleep: Good  Appetite:  Good  Current Medications: Current Facility-Administered Medications  Medication Dose Route Frequency Provider Last Rate Last Admin   acetaminophen (TYLENOL) tablet 650 mg  650 mg Oral Q6H PRN Rosezetta Schlatter, MD       alum & mag hydroxide-simeth (MAALOX/MYLANTA) 200-200-20 MG/5ML suspension 30 mL  30 mL Oral Q4H PRN Rosezetta Schlatter, MD       benztropine (COGENTIN) tablet 1 mg  1 mg Oral BID Lindell Spar I, NP   1 mg at 11/19/22 0747   cloNIDine (CATAPRES) tablet 0.1 mg  0.1 mg Oral Daily Ntuen, Tina C, FNP   0.1 mg at 11/19/22 0747   diphenhydrAMINE (BENADRYL) capsule 50 mg  50 mg Oral TID PRN Rosezetta Schlatter, MD   50 mg at 11/11/22 2238   Or   diphenhydrAMINE (BENADRYL) injection 50 mg  50 mg Intramuscular TID PRN Rosezetta Schlatter, MD       divalproex (DEPAKOTE ER) 24 hr tablet 500 mg  500 mg Oral Daily Ntuen, Tina C, FNP   500 mg at 11/19/22 0747   haloperidol (HALDOL) tablet 10 mg  10 mg Oral BID Lindell Spar I, NP   10 mg at 11/19/22 0747   haloperidol (HALDOL) tablet 5 mg  5 mg Oral TID PRN Rosezetta Schlatter, MD   5 mg at 11/10/22 1945   Or   haloperidol lactate (HALDOL) injection 5 mg  5 mg Intramuscular TID PRN Rosezetta Schlatter, MD       haloperidol decanoate (HALDOL DECANOATE) 100 MG/ML injection 100 mg  100 mg Intramuscular Q30 days Lindell Spar I, NP   100 mg at 11/18/22 [id number]   hydrOXYzine (ATARAX) tablet 25 mg  25 mg Oral TID PRN Rosezetta Schlatter, MD   25 mg at 11/11/22 2217   lisinopril (ZESTRIL) tablet 10 mg  10 mg Oral Daily Lindell Spar I, NP   10 mg at 11/19/22 0747   LORazepam (ATIVAN) tablet 2 mg  2 mg Oral TID  PRN Rosezetta Schlatter, MD   2 mg at 11/11/22 2238   Or   LORazepam (ATIVAN) injection 2 mg  2 mg Intramuscular TID PRN Rosezetta Schlatter, MD       magnesium hydroxide (MILK OF MAGNESIA) suspension 30 mL  30 mL Oral Daily PRN Rosezetta Schlatter, MD       nicotine (NICODERM CQ - dosed in mg/24 hours) patch 14 mg  14 mg Transdermal Daily Massengill, Ovid Curd, MD   14 mg at 11/19/22 0748   traZODone (DESYREL) tablet 50 mg  50 mg Oral QHS PRN Rosezetta Schlatter, MD   50 mg at 11/18/22 2216   Lab Results:  No results found for this or any previous visit (from the past 61 hour(s)).  Blood Alcohol level:  Lab Results  Component Value Date   ETH <10 03/14/2021   ETH <5 [id number]   Metabolic Disorder Labs: Lab Results  Component Value Date   HGBA1C 5.5 11/07/2022   MPG 111 11/07/2022   MPG 111 03/17/2021   Lab Results  Component Value Date   PROLACTIN 33.3 (H) 03/17/2021   PROLACTIN 26.5 (H) 01/20/2015   Lab Results  Component Value Date   CHOL 210 (H) 11/07/2022   TRIG 144 11/07/2022   HDL 51 11/07/2022   CHOLHDL 4.1 11/07/2022   VLDL 29 11/07/2022   LDLCALC 130 (H) 11/07/2022   LDLCALC 102 (H) 03/17/2021   Physical Findings: AIMS:  , ,  ,  ,    CIWA:    COWS:     Musculoskeletal: Strength & Muscle Tone: within normal limits Gait & Station: normal Patient leans: N/A  Psychiatric Specialty Exam:  Presentation  General Appearance:  Appropriate for Environment; Fairly Groomed  Eye Contact: Good  Speech: Clear and Coherent  Speech  Volume: Normal  Handedness: Right  Mood and Affect  Mood: Depressed  Affect: Congruent  Thought Process  Thought Processes: Coherent  Descriptions of Associations:Intact  Orientation:Full (Time, Place and Person)  Thought Content:Logical  History of Schizophrenia/Schizoaffective disorder:Yes  Duration of Psychotic Symptoms:Greater than six months  Hallucinations:Hallucinations: None    Ideas of Reference:None  Suicidal Thoughts:Suicidal Thoughts: No    Homicidal Thoughts:Homicidal Thoughts: No    Sensorium  Memory: Immediate Good  Judgment: Fair  Insight: Fair  Community education officer  Concentration: Good  Attention Span: Good  Recall: [place] of Knowledge: Fair  Language: Fair  Psychomotor Activity  Psychomotor Activity: Psychomotor Activity: Normal    Assets  Assets: Communication Skills  Sleep  Sleep: Sleep: Good   Physical Exam: Physical Exam Vitals and nursing note reviewed.  HENT:     Head: Normocephalic.     Nose: Nose normal.     Mouth/Throat:     Pharynx: Oropharynx is clear.  Eyes:     Pupils: Pupils are equal, round, and reactive to light.  Cardiovascular:     Rate and Rhythm: Normal rate.     Pulses: Normal pulses.  Pulmonary:     Effort: Pulmonary effort is normal.  Genitourinary:    Comments: Deferred Musculoskeletal:        General: Normal range of motion.     Cervical back: Normal range of motion.  Skin:    General: Skin is warm and dry.  Neurological:     General: No focal deficit present.     Mental Status: He is alert and oriented to person, place, and time.  Psychiatric:        Behavior: Behavior normal.    Review  of Systems  Constitutional:  Negative for chills, diaphoresis, fever and malaise/fatigue.  HENT:  Negative for congestion and sore throat.   Eyes:  Negative for blurred vision.  Respiratory:  Negative for cough, shortness of breath and wheezing.   Cardiovascular:  Negative  for chest pain and palpitations.  Gastrointestinal:  Negative for abdominal pain, diarrhea, heartburn, nausea and vomiting.  Genitourinary:  Negative for dysuria.  Musculoskeletal:  Negative for joint pain and myalgias.  Skin:  Negative for itching and rash.  Neurological:  Negative for dizziness, tingling, tremors, sensory change, speech change, focal weakness, seizures, loss of consciousness, weakness and headaches.  Psychiatric/Behavioral:  Positive for depression and hallucinations. Negative for memory loss, substance abuse and suicidal ideas. The patient is nervous/anxious and has insomnia.    Blood pressure (!) 120/95, pulse 78, temperature 98.3 F (36.8 C), temperature source Oral, resp. rate 20, height 5\' 9"  (1.753 m), weight 98.9 kg, SpO2 99 %. Body mass index is 32.19 kg/m.  Treatment Plan Summary: Daily contact with patient to assess and evaluate symptoms and progress in treatment and Medication management.   Continue inpatient hospitalization.  Will continue today 11/19/2022 plan as below except where it is noted.   Principal/active diagnoses.  Schizoaffective disorder, bipolar type ([place]).    Associated symptoms.  Mania.  Agitation.  Grandiosity.  Restlessness.  Delusional thoughts.   Other medical issues.  Hypertension.  Plan:  -Continue Haldol 10 mg po bid for mood control/psychosis  -Continue Benztropine 1 mg po bid for eps prevention.  -Continue hydroxyzine 25 mg po tid prn for anxiety. -Continue Trazodone 50 mg po Q hs prn for insomnia.  -Continue Depakote ER 500 mg p.o. daily for mood stabilization.  -Haldol decanoate 100 mg IM Q 30 days (1st dose given  11-18-22). -VPA level scheduled to be drawn on 11/20/2022  Agitation protocols: Cont as recommended;  -Benadryl 50 mg po or IM tid prn. -Haldol 5 mg po or IM tid prn.  -Lorazepam 2 mg po or IM tid prn.   Other medical issues.  -Continue Lisinopril from 10 mg po daily for HTN.  -administered Clonidine  0.2 mg po x once for elevated blood pressure - 178/120.  -Initiate clonidine 0.1 mg p.o. daily for elevated blood pressure   Other PRNS -Continue Tylenol 650 mg every 6 hours PRN for mild pain -Continue Maalox 30 ml Q 4 hrs PRN for indigestion -Continue MOM 30 ml po Q 6 hrs for constipation   Safety and Monitoring: Voluntary admission to inpatient psychiatric unit for safety, stabilization and treatment Daily contact with patient to assess and evaluate symptoms and progress in treatment Patient's case to be discussed in multi-disciplinary team meeting Observation Level : q15 minute checks Vital signs: q12 hours Precautions: Safety   Discharge Planning: Social work and case management to assist with discharge planning and identification of hospital follow-up needs prior to discharge Estimated LOS: 5-7 days Discharge Concerns: Need to establish a safety plan; Medication compliance and effectiveness Discharge Goals: Return home with outpatient referrals for mental health follow-up including medication management/psychotherapy  Nicholes Rough, NP 11/19/2022, 12:29 PM Patient ID: Steven Clayton, male   DOB: Feb 23, 1981, 42 y.o.   MRN: [id number]

## 2022-11-19 NOTE — Group Note (Signed)
Date:  11/19/2022 Time:  10:19 AM  Group Topic/Focus:  Dimensions of Wellness:   The focus of this group is to introduce the topic of wellness and discuss the role each dimension of wellness plays in total health.    Participation Level:  Active  Participation Quality:  Attentive  Affect:  Flat  Cognitive:  Lacking  Insight: Limited  Engagement in Group:  Limited  Modes of Intervention:  Exploration  Additional Comments:     Jerrye Beavers 11/19/2022, 10:19 AM

## 2022-11-20 ENCOUNTER — Other Ambulatory Visit: Payer: Self-pay

## 2022-11-20 LAB — VALPROIC ACID LEVEL: Valproic Acid Lvl: 46 ug/mL — ABNORMAL LOW (ref 50.0–100.0)

## 2022-11-20 MED ORDER — HALOPERIDOL DECANOATE 100 MG/ML IM SOLN
100.0000 mg | INTRAMUSCULAR | 0 refills | Status: DC
  Filled 2022-11-20: qty 1, 30d supply, fill #0

## 2022-11-20 MED ORDER — BENZTROPINE MESYLATE 1 MG PO TABS
1.0000 mg | ORAL_TABLET | Freq: Two times a day (BID) | ORAL | 0 refills | Status: DC
  Filled 2022-11-20: qty 60, 30d supply, fill #0

## 2022-11-20 MED ORDER — HYDROXYZINE HCL 25 MG PO TABS
25.0000 mg | ORAL_TABLET | Freq: Three times a day (TID) | ORAL | 0 refills | Status: DC | PRN
  Filled 2022-11-20: qty 15, 5d supply, fill #0

## 2022-11-20 MED ORDER — TRAZODONE HCL 50 MG PO TABS
50.0000 mg | ORAL_TABLET | Freq: Every evening | ORAL | 0 refills | Status: DC | PRN
  Filled 2022-11-20: qty 30, 30d supply, fill #0

## 2022-11-20 MED ORDER — DIVALPROEX SODIUM ER 500 MG PO TB24
500.0000 mg | ORAL_TABLET | Freq: Every day | ORAL | 0 refills | Status: DC
  Filled 2022-11-20: qty 30, 30d supply, fill #0

## 2022-11-20 MED ORDER — CLONIDINE HCL 0.1 MG PO TABS
0.1000 mg | ORAL_TABLET | Freq: Every day | ORAL | 0 refills | Status: DC
  Filled 2022-11-20: qty 30, 30d supply, fill #0

## 2022-11-20 MED ORDER — NICOTINE 14 MG/24HR TD PT24
14.0000 mg | MEDICATED_PATCH | Freq: Every day | TRANSDERMAL | 0 refills | Status: DC
  Filled 2022-11-20: qty 28, 28d supply, fill #0

## 2022-11-20 MED ORDER — LISINOPRIL 10 MG PO TABS
10.0000 mg | ORAL_TABLET | Freq: Every day | ORAL | 0 refills | Status: DC
  Filled 2022-11-20: qty 30, 30d supply, fill #0

## 2022-11-20 MED ORDER — HALOPERIDOL 10 MG PO TABS
10.0000 mg | ORAL_TABLET | Freq: Two times a day (BID) | ORAL | 0 refills | Status: DC
  Filled 2022-11-20: qty 42, 21d supply, fill #0

## 2022-11-20 NOTE — Group Note (Signed)
Recreation Therapy Group Note   Group Topic:Health and Wellness  Group Date: 11/20/2022 Start Time: 0930 End Time: 1000 Facilitators: Chisum Habenicht-McCall, LRT,CTRS Location: [address] Dayroom   Goal Area(s) Addresses:  Patient will verbalize benefit of exercise during group session. Patient will identify an exercise that can be completed post d/c. Patient will acknowledge benefits of exercise when used as a coping mechanism.   Group Description:  Exercise.  LRT introduced the activity of exercise patients.  LRT explained to patients they would be leading the activity by choosing the exercises that were completed by the group.  LRT and group completed three rounds of exercises.  Patients were instructed to take breaks if needed and listen to their bodies so as to not do any exercises that are outside of their ability.   Affect/Mood: N/A   Participation Level: Did not attend    Clinical Observations/Individualized Feedback:     Plan: Continue to engage patient in RT group sessions 2-3x/week.   Ammar Moffatt-McCall, LRT,CTRS  11/20/2022 1:21 PM

## 2022-11-20 NOTE — BHH Group Notes (Signed)
PsychoEducational Group Note. Patients were given poem by Cristopher Peru, titled '' There's a hole in my sidewalk'' as it describes repeating negative behavioral patterns. Patients were then asked to reflect on negative patterns in their own life they would like to change, using reflection and positive reframing.  Pt attended but thought content shared was very concrete and disorganized.

## 2022-11-20 NOTE — Plan of Care (Signed)
  Problem: Education: Goal: Emotional status will improve Outcome: Progressing Goal: Mental status will improve Outcome: Progressing   Problem: Health Behavior/Discharge Planning: Goal: Identification of resources available to assist in meeting health care needs will improve Outcome: Progressing Goal: Compliance with treatment plan for underlying cause of condition will improve Outcome: Progressing   Problem: Safety: Goal: Periods of time without injury will increase Outcome: Progressing

## 2022-11-20 NOTE — Progress Notes (Signed)
  [facility] Adult Case Management Discharge Plan :  Will you be returning to the same living situation after discharge:  Yes,  patient will return back to his apartment  At discharge, do you have transportation home?: Yes,  Patient mom will be picking him up  Do you have the ability to pay for your medications: Yes,  patient has [employer]   Release of information consent forms completed and in the chart;  Patient's signature needed at discharge.  Patient to Follow up at:  [facility]. Go on 12/05/2022.   Specialty: Urgent Care Why: You have a scheduled medication management appointment with this provider on 12/05/2022 at 10:00 AM, please speak with them on this day or sooner to get a scheduled therpay appointment. Contact information: [place]. Call on 11/21/2022.   Why: Please reach out to this provider on 11/21/2022 to follow up with the necessary paperwork to start ACT services. This provider was unable to come in to visit you during your stay but has your referral paperwork. Contact information: [place] [address] [phone]                 Next level of care provider has access to [place] and Suicide Prevention discussed: Tawana Scale (Mother) [phone]     Has patient been referred to the Quitline?: Patient refused referral  Patient has been referred for addiction treatment: N/A. Patient does not have an addiction to need a referral   Charlett Lango 11/20/2022, 12:08 PM

## 2022-11-20 NOTE — Group Note (Unsigned)
Date:  11/20/2022 Time:  1:37 PM  Group Topic/Focus:  Wellness Toolbox:   The focus of this group is to discuss various aspects of wellness, balancing those aspects and exploring ways to increase the ability to experience wellness.  Patients will create a wellness toolbox for use upon discharge.     Participation Level:  {BHH PARTICIPATION WO:[id number]  Participation Quality:  {BHH PARTICIPATION QUALITY:22265}  Affect:  {BHH AFFECT:22266}  Cognitive:  {BHH COGNITIVE:22267}  Insight: {BHH Insight2:20797}  Engagement in Group:  {BHH ENGAGEMENT IN BP:[id number]  Modes of Intervention:  {BHH MODES OF INTERVENTION:22269}  Additional Comments:  ***  Garvin Fila 11/20/2022, 1:37 PM

## 2022-11-20 NOTE — Progress Notes (Signed)
   11/20/22 0615  15 Minute Checks  Location Bedroom  Visual Appearance Calm  Behavior Composed  Sleep (Behavioral Health Patients Only)  Calculate sleep? (Click Yes once per 24 hr at 0600 safety check) Yes  Documented sleep last 24 hours 8.5

## 2022-11-20 NOTE — Progress Notes (Signed)
Discharge note: RN met with pt and reviewed pt's discharge instructions. Pt verbalized understanding of discharge instructions and pt did not have any questions. RN reviewed and provided pt with a copy of suicide safety plan, SRA, AVS and Transition Record. RN returned pt's belongings to pt. Pt denied SI/HI/AVH and voiced no concerns. Pt was appreciative of the care pt received at [facility]. Patient discharged to the lobby without incident.

## 2022-11-20 NOTE — Discharge Summary (Signed)
Physician Discharge Summary Note  Patient:  Steven Clayton is an 42 y.o., male MRN:  [id number] DOB:  Oct 23, 1980 Patient phone:  [phone] (home)  Patient address:   [place] [address],  Total Time spent with patient: 45 minutes  Date of Admission:  11/07/2022 Date of Discharge: 11/20/2022  Reason for Admission:  Reason for admission: 42 year old AA male with hx of metal illness & probable hx of substance use disorders. He is known in this [facility] with complain of worsening symptoms of Schizoaffective disorder, bipolar type. Patient was a patient in this [facility] last in July of 2022. He is being admitted to [facility] this time around with complain of increased paranoia/delusional thinking. Patient apparently had called the cops seeking a protective custody as he felt there were some people after him. After the cops picked him-up, he was taken to the [facility] for evaluation & was later transferred to the [facility] for further psychiatric evaluation/treatments.   Principal Problem: Schizoaffective disorder, bipolar type Discharge Diagnoses: Principal Problem:   Schizoaffective disorder, bipolar type  Past Psychiatric History: See H & P  Past Medical History:  Past Medical History:  Diagnosis Date   Anxiety    Depression    Schizoaffective disorder ([place])    Stroke ([place])    History reviewed. No pertinent surgical history. Family History:  Family History  Problem Relation Age of Onset   Heart disease Mother    Hyperlipidemia Mother    Mental illness Father    Family Psychiatric  History: See H & P Social History:  Social History   Substance and Sexual Activity  Alcohol Use Yes   Alcohol/week: 1.0 standard drink of alcohol   Types: 1 Glasses of wine per week     Social History   Substance and Sexual Activity  Drug Use No    Social History   Socioeconomic History   Marital status: Single    Spouse name: Not on file   Number of children: Not on file   Years of education: Not on  file   Highest education level: Not on file  Occupational History   Not on file  Tobacco Use   Smoking status: Some Days    Packs/day: 2    Types: Cigarettes   Smokeless tobacco: Never  Vaping Use   Vaping Use: Never used  Substance and Sexual Activity   Alcohol use: Yes    Alcohol/week: 1.0 standard drink of alcohol    Types: 1 Glasses of wine per week   Drug use: No   Sexual activity: Never  Other Topics Concern   Not on file  Social History Narrative   Not on file   Social Determinants of Health   Financial Resource Strain: Not on file  Food Insecurity: No Food Insecurity (11/07/2022)   Hunger Vital Sign    Worried About Running Out of Food in the Last Year: Never true    Ran Out of Food in the Last Year: Never true  Transportation Needs: No Transportation Needs (11/07/2022)   PRAPARE - Hydrologist (Medical): No    Lack of Transportation (Non-Medical): No  Physical Activity: Not on file  Stress: Not on file  Social Connections: Not on file   HOSPITAL COURSE: During the patient's hospitalization, patient had extensive initial psychiatric evaluation, and follow-up psychiatric evaluations every day. Psychiatric diagnoses provided upon initial assessment are as listed above. Patient's Medications were adjusted on admission as follows: -Initiated Haldol 5  mg po bid for mood control.  -Benztropine 1 mg po bid for eps prevention.  -Continue hydroxyzine 25 mg po tid prn for anxiety. -Trazodone 50 mg po Q hs prn for insomnia.   -Benadryl 50 mg po or IM tid prn. -Haldol 5 mg po or IM tid prn.  -Lorazepam 2 mg po or IM tid prn. -Initiated Lisinopril 5 mg po daily for HTN.  -Continue Tylenol 650 mg every 6 hours PRN for mild pain -Continue Maalox 30 ml Q 4 hrs PRN for indigestion -Continue MOM 30 ml po Q 6 hrs for constipation   During the hospitalization, other adjustments were made to the patient's psychiatric medication regimen. Medications  recommended to be continued at discharge are as follows:   -Continue Haldol 10 mg po bid for mood control/psychosis x 3 weeks, then stop, and continue with Haldol Dec monthly injection -Continue Benztropine 1 mg po bid for eps prevention.  -Continue hydroxyzine 25 mg po tid prn for anxiety. -Continue Trazodone 50 mg po Q hs prn for insomnia. -Continue Nicotine patch for nicotine dependence  -Continue Depakote ER 500 mg p.o. daily for mood stabilization-VPA level drawn on 11/20/2022, and slightly subtherapeutic at 46. Outpatient provider to f/u with having subsequent levels drawn -Continue Haldol decanoate 100 mg IM Q 30 days for mood stabilization/psychosis (1st dose given  11-18-22, next due on 12/19/22).   Patient's care was discussed during the interdisciplinary team meeting every day during the hospitalization. The patient denies  having side effects to prescribed psychiatric medication. Gradually, patient started adjusting to milieu. The patient was evaluated each day by a clinical provider to ascertain response to treatment. Improvement was noted by the patient's report of decreasing symptoms, improved sleep and appetite, affect, medication tolerance, behavior, and participation in unit programming.  Patient was asked each day to complete a self inventory noting mood, mental status, pain, new symptoms, anxiety and concerns. Symptoms were reported as significantly decreased or resolved completely by discharge.    On day of discharge, the patient reports that their mood is stable. The patient denied having suicidal thoughts for more than 48 hours prior to discharge.  Patient denies having homicidal thoughts.  Patient denies having auditory hallucinations.  Patient denies any visual hallucinations or other symptoms of psychosis. The patient was motivated to continue taking medication with a goal of continued improvement in mental health.    The patient reports their target psychiatric symptoms of  depression, insomnia, anxiety & psychosis responded well to the psychiatric medications, and the patient reports overall benefit from this psychiatric hospitalization. Supportive psychotherapy was provided to the patient. The patient also participated in regular group therapy while hospitalized. Coping skills, problem solving as well as relaxation therapies were also part of the unit programming.   Labs were reviewed with the patient, and abnormal results were discussed with the patient.   The patient is able to verbalize their individual safety plan to this provider.   # It is recommended to the patient to continue psychiatric medications as prescribed, after discharge from the hospital.     # It is recommended to the patient to follow up with your outpatient psychiatric provider and PCP.   # It was discussed with the patient, the impact of alcohol, drugs, tobacco have been there overall psychiatric and medical wellbeing, and total abstinence from substance use was recommended the patient.ed.   # Prescriptions provided or sent directly to preferred pharmacy at discharge. Patient agreeable to plan. Given opportunity to ask questions.  Appears to feel comfortable with discharge.    # In the event of worsening symptoms, the patient is instructed to call the crisis hotline (988), 911 and or go to the nearest ED for appropriate evaluation and treatment of symptoms. To follow-up with primary care provider for other medical issues, concerns and or health care needs   # Patient was discharged home with a plan to follow up as noted below. Writer spoke with pt's mother who states that she will be bringing a new key to pt's apartment today and picking pt up from this hospital at discharge.  Physical Findings: AIMS:0 CIWA: n/a   COWS:   n/a  Musculoskeletal: Strength & Muscle Tone: within normal limits Gait & Station: normal Patient leans: N/A  Psychiatric Specialty Exam:  Presentation  General  Appearance:  Appropriate for Environment; Fairly Groomed  Eye Contact: Good  Speech: Clear and Coherent  Speech Volume: Normal  Handedness: Right   Mood and Affect  Mood: Euthymic  Affect: Congruent   Thought Process  Thought Processes: Coherent  Descriptions of Associations:Intact  Orientation:Full (Time, Place and Person)  Thought Content:Logical  History of Schizophrenia/Schizoaffective disorder:Yes  Duration of Psychotic Symptoms:Greater than six months  Hallucinations:Hallucinations: None  Ideas of Reference:None  Suicidal Thoughts:Suicidal Thoughts: No  Homicidal Thoughts:Homicidal Thoughts: No   Sensorium  Memory: Immediate Good  Judgment: Fair  Insight: Fair   Community education officer  Concentration: Good  Attention Span: Good  Recall: Good  Fund of Knowledge: Good  Language: Good   Psychomotor Activity  Psychomotor Activity: Psychomotor Activity: Normal   Assets  Assets: Communication Skills; Social Support; Housing; Resilience   Sleep  Sleep: Sleep: Good    Physical Exam: Physical Exam Constitutional:      Appearance: Normal appearance.  HENT:     Head: Normocephalic.     Nose: Nose normal. No congestion or rhinorrhea.  Eyes:     Pupils: Pupils are equal, round, and reactive to light.  Pulmonary:     Effort: Pulmonary effort is normal.  Musculoskeletal:        General: Normal range of motion.     Cervical back: Normal range of motion.  Neurological:     Mental Status: He is alert and oriented to person, place, and time.    Review of Systems  Constitutional: Negative.   HENT: Negative.    Eyes: Negative.   Respiratory: Negative.    Cardiovascular: Negative.   Gastrointestinal:  Negative for heartburn.  Genitourinary: Negative.  Negative for dysuria.  Musculoskeletal: Negative.   Neurological: Negative.   Psychiatric/Behavioral:  Positive for depression (Denies SI/HI/AVH and verbally contracts  for safety outside of this [facility]). Negative for hallucinations, memory loss, substance abuse and suicidal ideas. The patient is nervous/anxious (resolving) and has insomnia (resolving).    Blood pressure (!) 136/91, pulse 87, temperature 97.9 F (36.6 C), temperature source Oral, resp. rate 20, height 5\' 9"  (1.753 m), weight 98.9 kg, SpO2 100 %. Body mass index is 32.19 kg/m.   Social History   Tobacco Use  Smoking Status Some Days   Packs/day: 2   Types: Cigarettes  Smokeless Tobacco Never   Tobacco Cessation:  A prescription for an FDA-approved tobacco cessation medication provided at discharge   Blood Alcohol level:  Lab Results  Component Value Date   [facility] <10 03/14/2021   ETH <5 [id number]    Metabolic Disorder Labs:  Lab Results  Component Value Date   HGBA1C 5.5 11/07/2022   MPG 111 11/07/2022  MPG 111 03/17/2021   Lab Results  Component Value Date   PROLACTIN 33.3 (H) 03/17/2021   PROLACTIN 26.5 (H) 01/20/2015   Lab Results  Component Value Date   CHOL 210 (H) 11/07/2022   TRIG 144 11/07/2022   HDL 51 11/07/2022   CHOLHDL 4.1 11/07/2022   VLDL 29 11/07/2022   LDLCALC 130 (H) 11/07/2022   LDLCALC 102 (H) 03/17/2021    See Psychiatric Specialty Exam and Suicide Risk Assessment completed by Attending Physician prior to discharge.  Discharge destination:  Home  Is patient on multiple antipsychotic therapies at discharge:  No   Has Patient had three or more failed trials of antipsychotic monotherapy by history:  No  Recommended Plan for Multiple Antipsychotic Therapies: NA   Allergies as of 11/20/2022   No Known Allergies      Medication List     STOP taking these medications    acetaminophen 325 MG tablet Commonly known as: TYLENOL   alum & mag hydroxide-simeth 200-200-20 MG/5ML suspension Commonly known as: MAALOX/MYLANTA   magnesium hydroxide 400 MG/5ML suspension Commonly known as: MILK OF MAGNESIA   OVER THE COUNTER MEDICATION        TAKE these medications      Indication  benztropine 1 MG tablet Commonly known as: COGENTIN Take 1 tablet (1 mg total) by mouth 2 (two) times daily. What changed:  medication strength how much to take  Indication: Extrapyramidal Reaction caused by Medications   cloNIDine 0.1 MG tablet Commonly known as: CATAPRES Take 1 tablet (0.1 mg total) by mouth daily. Start taking on: November 21, 2022  Indication: High Blood Pressure Disorder   divalproex 500 MG 24 hr tablet Commonly known as: DEPAKOTE ER Take 1 tablet (500 mg total) by mouth daily. Start taking on: November 21, 2022 What changed: when to take this  Indication: mood stabilization   haloperidol 10 MG tablet Commonly known as: HALDOL Take 1 tablet (10 mg total) by mouth 2 (two) times daily. What changed: when to take this  Indication: Mood control   haloperidol decanoate 100 MG/ML injection Commonly known as: HALDOL DECANOATE Inject 1 mL (100 mg total) into the muscle every 30 (thirty) days. Start taking on: December 18, 2022  Indication: MIXED BIPOLAR AFFECTIVE DISORDER   hydrOXYzine 25 MG tablet Commonly known as: ATARAX Take 1 tablet (25 mg total) by mouth 3 (three) times daily as needed for anxiety.  Indication: Feeling Anxious   lisinopril 10 MG tablet Commonly known as: ZESTRIL Take 1 tablet (10 mg total) by mouth daily. Start taking on: November 21, 2022  Indication: High Blood Pressure Disorder   nicotine 14 mg/24hr patch Commonly known as: NICODERM CQ - dosed in mg/24 hours Place 1 patch (14 mg total) onto the skin daily. Start taking on: November 21, 2022  Indication: Nicotine Addiction   traZODone 50 MG tablet Commonly known as: DESYREL Take 1 tablet (50 mg total) by mouth at bedtime as needed for sleep.  Indication: [place]. Go on 12/05/2022.   Specialty: Urgent Care Why: You have a scheduled medication management  appointment with this provider on 12/05/2022 at 10:00 AM, please speak with them on this day or sooner to get a scheduled therpay appointment. Contact information: [place]. Call on 11/21/2022.   Why: Please reach out  to this provider on 11/21/2022 to follow up with the necessary paperwork to start ACT services. This provider was unable to come in to visit you during your stay but has your referral paperwork. Contact information: [place] [address] [phone]                Signed: Nicholes Rough, NP 11/20/2022, 2:16 PM

## 2022-11-20 NOTE — Progress Notes (Signed)
   11/19/22 2030  Psych Admission Type (Psych Patients Only)  Admission Status Voluntary  Psychosocial Assessment  Patient Complaints Anxiety;Depression  Eye Contact Fair  Facial Expression Anxious  Affect Anxious  Speech Logical/coherent  Interaction Assertive  Motor Activity Fidgety  Appearance/Hygiene Disheveled  Behavior Characteristics Cooperative;Appropriate to situation  Mood Pleasant  Thought Process  Coherency Circumstantial  Content WDL  Delusions None reported or observed  Perception WDL  Hallucination None reported or observed  Judgment Poor  Confusion None  Danger to Self  Current suicidal ideation? Denies  Agreement Not to Harm Self Yes  Description of Agreement Verbal  Danger to Others  Danger to Others None reported or observed

## 2022-11-20 NOTE — [facility] Suicide Risk Assessment (Signed)
Suicide Risk Assessment  Discharge Assessment    [facility] Discharge Suicide Risk Assessment   Principal Problem: Schizoaffective disorder, bipolar type Discharge Diagnoses: Principal Problem:   Schizoaffective disorder, bipolar type  Reason for admission: 42 year old AA male with hx of metal illness & probable hx of substance use disorders. He is known in this [facility] with complain of worsening symptoms of Schizoaffective disorder, bipolar type. Patient was a patient in this [facility] last in July of 2022. He is being admitted to [facility] this time around with complain of increased paranoia/delusional thinking. Patient apparently had called the cops seeking a protective custody as he felt there were some people after him. After the cops picked him-up, he was taken to the [facility] for evaluation & was later transferred to the [facility] for further psychiatric evaluation/treatments.   HOSPITAL COURSE: During the patient's hospitalization, patient had extensive initial psychiatric evaluation, and follow-up psychiatric evaluations every day. Psychiatric diagnoses provided upon initial assessment are as listed above. Patient's Medications were adjusted on admission as follows: -Initiated Haldol 5 mg po bid for mood control.  -Benztropine 1 mg po bid for eps prevention.  -Continue hydroxyzine 25 mg po tid prn for anxiety. -Trazodone 50 mg po Q hs prn for insomnia.   -Benadryl 50 mg po or IM tid prn. -Haldol 5 mg po or IM tid prn.  -Lorazepam 2 mg po or IM tid prn. -Initiated Lisinopril 5 mg po daily for HTN.  -Continue Tylenol 650 mg every 6 hours PRN for mild pain -Continue Maalox 30 ml Q 4 hrs PRN for indigestion -Continue MOM 30 ml po Q 6 hrs for constipation  During the hospitalization, other adjustments were made to the patient's psychiatric medication regimen. Medications recommended to be continued at discharge are as follows:  -Continue Haldol 10 mg po bid for mood control/psychosis x 3 weeks, then stop, and  continue with Haldol Dec monthly injection -Continue Benztropine 1 mg po bid for eps prevention.  -Continue hydroxyzine 25 mg po tid prn for anxiety. -Continue Trazodone 50 mg po Q hs prn for insomnia.  -Continue Depakote ER 500 mg p.o. daily for mood stabilization-VPA level drawn on 11/20/2022, and slightly subtherapeutic at 46. Outpatient provider to f/u with having subsequent levels drawn -Continue Haldol decanoate 100 mg IM Q 30 days for mood stabilization/psychosis (1st dose given  11-18-22, next due on 12/19/22).  Patient's care was discussed during the interdisciplinary team meeting every day during the hospitalization. The patient denies  having side effects to prescribed psychiatric medication. Gradually, patient started adjusting to milieu. The patient was evaluated each day by a clinical provider to ascertain response to treatment. Improvement was noted by the patient's report of decreasing symptoms, improved sleep and appetite, affect, medication tolerance, behavior, and participation in unit programming.  Patient was asked each day to complete a self inventory noting mood, mental status, pain, new symptoms, anxiety and concerns. Symptoms were reported as significantly decreased or resolved completely by discharge.   On day of discharge, the patient reports that their mood is stable. The patient denied having suicidal thoughts for more than 48 hours prior to discharge.  Patient denies having homicidal thoughts.  Patient denies having auditory hallucinations.  Patient denies any visual hallucinations or other symptoms of psychosis. The patient was motivated to continue taking medication with a goal of continued improvement in mental health.   The patient reports their target psychiatric symptoms of depression, insomnia, anxiety & psychosis responded well to the psychiatric medications, and the  patient reports overall benefit from this psychiatric hospitalization. Supportive psychotherapy was  provided to the patient. The patient also participated in regular group therapy while hospitalized. Coping skills, problem solving as well as relaxation therapies were also part of the unit programming.  Labs were reviewed with the patient, and abnormal results were discussed with the patient.  The patient is able to verbalize their individual safety plan to this provider.  # It is recommended to the patient to continue psychiatric medications as prescribed, after discharge from the hospital.    # It is recommended to the patient to follow up with your outpatient psychiatric provider and PCP.  # It was discussed with the patient, the impact of alcohol, drugs, tobacco have been there overall psychiatric and medical wellbeing, and total abstinence from substance use was recommended the patient.ed.  # Prescriptions provided or sent directly to preferred pharmacy at discharge. Patient agreeable to plan. Given opportunity to ask questions. Appears to feel comfortable with discharge.    # In the event of worsening symptoms, the patient is instructed to call the crisis hotline (988), 911 and or go to the nearest ED for appropriate evaluation and treatment of symptoms. To follow-up with primary care provider for other medical issues, concerns and or health care needs  # Patient was discharged home with a plan to follow up as noted below. Writer spoke with pt's mother who states that she will be bringing a new key to pt's apartment today and picking pt up from this hospital at discharge.  Total Time spent with patient: 45 minutes  Musculoskeletal: Strength & Muscle Tone: within normal limits Gait & Station: normal Patient leans: N/A  Psychiatric Specialty Exam  Presentation  General Appearance:  Appropriate for Environment; Fairly Groomed  Eye Contact: Good  Speech: Clear and Coherent  Speech Volume: Normal  Handedness: Right   Mood and Affect  Mood: Euthymic  Duration of  Depression Symptoms: Less than two weeks  Affect: Congruent   Thought Process  Thought Processes: Coherent  Descriptions of Associations:Intact  Orientation:Full (Time, Place and Person)  Thought Content:Logical  History of Schizophrenia/Schizoaffective disorder:Yes  Duration of Psychotic Symptoms:Greater than six months  Hallucinations:Hallucinations: None  Ideas of Reference:None  Suicidal Thoughts:Suicidal Thoughts: No  Homicidal Thoughts:Homicidal Thoughts: No   Sensorium  Memory: Immediate Good  Judgment: Fair  Insight: Fair   Community education officer  Concentration: Good  Attention Span: Good  Recall: Good  Fund of Knowledge: Good  Language: Good   Psychomotor Activity  Psychomotor Activity: Psychomotor Activity: Normal   Assets  Assets: Communication Skills; Social Support; Housing; Resilience   Sleep  Sleep: Sleep: Good   Physical Exam: Physical Exam Constitutional:      Appearance: Normal appearance.  HENT:     Head: Normocephalic.     Nose: Nose normal.  Eyes:     Pupils: Pupils are equal, round, and reactive to light.  Musculoskeletal:        General: Normal range of motion.     Cervical back: Normal range of motion.  Neurological:     Mental Status: He is alert and oriented to person, place, and time.  Psychiatric:        Behavior: Behavior normal.        Thought Content: Thought content normal.    Review of Systems  Constitutional: Negative.   HENT: Negative.    Eyes: Negative.   Respiratory: Negative.    Cardiovascular: Negative.   Gastrointestinal:  Negative for heartburn.  Genitourinary:  Negative.   Musculoskeletal: Negative.   Skin: Negative.   Neurological:  Negative for dizziness.  Psychiatric/Behavioral:  Positive for depression (Denies SI/HI/AVH, verbally contracts for safety outside of [facility]). Negative for hallucinations, memory loss, substance abuse and suicidal ideas. The patient is  nervous/anxious (resolving) and has insomnia (resolving).    Blood pressure (!) 136/91, pulse 87, temperature 97.9 F (36.6 C), temperature source Oral, resp. rate 20, height 5\' 9"  (1.753 m), weight 98.9 kg, SpO2 100 %. Body mass index is 32.19 kg/m.  Mental Status Per Nursing Assessment::   On Admission:  NA  Demographic Factors:  Male  Loss Factors: Financial problems/change in socioeconomic status  Historical Factors: Family history of mental illness or substance abuse and NA  Risk Reduction Factors:   Positive social support  Continued Clinical Symptoms:  Schizophrenia:   Paranoid or undifferentiated type-Schizoaffective d/o-Currently denies SI/HI/AVH, denies paranoia, states that he is willing to go back to the apartment complex that he was residing at prior to admission. Verbally contracts for safety outside of this [facility].  Cognitive Features That Contribute To Risk:  None    Suicide Risk:  Mild:  There are no identifiable suicide plans, no associated intent, mild dysphoria and related symptoms, good self-control (both objective and subjective assessment), few other risk factors, and identifiable protective factors, including available and accessible social support.    [place]. Go on 12/05/2022.   Specialty: Urgent Care Why: You have a scheduled medication management appointment with this provider on 12/05/2022 at 10:00 AM, please speak with them on this day or sooner to get a scheduled therpay appointment. Contact information: [place]. Call on 11/21/2022.   Why: Please reach out to this provider on 11/21/2022 to follow up with the necessary paperwork to start ACT services. This provider was unable to come in to visit you during your stay but has your referral paperwork. Contact information: [place]  [address] [phone]                Nicholes Rough, NP 11/20/2022, 2:08 PM

## 2022-11-20 NOTE — Progress Notes (Addendum)
  Delight Ovens, Chaplain  Othelia Pulling, Chaplain Spiritual care group on grief and loss facilitated by [employer], [employer] and Chaplain Vani Gunner   Group Goal: Support / Education around grief and loss   Members engage in facilitated group support and psycho-social education.   Group Description:   Following introductions and group rules, group members engaged in facilitated group dialogue and support around topic of loss, with particular support around experiences of loss in their lives. Group Identified types of loss (relationships / self / things) and identified patterns, circumstances, and changes that precipitate losses. Reflected on thoughts / feelings around loss, normalized grief responses, and recognized variety in grief experience. Group encouraged individual reflection on safe space and on the coping skills that they are already utilizing.   Group drew on Adlerian / Rogerian and narrative framework   Patient Progress: Patient attended group but did not participate. However patient did engage in laughter with peers.      Rica Heather [employer]

## 2022-11-21 ENCOUNTER — Other Ambulatory Visit: Payer: Self-pay

## 2022-11-24 ENCOUNTER — Other Ambulatory Visit: Payer: Self-pay

## 2022-12-05 ENCOUNTER — Ambulatory Visit (INDEPENDENT_AMBULATORY_CARE_PROVIDER_SITE_OTHER): Payer: 59 | Admitting: Physician Assistant

## 2022-12-05 VITALS — BP 125/92 | HR 85 | Ht 69.0 in | Wt 232.2 lb

## 2022-12-05 DIAGNOSIS — F250 Schizoaffective disorder, bipolar type: Secondary | ICD-10-CM

## 2022-12-05 DIAGNOSIS — F411 Generalized anxiety disorder: Secondary | ICD-10-CM | POA: Insufficient documentation

## 2022-12-05 DIAGNOSIS — I10 Essential (primary) hypertension: Secondary | ICD-10-CM

## 2022-12-05 DIAGNOSIS — F5105 Insomnia due to other mental disorder: Secondary | ICD-10-CM | POA: Diagnosis not present

## 2022-12-05 DIAGNOSIS — F99 Mental disorder, not otherwise specified: Secondary | ICD-10-CM | POA: Insufficient documentation

## 2022-12-05 MED ORDER — LISINOPRIL 10 MG PO TABS: 10.0000 mg | ORAL_TABLET | Freq: Every day | ORAL | 0 refills | Status: DC

## 2022-12-05 MED ORDER — BENZTROPINE MESYLATE 1 MG PO TABS: 1.0000 mg | ORAL_TABLET | Freq: Two times a day (BID) | ORAL | 1 refills | Status: DC

## 2022-12-05 MED ORDER — HALOPERIDOL DECANOATE 100 MG/ML IM SOLN: 100.0000 mg | INTRAMUSCULAR | 11 refills | Status: DC

## 2022-12-05 MED ORDER — DIVALPROEX SODIUM ER 500 MG PO TB24: 500.0000 mg | ORAL_TABLET | Freq: Every day | ORAL | 1 refills | Status: DC

## 2022-12-05 MED ORDER — TRAZODONE HCL 50 MG PO TABS: 50.0000 mg | ORAL_TABLET | Freq: Every evening | ORAL | 1 refills | Status: DC | PRN

## 2022-12-05 MED ORDER — HALOPERIDOL 10 MG PO TABS: 10.0000 mg | ORAL_TABLET | Freq: Two times a day (BID) | ORAL | 1 refills | Status: DC

## 2022-12-05 MED ORDER — HYDROXYZINE HCL 25 MG PO TABS: 25.0000 mg | ORAL_TABLET | Freq: Three times a day (TID) | ORAL | 1 refills | Status: DC | PRN

## 2022-12-05 MED ORDER — CLONIDINE HCL 0.1 MG PO TABS: 0.1000 mg | ORAL_TABLET | Freq: Every day | ORAL | 0 refills | Status: DC
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
## 2022-12-06 ENCOUNTER — Encounter (HOSPITAL_COMMUNITY): Payer: Self-pay | Admitting: Physician Assistant

## 2022-12-11 ENCOUNTER — Other Ambulatory Visit: Payer: Self-pay

## 2022-12-17 ENCOUNTER — Other Ambulatory Visit (HOSPITAL_COMMUNITY): Payer: Self-pay | Admitting: Physician Assistant

## 2022-12-17 DIAGNOSIS — I10 Essential (primary) hypertension: Secondary | ICD-10-CM

## 2022-12-20 ENCOUNTER — Telehealth (HOSPITAL_COMMUNITY): Payer: Self-pay

## 2022-12-26 NOTE — Telephone Encounter (Signed)
Message acknowledged and reviewed. Additional refill not appropriate due to medication being non-psychiatric. Patient was encouraged to establish care with a psychiatric provider.

## 2022-12-27 ENCOUNTER — Ambulatory Visit (INDEPENDENT_AMBULATORY_CARE_PROVIDER_SITE_OTHER): Payer: 59 | Admitting: Student in an Organized Health Care Education/Training Program

## 2022-12-27 ENCOUNTER — Encounter (HOSPITAL_COMMUNITY): Payer: Self-pay

## 2022-12-27 ENCOUNTER — Ambulatory Visit (INDEPENDENT_AMBULATORY_CARE_PROVIDER_SITE_OTHER): Payer: 59 | Admitting: *Deleted

## 2022-12-27 VITALS — BP 149/113 | HR 88 | Ht 69.0 in | Wt 235.0 lb

## 2022-12-27 VITALS — BP 149/113 | HR 88 | Resp 16 | Ht 69.0 in | Wt 235.4 lb

## 2022-12-27 DIAGNOSIS — I10 Essential (primary) hypertension: Secondary | ICD-10-CM | POA: Diagnosis not present

## 2022-12-27 DIAGNOSIS — F99 Mental disorder, not otherwise specified: Secondary | ICD-10-CM

## 2022-12-27 DIAGNOSIS — F411 Generalized anxiety disorder: Secondary | ICD-10-CM | POA: Diagnosis not present

## 2022-12-27 DIAGNOSIS — F250 Schizoaffective disorder, bipolar type: Secondary | ICD-10-CM | POA: Diagnosis not present

## 2022-12-27 DIAGNOSIS — F5105 Insomnia due to other mental disorder: Secondary | ICD-10-CM

## 2022-12-27 MED ORDER — LISINOPRIL 10 MG PO TABS: 10.0000 mg | ORAL_TABLET | Freq: Every day | ORAL | 0 refills | Status: DC

## 2022-12-27 MED ORDER — CLONIDINE HCL 0.1 MG PO TABS: 0.1000 mg | ORAL_TABLET | Freq: Every day | ORAL | 0 refills | Status: DC

## 2022-12-27 MED ORDER — HALOPERIDOL DECANOATE 100 MG/ML IM SOLN
100.0000 mg | Freq: Once | INTRAMUSCULAR | Status: AC
  Administered 2022-12-27: 100 mg via INTRAMUSCULAR

## 2022-12-27 NOTE — Progress Notes (Signed)
Patient arrived with his Haldol Decanoate 100mg , given in Left Deltoid without issues or complaints. Did have elevated BP and states he is out of his Blood Pressure medication. Psychiatrist spoke with patient and was able to give refills. No other concerns.

## 2022-12-27 NOTE — Progress Notes (Signed)
BH MD/PA/NP OP Progress Note  12/27/2022 2:39 PM Steven Clayton  MRN:  [id number]  Chief Complaint:  Chief Complaint  Patient presents with   LAI   Hypertension   HPI:  Steven Mcmanigle. is a 42 year old, African-American male with a past psychiatric history significant for schizoaffective disorder (bipolar type), anxiety, and insomnia who presents to [facility] for [facility] clinic   He reports that he is doing well.  He reports no side effects to his medications.  He reports his medications are adequately controlling his symptoms.  Discussed with him that his blood pressure is significantly elevated today.  He reports that this is due to him running out of his blood pressure medicine-lisinopril.  He reports that he has not yet established with a PCP.  When discussing the importance of this with him he reports that he is aware of this and that he plans to make an appointment with [facility].  He reports that in the next week or 2 he plans to call to make an appointment.  Discussed with him that a refill of his lisinopril and clonidine will be sent in.  Discussed with him the importance of using caution the first few days after restarting the medicine.  Discussed importance of allowing himself extra time when standing up so does not become hypotensive and potentially passed out he reported understanding.  He reports no SI, HI, or VH.  When asked about AH he does report that he occasionally hears some.  He reports some mild dizziness/weakness the last day or so but denies chest pain, shortness of breath, jaw pain, or left arm pain.  He reports no other concerns at present.  Visit Diagnosis:    ICD-10-CM   1. Schizoaffective disorder, bipolar type (HCC)  F25.0     2. Essential hypertension  I10 lisinopril (ZESTRIL) 10 MG tablet    cloNIDine (CATAPRES) 0.1 MG tablet    3. Insomnia due to other mental disorder  F51.05    F99     4.  Anxiety state  F41.1       Past Psychiatric History: Schizoaffective disorder (bipolar type), anxiety, and insomnia   Patient has a past history of hospitalization due to mental health and was recently hospitalized on 11/07/2022.  Past Medical History:  Past Medical History:  Diagnosis Date   Anxiety    Depression    Schizoaffective disorder (HCC)    Stroke (HCC)    No past surgical history on file.  Family Psychiatric History: Mother - major depressive disorder   Family History:  Family History  Problem Relation Age of Onset   Heart disease Mother    Hyperlipidemia Mother    Mental illness Father     Social History:  Social History   Socioeconomic History   Marital status: Single    Spouse name: Not on file   Number of children: Not on file   Years of education: Not on file   Highest education level: Not on file  Occupational History   Not on file  Tobacco Use   Smoking status: Some Days    Packs/day: 2    Types: Cigarettes   Smokeless tobacco: Never  Vaping Use   Vaping Use: Never used  Substance and Sexual Activity   Alcohol use: Yes    Alcohol/week: 1.0 standard drink of alcohol    Types: 1 Glasses of wine per week   Drug use: No   Sexual activity:  Never  Other Topics Concern   Not on file  Social History Narrative   Not on file   Social Determinants of Health   Financial Resource Strain: Not on file  Food Insecurity: No Food Insecurity (11/07/2022)   Hunger Vital Sign    Worried About Running Out of Food in the Last Year: Never true    Ran Out of Food in the Last Year: Never true  Transportation Needs: No Transportation Needs (11/07/2022)   PRAPARE - Administrator, Civil Service (Medical): No    Lack of Transportation (Non-Medical): No  Physical Activity: Not on file  Stress: Not on file  Social Connections: Not on file    Allergies: No Known Allergies  Metabolic Disorder Labs: Lab Results  Component Value Date   HGBA1C 5.5  11/07/2022   MPG 111 11/07/2022   MPG 111 03/17/2021   Lab Results  Component Value Date   PROLACTIN 33.3 (H) 03/17/2021   PROLACTIN 26.5 (H) 01/20/2015   Lab Results  Component Value Date   CHOL 210 (H) 11/07/2022   TRIG 144 11/07/2022   HDL 51 11/07/2022   CHOLHDL 4.1 11/07/2022   VLDL 29 11/07/2022   LDLCALC 130 (H) 11/07/2022   LDLCALC 102 (H) 03/17/2021   Lab Results  Component Value Date   TSH 1.796 11/07/2022   TSH 1.898 03/17/2021    Therapeutic Level Labs: No results found for: "LITHIUM" Lab Results  Component Value Date   VALPROATE 46 (L) 11/20/2022   VALPROATE <10 (L) 11/07/2022   No results found for: "CBMZ"  Current Medications: Current Outpatient Medications  Medication Sig Dispense Refill   benztropine (COGENTIN) 1 MG tablet Take 1 tablet (1 mg total) by mouth 2 (two) times daily. 60 tablet 1   cloNIDine (CATAPRES) 0.1 MG tablet Take 1 tablet (0.1 mg total) by mouth daily. 30 tablet 0   divalproex (DEPAKOTE ER) 500 MG 24 hr tablet Take 1 tablet (500 mg total) by mouth daily. 30 tablet 1   haloperidol (HALDOL) 10 MG tablet Take 1 tablet (10 mg total) by mouth 2 (two) times daily. 60 tablet 1   haloperidol decanoate (HALDOL DECANOATE) 100 MG/ML injection Inject 1 mL (100 mg total) into the muscle every 30 (thirty) days. 1 mL 11   hydrOXYzine (ATARAX) 25 MG tablet Take 1 tablet (25 mg total) by mouth 3 (three) times daily as needed for anxiety. 75 tablet 1   lisinopril (ZESTRIL) 10 MG tablet Take 1 tablet (10 mg total) by mouth daily. 30 tablet 0   nicotine (NICODERM CQ - DOSED IN MG/24 HOURS) 14 mg/24hr patch Place 1 patch (14 mg total) onto the skin daily. 28 patch 0   traZODone (DESYREL) 50 MG tablet Take 1 tablet (50 mg total) by mouth at bedtime as needed for sleep. 30 tablet 1   No current facility-administered medications for this visit.     Musculoskeletal: Strength & Muscle Tone: within normal limits Gait & Station: normal Patient leans:  N/A  Psychiatric Specialty Exam: Review of Systems  Respiratory:  Negative for cough and shortness of breath.   Cardiovascular:  Negative for chest pain.  Gastrointestinal:  Negative for abdominal pain, constipation, diarrhea, nausea and vomiting.  Neurological:  Positive for dizziness (mild) and weakness (mild). Negative for headaches.  Psychiatric/Behavioral:  Positive for hallucinations. Negative for dysphoric mood, sleep disturbance and suicidal ideas. The patient is not nervous/anxious.     Blood pressure (!) 149/113, pulse 88, height 5\' 9"  (1.753  m), weight 235 lb (106.6 kg), SpO2 100 %.Body mass index is 34.7 kg/m.  General Appearance: Casual and Fairly Groomed  Eye Contact:  Good  Speech:  Clear and Coherent and rapid but not pressured  Volume:  Normal  Mood:  Euthymic  Affect:  Congruent  Thought Process:  Coherent but occasionally disorganized  Orientation:  Full (Time, Place, and Person)  Thought Content: Tangential and reports some AH  Suicidal Thoughts:  No  Homicidal Thoughts:  No  Memory:  Immediate;   Fair  Judgement:  Intact  Insight:  Present  Psychomotor Activity:  Normal  Concentration:  Concentration: Fair and Attention Span: Fair  Recall:  [employer] of Knowledge: Fair  Language: Fair  Akathisia:  Negative  Handed:  Right  AIMS (if indicated): not done  Assets:  Communication Skills Desire for Improvement Resilience Social Support  ADL's:  Intact  Cognition: WNL  Sleep:  Good   Screenings: AIMS    Flowsheet Row [facility]  AIMS Total Score 0 0      AUDIT    Flowsheet Row [facility]) [facility] Ad[facility] ([facility]  Alcohol Use Disorder Identification Test  Final Score (AUDIT) 0 2      GAD-7    Flowsheet Row Office Visit from 12/05/2022 in [facility] Video Visit from 04/07/2022 in [facility] Office Visit from 12/02/2021 in [facility] Office Visit from 10/07/2021 in [facility] Office Visit from 07/29/2021 in [facility]  Total GAD-7 Score 13 14 13 12 9       PHQ2-9    Flowsheet Row Office Visit from 12/05/2022 in [facility] Video Visit from 04/07/2022 in [facility] Office Visit from 12/02/2021 in [facility] Office Visit from 10/07/2021 in [facility] Office Visit from 07/29/2021 in [facility]  PHQ-2 Total Score 2 2 2 1 2   PHQ-9 Total Score 13 12 13  -- 14      Flowsheet Row Office Visit from 12/05/2022 in [facility] Most recent reading at 12/05/2022 10:34 AM Ad[facility] (Discharged[facility] Most recent reading at 11/07/2022  6:00 PM ED from 11/07/2022 in [facility] Most recent reading at 11/07/2022  3:14 AM  C-SSRS RISK CATEGORY No Risk No Risk No Risk        Assessment and Plan:  Steven Huesman. is a 42 year old, African-American male with a past psychiatric history significant for schizoaffective disorder (bipolar type), anxiety, and insomnia who presents to [facility] for [facility] clinic.   Steven Clayton is tolerating his medications well without side effects.  His blood pressure is elevated today due to running out of Lisinopril that was started while on the inpatient unit.  We will send in a refill of his Lisinopril and Clonidine.  He reports he will make any appointment with [facility] to  establish with a PCP.  We will not make any other changes to his medication at this time.  He was given his Haldol Dec injection today.  He was somewhat tangential and is  reporting some AH today, however, it is not distressing if he does continue to have issues may consider increasing his Haldol Dec at next appointment.  He will return in 28 days for follow-up injection.   Essential Hypertension: -Restart Lisinopril 10 mg daily.  30 tablets with 0 refills. -Restart Clonidine 0.1 mg daily.  30 tablets with 0 refills.   Schizoaffective disorder Bipolar type  Insomnia due to other mental disorder  -Continue Benztropine 1 mg 2 times daily.  No refills sent at this time. -Continue Divalproex 500 mg 24-hour tablet daily.  No refills sent at this time. -Continue Haloperidol 10 mg 2 times daily.  No refills sent at this time. -Continue Haloperidol decanoate 100 mg/mL injection (given 12/27/2022) --Continue Hydroxyzine 25 mg 3 times daily as needed -Continue Trazodone 50 mg at bedtime    Collaboration of Care:   Patient/Guardian was advised Release of Information must be obtained prior to any record release in order to collaborate their care with an outside provider. Patient/Guardian was advised if they have not already done so to contact the registration department to sign all necessary forms in order for [place] to release information regarding their care.   Consent: Patient/Guardian gives verbal consent for treatment and assignment of benefits for services provided during this visit. Patient/Guardian expressed understanding and agreed to proceed.    Lauro Franklin, MD 12/27/2022, 2:39 PM

## 2023-01-09 ENCOUNTER — Other Ambulatory Visit (HOSPITAL_COMMUNITY): Payer: Self-pay | Admitting: Physician Assistant

## 2023-01-09 DIAGNOSIS — I10 Essential (primary) hypertension: Secondary | ICD-10-CM

## 2023-01-19 ENCOUNTER — Encounter (HOSPITAL_COMMUNITY): Payer: 59 | Admitting: Physician Assistant

## 2023-01-22 ENCOUNTER — Other Ambulatory Visit (HOSPITAL_COMMUNITY): Payer: Self-pay | Admitting: Physician Assistant

## 2023-01-22 DIAGNOSIS — F250 Schizoaffective disorder, bipolar type: Secondary | ICD-10-CM

## 2023-01-24 ENCOUNTER — Ambulatory Visit (INDEPENDENT_AMBULATORY_CARE_PROVIDER_SITE_OTHER): Payer: 59 | Admitting: *Deleted

## 2023-01-24 ENCOUNTER — Encounter (HOSPITAL_COMMUNITY): Payer: Self-pay

## 2023-01-24 VITALS — BP 124/90 | HR 98 | Ht 69.0 in | Wt 235.0 lb

## 2023-01-24 DIAGNOSIS — F250 Schizoaffective disorder, bipolar type: Secondary | ICD-10-CM | POA: Diagnosis not present

## 2023-01-24 MED ORDER — HALOPERIDOL DECANOATE 100 MG/ML IM SOLN
100.0000 mg | INTRAMUSCULAR | Status: AC
  Administered 2023-01-24 – 2023-03-28 (×2): 100 mg via INTRAMUSCULAR

## 2023-01-24 NOTE — Progress Notes (Signed)
In as scheduled for his monthly inj of Haldol D 100 mg, given today in his R DELTOID without difficulty. He smells of BO. Its a warm day and he walked to this appt. He states he is doing well except for having issues with some of his neighbors, hoping it will blow over. No issues with psychotic sx.He is pleasant and appropriate. He is to return in 28 days for his next inj.

## 2023-02-02 ENCOUNTER — Telehealth (INDEPENDENT_AMBULATORY_CARE_PROVIDER_SITE_OTHER): Payer: 59 | Admitting: Physician Assistant

## 2023-02-02 DIAGNOSIS — F5105 Insomnia due to other mental disorder: Secondary | ICD-10-CM | POA: Diagnosis not present

## 2023-02-02 DIAGNOSIS — F99 Mental disorder, not otherwise specified: Secondary | ICD-10-CM

## 2023-02-02 DIAGNOSIS — F411 Generalized anxiety disorder: Secondary | ICD-10-CM | POA: Diagnosis not present

## 2023-02-02 DIAGNOSIS — F250 Schizoaffective disorder, bipolar type: Secondary | ICD-10-CM

## 2023-02-05 ENCOUNTER — Other Ambulatory Visit (HOSPITAL_COMMUNITY): Payer: Self-pay | Admitting: Physician Assistant

## 2023-02-05 ENCOUNTER — Encounter (HOSPITAL_COMMUNITY): Payer: Self-pay | Admitting: Physician Assistant

## 2023-02-05 DIAGNOSIS — F250 Schizoaffective disorder, bipolar type: Secondary | ICD-10-CM

## 2023-02-05 MED ORDER — HYDROXYZINE HCL 25 MG PO TABS: 25.0000 mg | ORAL_TABLET | Freq: Three times a day (TID) | ORAL | 1 refills | Status: DC | PRN

## 2023-02-05 MED ORDER — DIVALPROEX SODIUM ER 500 MG PO TB24: 500.0000 mg | ORAL_TABLET | Freq: Every day | ORAL | 1 refills | Status: DC

## 2023-02-05 MED ORDER — HALOPERIDOL 10 MG PO TABS: 10.0000 mg | ORAL_TABLET | Freq: Two times a day (BID) | ORAL | 1 refills | Status: DC

## 2023-02-05 MED ORDER — TRAZODONE HCL 50 MG PO TABS: 50.0000 mg | ORAL_TABLET | Freq: Every evening | ORAL | 1 refills | Status: DC | PRN

## 2023-02-05 MED ORDER — BENZTROPINE MESYLATE 1 MG PO TABS: ORAL_TABLET | ORAL | 0 refills | Status: DC

## 2023-02-05 NOTE — Progress Notes (Signed)
BH MD/PA/NP OP Progress Note  Virtual Visit via Video Note  I connected with Steven Clayton on 02/05/23 at  3:30 PM EDT by a video enabled telemedicine application and verified that I am speaking with the correct person using two identifiers.  Location: Patient: Home Provider: Clinic   I discussed the limitations of evaluation and management by telemedicine and the availability of in person appointments. The patient expressed understanding and agreed to proceed.  Follow Up Instructions:   I discussed the assessment and treatment plan with the patient. The patient was provided an opportunity to ask questions and all were answered. The patient agreed with the plan and demonstrated an understanding of the instructions.   The patient was advised to call back or seek an in-person evaluation if the symptoms worsen or if the condition fails to improve as anticipated.  I provided 12 minutes of non-face-to-face time during this encounter.  Meta Hatchet, PA    02/05/2023 10:42 AM Steven Clayton  MRN:  [id number]  Chief Complaint:  Chief Complaint  Patient presents with   Follow-up   Medication Refill   HPI:   Steven Clayton. is a 42 year old, African-American male with a past psychiatric history significant for schizoaffective disorder (bipolar type), anxiety, and insomnia who presents to [facility] for follow-up and medication management. Patient was last seen by Mardelle Matte. Renaldo Fiddler, MD on 12/27/2022 at [facility]. Patient is currently being managed on the following psychiatric medications:  Benztropine 1 mg 2 times daily Divalproex 500 mg 24-hour tablet daily Haloperidol 10 mg 2 times daily Haloperidol decanoate 100 mg/mL injection (first dose given on 11/18/2022) Hydroxyzine 25 mg 3 times daily as needed Trazodone 50 mg at bedtime  Patient reports no issues or concerns regarding his current  medication regimen.  Patient denies experiencing any adverse side effects and further denies the need for dosage adjustments at this time.  Patient denies experiencing any depression nor does he report any anxiety at this time.  Patient denies any new stressors at this time.  A GAD-7 screen was performed with the patient scoring a 9.  Patient is alert and oriented x 4, calm, cooperative, and fully engaged in conversation during the encounter.  Patient endorses happy mood.  Patient denies suicidal or homicidal ideations.  He further denies auditory or visual hallucinations and does not appear to be responding to internal/external stimuli.  Patient endorses fair sleep and receives on average 5 to 6 hours of sleep per night.  Patient endorses fair appetite and eats on average 1-3 meals per day.  Patient denies any alcohol consumption or illicit drug use.  Patient endorses tobacco use and smokes on average 3 to 4 cigarettes/day.  Visit Diagnosis:    ICD-10-CM   1. Schizoaffective disorder, bipolar type (HCC)  F25.0 divalproex (DEPAKOTE ER) 500 MG 24 hr tablet    haloperidol (HALDOL) 10 MG tablet    benztropine (COGENTIN) 1 MG tablet    2. Insomnia due to other mental disorder  F51.05 traZODone (DESYREL) 50 MG tablet   F99     3. Anxiety state  F41.1 hydrOXYzine (ATARAX) 25 MG tablet      Past Psychiatric History:  Patient has a past psychiatric history significant for the following: Schizoaffective disorder (bipolar type), anxiety, and insomnia  Patient has a past history of hospitalization due to mental health and was recently hospitalized on 11/07/2022.   Past Medical History:  Past Medical History:  Diagnosis  Date   Anxiety    Depression    Schizoaffective disorder (HCC)    Stroke [facility])    History reviewed. No pertinent surgical history.  Family Psychiatric History:  Patient denies any specific family history of psychiatric  illness. He does report that his father suffered traumatic  brain injury while being in the [employer].   Mother - major depressive disorder  Family History:  Family History  Problem Relation Age of Onset   Heart disease Mother    Hyperlipidemia Mother    Mental illness Father     Social History:  Social History   Socioeconomic History   Marital status: Single    Spouse name: Not on file   Number of children: Not on file   Years of education: Not on file   Highest education level: Not on file  Occupational History   Not on file  Tobacco Use   Smoking status: Some Days    Packs/day: 2    Types: Cigarettes   Smokeless tobacco: Never  Vaping Use   Vaping Use: Never used  Substance and Sexual Activity   Alcohol use: Yes    Alcohol/week: 1.0 standard drink of alcohol    Types: 1 Glasses of wine per week   Drug use: No   Sexual activity: Never  Other Topics Concern   Not on file  Social History Narrative   Not on file   Social Determinants of Health   Financial Resource Strain: Not on file  Food Insecurity: No Food Insecurity (11/07/2022)   Hunger Vital Sign    Worried About Running Out of Food in the Last Year: Never true    Ran Out of Food in the Last Year: Never true  Transportation Needs: No Transportation Needs (11/07/2022)   PRAPARE - Administrator, Civil Service (Medical): No    Lack of Transportation (Non-Medical): No  Physical Activity: Not on file  Stress: Not on file  Social Connections: Not on file    Allergies: No Known Allergies  Metabolic Disorder Labs: Lab Results  Component Value Date   HGBA1C 5.5 11/07/2022   MPG 111 11/07/2022   MPG 111 03/17/2021   Lab Results  Component Value Date   PROLACTIN 33.3 (H) 03/17/2021   PROLACTIN 26.5 (H) 01/20/2015   Lab Results  Component Value Date   CHOL 210 (H) 11/07/2022   TRIG 144 11/07/2022   HDL 51 11/07/2022   CHOLHDL 4.1 11/07/2022   VLDL 29 11/07/2022   LDLCALC 130 (H) 11/07/2022   LDLCALC 102 (H) 03/17/2021   Lab Results  Component  Value Date   TSH 1.796 11/07/2022   TSH 1.898 03/17/2021    Therapeutic Level Labs: No results found for: "LITHIUM" Lab Results  Component Value Date   VALPROATE 46 (L) 11/20/2022   VALPROATE <10 (L) 11/07/2022   No results found for: "CBMZ"  Current Medications: Current Outpatient Medications  Medication Sig Dispense Refill   benztropine (COGENTIN) 1 MG tablet TAKE 1 TABLET(1 MG) BY MOUTH TWICE DAILY 180 tablet 0   cloNIDine (CATAPRES) 0.1 MG tablet Take 1 tablet (0.1 mg total) by mouth daily. 30 tablet 0   divalproex (DEPAKOTE ER) 500 MG 24 hr tablet Take 1 tablet (500 mg total) by mouth daily. 30 tablet 1   haloperidol (HALDOL) 10 MG tablet Take 1 tablet (10 mg total) by mouth 2 (two) times daily. 60 tablet 1   haloperidol decanoate (HALDOL DECANOATE) 100 MG/ML injection Inject 1 mL (100 mg  total) into the muscle every 30 (thirty) days. 1 mL 11   hydrOXYzine (ATARAX) 25 MG tablet Take 1 tablet (25 mg total) by mouth 3 (three) times daily as needed for anxiety. 75 tablet 1   lisinopril (ZESTRIL) 10 MG tablet Take 1 tablet (10 mg total) by mouth daily. 30 tablet 0   nicotine (NICODERM CQ - DOSED IN MG/24 HOURS) 14 mg/24hr patch Place 1 patch (14 mg total) onto the skin daily. 28 patch 0   traZODone (DESYREL) 50 MG tablet Take 1 tablet (50 mg total) by mouth at bedtime as needed for sleep. 30 tablet 1   Current Facility-Administered Medications  Medication Dose Route Frequency Provider Last Rate Last Admin   haloperidol decanoate (HALDOL DECANOATE) 100 MG/ML injection 100 mg  100 mg Intramuscular Q30 days Lauro Franklin, MD   100 mg at 01/24/23 1544     Musculoskeletal: Strength & Muscle Tone: within normal limits Gait & Station: normal Patient leans: N/A  Psychiatric Specialty Exam: Review of Systems  Psychiatric/Behavioral:  Negative for decreased concentration, dysphoric mood, hallucinations, self-injury, sleep disturbance and suicidal ideas. The patient is  nervous/anxious. The patient is not hyperactive.     There were no vitals taken for this visit.There is no height or weight on file to calculate BMI.  General Appearance: Casual  Eye Contact:  Good  Speech:  Clear and Coherent and Normal Rate  Volume:  Normal  Mood:  Anxious  Affect:  Appropriate  Thought Process:  Coherent, Goal Directed, and Descriptions of Associations: Intact  Orientation:  Full (Time, Place, and Person)  Thought Content: WDL   Suicidal Thoughts:  No  Homicidal Thoughts:  No  Memory:  Immediate;   Good Recent;   Good Remote;   Good  Judgement:  Good  Insight:  Fair  Psychomotor Activity:  Normal  Concentration:  Concentration: Good and Attention Span: Good  Recall:  Good  Fund of Knowledge: Good  Language: Good  Akathisia:  No  Handed:  Right  AIMS (if indicated): not done  Assets:  Communication Skills Desire for Improvement Social Support  ADL's:  Intact  Cognition: WNL  Sleep:  Poor   Screenings: AIMS    Flowsheet Row [facility]) [facility]  AIMS Total Score 0 0      AUDIT    Flowsheet Row [facility]) [facility] Ad[facility] ([facility]  Alcohol Use Disorder Identification Test Final Score (AUDIT) 0 2      GAD-7    Flowsheet Row Video Visit from 02/02/2023 in [facility] Office Visit from 12/05/2022 in [facility] Video Visit from 04/07/2022 in [facility] Office Visit from 12/02/2021 in [facility] Office Visit from 10/07/2021 in [facility]  Total GAD-7 Score 9 13 14 13 12       PHQ2-9    Flowsheet Row Video Visit from  02/02/2023 in [facility] Office Visit from 12/05/2022 in [facility] Video Visit from 04/07/2022 in [facility] Office Visit from 12/02/2021 in [facility] Office Visit from 10/07/2021 in [facility]  PHQ-2 Total Score 1 2 2 2 1   PHQ-9 Total Score -- 13  12 13 --      Flowsheet Row Video Visit from 02/02/2023 in [facility] Office Visit from 12/05/2022 in [facility] (Discharged[facility]  C-SSRS RISK CATEGORY No Risk No Risk No Risk        Assessment and Plan:   [name] Bartnik. is a 42 year old, African-American male with a past psychiatric history significant for schizoaffective disorder (bipolar type), anxiety, and insomnia who presents to [facility] for follow-up and medication management.  Patient presents today encounter reporting no concerns regarding his current medication regimen.  Patient denies experiencing any adverse side effects and denies the need for dosage adjustments at this time.  Patient denies depressive episodes or anxiety and appears to be stable on his current medication regimen.  Patient to continue taking medications as prescribed.  Patient's medications to be e-prescribed to pharmacy of choice.  Collaboration of Care: Collaboration of Care: Medication Management AEB provider managing patient's psychiatric medications, Primary Care Provider AEB patient was provided resources for primary care providers, and Psychiatrist AEB patient is being followed by mental health provider at this facility  Patient/Guardian was advised Release of Information must be obtained prior to any record release in order to collaborate their care with an outside provider. Patient/Guardian was  advised if they have not already done so to contact the registration department to sign all necessary forms in order for [place] to release information regarding their care.   Consent: Patient/Guardian gives verbal consent for treatment and assignment of benefits for services provided during this visit. Patient/Guardian expressed understanding and agreed to proceed.   1. Schizoaffective disorder, bipolar type (HCC)  - divalproex (DEPAKOTE ER) 500 MG 24 hr tablet; Take 1 tablet (500 mg total) by mouth daily.  Dispense: 30 tablet; Refill: 1 - haloperidol (HALDOL) 10 MG tablet; Take 1 tablet (10 mg total) by mouth 2 (two) times daily.  Dispense: 60 tablet; Refill: 1 - benztropine (COGENTIN) 1 MG tablet; TAKE 1 TABLET(1 MG) BY MOUTH TWICE DAILY  Dispense: 180 tablet; Refill: 0  2. Insomnia due to other mental disorder  - traZODone (DESYREL) 50 MG tablet; Take 1 tablet (50 mg total) by mouth at bedtime as needed for sleep.  Dispense: 30 tablet; Refill: 1  3. Anxiety state  - hydrOXYzine (ATARAX) 25 MG tablet; Take 1 tablet (25 mg total) by mouth 3 (three) times daily as needed for anxiety.  Dispense: 75 tablet; Refill: 1  Patient to follow up with shot clinic on 02/21/2023 for his long-acting injectable to be administered Provider spent a total of 12 minutes with the patient/reviewing patient's chart  Meta Hatchet, PA 02/05/2023, 10:42 AM

## 2023-02-06 ENCOUNTER — Other Ambulatory Visit (HOSPITAL_COMMUNITY): Payer: Self-pay | Admitting: Physician Assistant

## 2023-02-06 DIAGNOSIS — I10 Essential (primary) hypertension: Secondary | ICD-10-CM

## 2023-02-20 ENCOUNTER — Other Ambulatory Visit: Payer: Self-pay

## 2023-02-21 ENCOUNTER — Encounter (HOSPITAL_COMMUNITY): Payer: Self-pay

## 2023-02-21 ENCOUNTER — Ambulatory Visit (INDEPENDENT_AMBULATORY_CARE_PROVIDER_SITE_OTHER): Payer: 59 | Admitting: *Deleted

## 2023-02-21 VITALS — BP 122/86 | HR 93 | Resp 16 | Ht 69.0 in | Wt 237.2 lb

## 2023-02-21 DIAGNOSIS — F250 Schizoaffective disorder, bipolar type: Secondary | ICD-10-CM | POA: Diagnosis not present

## 2023-02-21 MED ORDER — HALOPERIDOL DECANOATE 100 MG/ML IM SOLN
100.0000 mg | Freq: Once | INTRAMUSCULAR | Status: AC
  Administered 2023-02-21: 100 mg via INTRAMUSCULAR

## 2023-02-21 NOTE — Progress Notes (Cosign Needed)
Patient arrived with his Haldol 100mg  from his pharmacy to be given. Pleasant, cooperative, laughing and joking about TV shows. States he has been trying to watch his weight and BP but visits his PCP in October. Denies SI/HI or AV hallucinations. Given in Left Deltoid. No issues or complaints.

## 2023-03-12 ENCOUNTER — Other Ambulatory Visit: Payer: Self-pay

## 2023-03-28 ENCOUNTER — Encounter (HOSPITAL_COMMUNITY): Payer: Self-pay

## 2023-03-28 ENCOUNTER — Ambulatory Visit (INDEPENDENT_AMBULATORY_CARE_PROVIDER_SITE_OTHER): Payer: 59 | Admitting: Family

## 2023-03-28 VITALS — BP 165/129 | HR 98 | Resp 18 | Ht 69.0 in | Wt 236.6 lb

## 2023-03-28 DIAGNOSIS — F250 Schizoaffective disorder, bipolar type: Secondary | ICD-10-CM

## 2023-03-28 DIAGNOSIS — I10 Essential (primary) hypertension: Secondary | ICD-10-CM

## 2023-03-28 MED ORDER — LISINOPRIL 10 MG PO TABS: 10.0000 mg | ORAL_TABLET | Freq: Every day | ORAL | 0 refills | Status: DC

## 2023-03-28 NOTE — Progress Notes (Cosign Needed)
Patient arrived with his Haldol 100mg  to be given. Pleasant, cooperative,smiling and joking with this Clinical research associate. Bright cheerful affect. BP was 165/129, recheck 190/151 and manual 168/128. Joyice Faster in to speak with patient about hypertension and medication. He is willing to restart his Lisinopril as he states he has been unable to get it refilled because he did not have an appointment. He was given education on stroke s/s and agreeable to taking his medication as directed. Injection given in Right Deltoid without issue or complaint.

## 2023-03-28 NOTE — Progress Notes (Signed)
BH MD/PA/NP OP Progress Note  03/28/2023 3:03 PM Steven Clayton  MRN:  [id number]  Chief Complaint:  Chief Complaint  Patient presents with   Injections   HPI:  Steven Clayton 42 year old African-American male presents to [facility] urgent care injection clinic for  Haldol Decanoate 100 mg/ML  Patient has a charted history with schizoaffective disorder, insomnia, generalized anxiety disorder and essential hypertension.    Steven Clayton was seen and evaluated face-to-face by this provider.  Was reported that patient had multiple elevated  blood pressure readings.  Patient denied chest pain dizziness blurred vision or shortness of breath at this time. Patient reports he has not taking hypertension medication " while."  He reports he has a follow-up appointment with [facility],  however doesn't appointment is not until September/2024.  Unsure of how long he has been off of his hypertension medication.  Patient encouraged to follow-up with local urgent care however states that due to transportation issues he is unable to follow-up at this time. Steven Clayton Clayton that the Urgent care is  "  Not on the bus route."   -Provider will make lisinopril 10 mg available x 30 days - Continue Halodl Deconate 100 mg/Ml     During evaluation Steven Clayton is sitting. He is alert/oriented x 4; calm/cooperative; and mood congruent with affect.  Patient is speaking in a clear tone at moderate volume, and normal pace; with good eye contact. His thought process is coherent and relevant; There is no indication that he is currently responding to internal/external stimuli or experiencing delusional thought content.  Patient denies suicidal/self-harm/homicidal ideation, psychosis, and paranoia.  Patient has remained calm throughout assessment and has answered questions appropriately.     Visit Diagnosis:    ICD-10-CM   1. Schizoaffective disorder, bipolar type (HCC)  F25.0     2. Essential  hypertension  I10 lisinopril (ZESTRIL) 10 MG tablet      Past Psychiatric History:   Past Medical History:  Past Medical History:  Diagnosis Date   Anxiety    Depression    Schizoaffective disorder (HCC)    Stroke (HCC)    History reviewed. No pertinent surgical history.  Family Psychiatric History:   Family History:  Family History  Problem Relation Age of Onset   Heart disease Mother    Hyperlipidemia Mother    Mental illness Father     Social History:  Social History   Socioeconomic History   Marital status: Single    Spouse name: Not on file   Number of children: Not on file   Years of education: Not on file   Highest education level: Not on file  Occupational History   Not on file  Tobacco Use   Smoking status: Some Days    Current packs/day: 2.00    Types: Cigarettes   Smokeless tobacco: Never  Vaping Use   Vaping status: Never Used  Substance and Sexual Activity   Alcohol use: Yes    Alcohol/week: 1.0 standard drink of alcohol    Types: 1 Glasses of wine per week   Drug use: No   Sexual activity: Never  Other Topics Concern   Not on file  Social History Narrative   Not on file   Social Determinants of Health   Financial Resource Strain: Not on file  Food Insecurity: No Food Insecurity (11/07/2022)   Hunger Vital Sign    Worried About Running Out of Food in the Last Year: Never true    Ran  Out of Food in the Last Year: Never true  Transportation Needs: No Transportation Needs (11/07/2022)   PRAPARE - Administrator, Civil Service (Medical): No    Lack of Transportation (Non-Medical): No  Physical Activity: Not on file  Stress: Not on file  Social Connections: Not on file    Allergies: No Known Allergies  Metabolic Disorder Labs: Lab Results  Component Value Date   HGBA1C 5.5 11/07/2022   MPG 111 11/07/2022   MPG 111 03/17/2021   Lab Results  Component Value Date   PROLACTIN 33.3 (H) 03/17/2021   PROLACTIN 26.5 (H)  01/20/2015   Lab Results  Component Value Date   CHOL 210 (H) 11/07/2022   TRIG 144 11/07/2022   HDL 51 11/07/2022   CHOLHDL 4.1 11/07/2022   VLDL 29 11/07/2022   LDLCALC 130 (H) 11/07/2022   LDLCALC 102 (H) 03/17/2021   Lab Results  Component Value Date   TSH 1.796 11/07/2022   TSH 1.898 03/17/2021    Therapeutic Level Labs: No results found for: "LITHIUM" Lab Results  Component Value Date   VALPROATE 46 (L) 11/20/2022   VALPROATE <10 (L) 11/07/2022   No results found for: "CBMZ"  Current Medications: Current Outpatient Medications  Medication Sig Dispense Refill   benztropine (COGENTIN) 1 MG tablet TAKE 1 TABLET(1 MG) BY MOUTH TWICE DAILY 180 tablet 0   cloNIDine (CATAPRES) 0.1 MG tablet TAKE 1 TABLET(0.1 MG) BY MOUTH DAILY 90 tablet 0   divalproex (DEPAKOTE ER) 500 MG 24 hr tablet Take 1 tablet (500 mg total) by mouth daily. 30 tablet 1   haloperidol (HALDOL) 10 MG tablet Take 1 tablet (10 mg total) by mouth 2 (two) times daily. 60 tablet 1   haloperidol decanoate (HALDOL DECANOATE) 100 MG/ML injection Inject 1 mL (100 mg total) into the muscle every 30 (thirty) days. 1 mL 11   hydrOXYzine (ATARAX) 25 MG tablet Take 1 tablet (25 mg total) by mouth 3 (three) times daily as needed for anxiety. 75 tablet 1   lisinopril (ZESTRIL) 10 MG tablet Take 1 tablet (10 mg total) by mouth daily. 30 tablet 0   nicotine (NICODERM CQ - DOSED IN MG/24 HOURS) 14 mg/24hr patch Place 1 patch (14 mg total) onto the skin daily. 28 patch 0   traZODone (DESYREL) 50 MG tablet Take 1 tablet (50 mg total) by mouth at bedtime as needed for sleep. 30 tablet 1   Current Facility-Administered Medications  Medication Dose Route Frequency Provider Last Rate Last Admin   haloperidol decanoate (HALDOL DECANOATE) 100 MG/ML injection 100 mg  100 mg Intramuscular Q30 days Lauro Franklin, MD   100 mg at 01/24/23 1544     Musculoskeletal: Strength & Muscle Tone: within normal limits Gait & Station:  normal Patient leans: N/A  Psychiatric Specialty Exam: Review of Systems  Blood pressure (!) 165/129, pulse 98, resp. rate 18, height 5\' 9"  (1.753 m), weight 236 lb 9.6 oz (107.3 kg), SpO2 99%.Body mass index is 34.94 kg/m.  General Appearance: Casual  Eye Contact:  Good  Speech:  Clear and Coherent  Volume:  Normal  Mood:  Anxious  Affect:  Congruent  Thought Process:  Coherent  Orientation:  Full (Time, Place, and Person)  Thought Content: Logical   Suicidal Thoughts:  No  Homicidal Thoughts:  No  Memory:  Recent;   Good Remote;   Good  Judgement:  Good  Insight:  Good  Psychomotor Activity:  Normal  Concentration:  Concentration: Good  Recall:  Good  Fund of Knowledge: Good  Language: Good  Akathisia:  No  Handed:  Right  AIMS (if indicated): done  Assets:  Communication Skills Desire for Improvement Resilience Social Support  ADL's:  Intact  Cognition: WNL  Sleep:  Good   Screenings: AIMS    Flowsheet Row [facility]) [facility]  AIMS Total Score 0 0      AUDIT    Flowsheet Row [facility]) [facility] Ad[facility] ([facility]  Alcohol Use Disorder Identification Test Final Score (AUDIT) 0 2      GAD-7    Flowsheet Row Video Visit from 02/02/2023 in [facility] Office Visit from 12/05/2022 in [facility] Video Visit from 04/07/2022 in [facility] Office Visit from 12/02/2021 in [facility] Office Visit from 10/07/2021 in [facility]  Total GAD-7 Score 9 13 14 13 12       PHQ2-9    Flowsheet Row Video Visit from 02/02/2023 in [facility] Office Visit from 12/05/2022 in [facility] Video Visit from 04/07/2022 in [facility] Office Visit from 12/02/2021 in [facility] Office Visit from 10/07/2021 in [facility]  PHQ-2 Total Score 1 2 2 2 1   PHQ-9 Total Score -- 13 12 13  --      Flowsheet Row Video Visit from 02/02/2023 in [facility] Office Visit from 12/05/2022 in [facility] (Discharged[facility]  C-SSRS RISK CATEGORY No Risk No Risk No Risk        Assessment and Plan: Tel Catton 42 year old African-American male presents to [facility] Urgent care injection clinic.- Haldol Decanoate injection.  Patient has a charted history with schizoaffective disorder, insomnia, generalized anxiety disorder and essential hypertension.  Patient reports he has not taking hypertension medication " while."  He reports he has a follow-up appointment with [facility] [facility] however, Clayton that his appointment is not until September/2024.  Unsure of how long he has been off of his hypertension medication.  Patient encouraged to follow-up with local urgent care however states that due to transportation issues he is unable to follow-up at this time. "  Not on the bus route." Patient to keep  all outpatient follow-up appointments.   -Education provided with cardiac events up to including stroke and death.  -Will make lisinopril 10 mg p.o. daily available x 30 tablets as courtesy refill    Collaboration of Care: Collaboration of Care: Medication Management AEB Haldol decanoate 100 mg follow-up 30 days.  Lisinopril 10 mg p.o. daily x 30 days no refills patient to follow-up with [place] [facility]  Patient/Guardian was advised Release of Information must be obtained prior to any  record release in order to collaborate their care with an outside provider. Patient/Guardian was advised if they have not already done so to contact the registration department to sign all necessary forms in order for [place] to release information regarding their care.   Consent: Patient/Guardian gives verbal consent for treatment and assignment of benefits for services provided during this visit. Patient/Guardian expressed understanding and agreed to proceed.  Oneta Rack, NP 03/28/2023, 3:03 PM

## 2023-04-25 ENCOUNTER — Ambulatory Visit (INDEPENDENT_AMBULATORY_CARE_PROVIDER_SITE_OTHER): Payer: 59 | Admitting: *Deleted

## 2023-04-25 ENCOUNTER — Encounter (HOSPITAL_COMMUNITY): Payer: Self-pay

## 2023-04-25 VITALS — BP 154/117 | HR 104 | Resp 16 | Ht 69.0 in | Wt 235.0 lb

## 2023-04-25 DIAGNOSIS — F250 Schizoaffective disorder, bipolar type: Secondary | ICD-10-CM

## 2023-04-25 MED ORDER — HALOPERIDOL DECANOATE 100 MG/ML IM SOLN
100.0000 mg | Freq: Once | INTRAMUSCULAR | Status: AC
  Administered 2023-04-25: 100 mg via INTRAMUSCULAR

## 2023-04-25 NOTE — Progress Notes (Signed)
Patient arrived with his Haldol 100mg  to be given. States he has no side effects and medication is doing well for him. Given in Left Deltoid without issues or complaints. Will return in 30 days.

## 2023-04-27 ENCOUNTER — Other Ambulatory Visit (HOSPITAL_COMMUNITY): Payer: Self-pay | Admitting: Family

## 2023-04-27 DIAGNOSIS — I10 Essential (primary) hypertension: Secondary | ICD-10-CM

## 2023-05-09 ENCOUNTER — Other Ambulatory Visit (HOSPITAL_COMMUNITY): Payer: Self-pay | Admitting: Physician Assistant

## 2023-05-09 DIAGNOSIS — I10 Essential (primary) hypertension: Secondary | ICD-10-CM

## 2023-05-23 ENCOUNTER — Encounter (HOSPITAL_COMMUNITY): Payer: Self-pay

## 2023-05-23 ENCOUNTER — Ambulatory Visit (INDEPENDENT_AMBULATORY_CARE_PROVIDER_SITE_OTHER): Payer: 59 | Admitting: *Deleted

## 2023-05-23 VITALS — BP 139/97 | HR 102 | Resp 16 | Ht 69.0 in | Wt 236.8 lb

## 2023-05-23 DIAGNOSIS — F250 Schizoaffective disorder, bipolar type: Secondary | ICD-10-CM

## 2023-05-23 MED ORDER — HALOPERIDOL DECANOATE 100 MG/ML IM SOLN
100.0000 mg | Freq: Once | INTRAMUSCULAR | Status: AC
Start: 2023-05-23 — End: 2023-05-23
  Administered 2023-05-23: 100 mg via INTRAMUSCULAR

## 2023-05-23 NOTE — Progress Notes (Cosign Needed)
Patient arrived with his Haldol 100mg  from his pharmacy. Given in Right Deltoid without issue or complaint. States he has no side effects. Has been taking his BP medication. BP still elevated at 139/97 and recheck at 120/99. Denies any other issues. Pleasant, cooperative, smiling and joking.

## 2023-06-02 ENCOUNTER — Other Ambulatory Visit (HOSPITAL_COMMUNITY): Payer: Self-pay | Admitting: Physician Assistant

## 2023-06-02 DIAGNOSIS — F5105 Insomnia due to other mental disorder: Secondary | ICD-10-CM

## 2023-06-02 DIAGNOSIS — F250 Schizoaffective disorder, bipolar type: Secondary | ICD-10-CM

## 2023-06-04 ENCOUNTER — Other Ambulatory Visit (HOSPITAL_COMMUNITY): Payer: Self-pay | Admitting: Physician Assistant

## 2023-06-04 DIAGNOSIS — F250 Schizoaffective disorder, bipolar type: Secondary | ICD-10-CM

## 2023-06-05 ENCOUNTER — Ambulatory Visit: Payer: 59 | Attending: Nurse Practitioner | Admitting: Nurse Practitioner

## 2023-06-05 ENCOUNTER — Encounter: Payer: Self-pay | Admitting: Nurse Practitioner

## 2023-06-05 VITALS — BP 138/90 | HR 78 | Ht 69.0 in | Wt 251.2 lb

## 2023-06-05 DIAGNOSIS — Z23 Encounter for immunization: Secondary | ICD-10-CM | POA: Diagnosis not present

## 2023-06-05 DIAGNOSIS — D72829 Elevated white blood cell count, unspecified: Secondary | ICD-10-CM | POA: Diagnosis not present

## 2023-06-05 DIAGNOSIS — I10 Essential (primary) hypertension: Secondary | ICD-10-CM | POA: Diagnosis not present

## 2023-06-05 DIAGNOSIS — Z7689 Persons encountering health services in other specified circumstances: Secondary | ICD-10-CM | POA: Diagnosis not present

## 2023-06-05 MED ORDER — LISINOPRIL 20 MG PO TABS
20.0000 mg | ORAL_TABLET | Freq: Every day | ORAL | 1 refills | Status: DC
Start: 2023-06-05 — End: 2024-03-05

## 2023-06-05 NOTE — Progress Notes (Signed)
Assessment & Plan:  Steven "ThomasWilliamsJr" was seen today for new patient (initial visit).  Diagnoses and all orders for this visit:  Encounter to establish care  Essential hypertension -     lisinopril (ZESTRIL) 20 MG tablet; Take 1 tablet (20 mg total) by mouth daily. -     CMP14+EGFR  Leukocytosis, unspecified type -     CBC with Differential    Patient has been counseled on age-appropriate routine health concerns for screening and prevention. These are reviewed and up-to-date. Referrals have been placed accordingly. Immunizations are up-to-date or declined.    Subjective:   Chief Complaint  Patient presents with   New Patient (Initial Visit)   HPI Steven Clayton 42 y.o. male presents to office today to establish care.  He has a past medical history of Anxiety, Depression, Schizoaffective disorder  and Stroke   He is followed monthly by behavioral health and on numerous psychiatric medications  He states he can feel when his blood pressure is high. It is currently elevated today and he admits adherence with lisinopril 10 mg. BP goal <130/80 BP Readings from Last 3 Encounters:  06/05/23 (!) 138/90  05/23/23 (!) 139/97  04/25/23 (!) 154/117     Review of Systems  Constitutional:  Negative for fever, malaise/fatigue and weight loss.  HENT: Negative.  Negative for nosebleeds.   Eyes: Negative.  Negative for blurred vision, double vision and photophobia.  Respiratory: Negative.  Negative for cough and shortness of breath.   Cardiovascular: Negative.  Negative for chest pain, palpitations and leg swelling.  Gastrointestinal: Negative.  Negative for heartburn, nausea and vomiting.  Musculoskeletal: Negative.  Negative for myalgias.  Neurological: Negative.  Negative for dizziness, focal weakness, seizures and headaches.  Psychiatric/Behavioral: Negative.  Negative for suicidal ideas.     Past Medical History:  Diagnosis Date   Anxiety    Depression     Schizoaffective disorder (HCC)    Stroke (HCC)     No past surgical history on file.  Family History  Problem Relation Age of Onset   Heart disease Mother    Hyperlipidemia Mother    Mental illness Father     Social History Reviewed with no changes to be made today.   Outpatient Medications Prior to Visit  Medication Sig Dispense Refill   benztropine (COGENTIN) 1 MG tablet TAKE 1 TABLET(1 MG) BY MOUTH TWICE DAILY 180 tablet 0   cloNIDine (CATAPRES) 0.1 MG tablet TAKE 1 TABLET(0.1 MG) BY MOUTH DAILY 90 tablet 0   divalproex (DEPAKOTE ER) 500 MG 24 hr tablet TAKE 1 TABLET(500 MG) BY MOUTH DAILY 90 tablet 0   haloperidol (HALDOL) 10 MG tablet TAKE 1 TABLET(10 MG) BY MOUTH TWICE DAILY 60 tablet 1   haloperidol decanoate (HALDOL DECANOATE) 100 MG/ML injection Inject 1 mL (100 mg total) into the muscle every 30 (thirty) days. 1 mL 11   hydrOXYzine (ATARAX) 25 MG tablet Take 1 tablet (25 mg total) by mouth 3 (three) times daily as needed for anxiety. 75 tablet 1   nicotine (NICODERM CQ - DOSED IN MG/24 HOURS) 14 mg/24hr patch Place 1 patch (14 mg total) onto the skin daily. 28 patch 0   traZODone (DESYREL) 50 MG tablet TAKE 1 TABLET(50 MG) BY MOUTH AT BEDTIME AS NEEDED FOR SLEEP 30 tablet 1   lisinopril (ZESTRIL) 10 MG tablet Take 1 tablet (10 mg total) by mouth daily. 30 tablet 0   No facility-administered medications prior to visit.    No Known Allergies  Objective:    BP (!) 138/90 (BP Location: Left Arm, Patient Position: Sitting, Cuff Size: Normal)   Pulse 78   Ht 5\' 9"  (1.753 m)   Wt 251 lb 3.2 oz (113.9 kg)   SpO2 99%   BMI 37.10 kg/m  Wt Readings from Last 3 Encounters:  06/05/23 251 lb 3.2 oz (113.9 kg)  05/23/23 236 lb 12.8 oz (107.4 kg)  04/25/23 235 lb (106.6 kg)    Physical Exam Vitals and nursing note reviewed.  Constitutional:      Appearance: He is well-developed.  HENT:     Head: Normocephalic and atraumatic.  Cardiovascular:     Rate and Rhythm:  Normal rate and regular rhythm.     Heart sounds: Normal heart sounds. No murmur heard.    No friction rub. No gallop.  Pulmonary:     Effort: Pulmonary effort is normal. No tachypnea or respiratory distress.     Breath sounds: Normal breath sounds. No decreased breath sounds, wheezing, rhonchi or rales.  Chest:     Chest wall: No tenderness.  Abdominal:     General: Bowel sounds are normal.     Palpations: Abdomen is soft.  Musculoskeletal:        General: Normal range of motion.     Cervical back: Normal range of motion.  Skin:    General: Skin is warm and dry.  Neurological:     Mental Status: He is alert and oriented to person, place, and time.     Coordination: Coordination normal.  Psychiatric:        Behavior: Behavior normal. Behavior is cooperative.        Thought Content: Thought content normal.        Judgment: Judgment normal.          Patient has been counseled extensively about nutrition and exercise as well as the importance of adherence with medications and regular follow-up. The patient was given clear instructions to go to ER or return to medical center if symptoms don't improve, worsen or new problems develop. The patient verbalized understanding.   Follow-up: Return in about 4 weeks (around 07/03/2023) for BP recheck me luke or angela. See me in 3 months for physical.   Claiborne Rigg, FNP-BC [facility] [place], [place] [phone]   06/05/2023, 9:22 AM

## 2023-06-06 LAB — CBC WITH DIFFERENTIAL/PLATELET
Basophils Absolute: 0.1 10*3/uL (ref 0.0–0.2)
Basos: 1 %
EOS (ABSOLUTE): 0.3 10*3/uL (ref 0.0–0.4)
Eos: 5 %
Hematocrit: 37.2 % — ABNORMAL LOW (ref 37.5–51.0)
Hemoglobin: 12.2 g/dL — ABNORMAL LOW (ref 13.0–17.7)
Immature Grans (Abs): 0 10*3/uL (ref 0.0–0.1)
Immature Granulocytes: 0 %
Lymphocytes Absolute: 2.9 10*3/uL (ref 0.7–3.1)
Lymphs: 41 %
MCH: 29.3 pg (ref 26.6–33.0)
MCHC: 32.8 g/dL (ref 31.5–35.7)
MCV: 89 fL (ref 79–97)
Monocytes Absolute: 0.8 10*3/uL (ref 0.1–0.9)
Monocytes: 11 %
Neutrophils Absolute: 3 10*3/uL (ref 1.4–7.0)
Neutrophils: 42 %
Platelets: 335 10*3/uL (ref 150–450)
RBC: 4.16 x10E6/uL (ref 4.14–5.80)
RDW: 15.2 % (ref 11.6–15.4)
WBC: 7.1 10*3/uL (ref 3.4–10.8)

## 2023-06-06 LAB — CMP14+EGFR
ALT: 52 [IU]/L — ABNORMAL HIGH (ref 0–44)
AST: 46 [IU]/L — ABNORMAL HIGH (ref 0–40)
Albumin: 4.3 g/dL (ref 4.1–5.1)
Alkaline Phosphatase: 114 [IU]/L (ref 44–121)
BUN/Creatinine Ratio: 11 (ref 9–20)
BUN: 12 mg/dL (ref 6–24)
Bilirubin Total: <0.2 mg/dL (ref 0.0–1.2)
CO2: 22 mmol/L (ref 20–29)
Calcium: 10 mg/dL (ref 8.7–10.2)
Chloride: 101 mmol/L (ref 96–106)
Creatinine, Ser: 1.09 mg/dL (ref 0.76–1.27)
Globulin, Total: 2.7 g/dL (ref 1.5–4.5)
Glucose: 82 mg/dL (ref 70–99)
Potassium: 4.4 mmol/L (ref 3.5–5.2)
Sodium: 138 mmol/L (ref 134–144)
Total Protein: 7 g/dL (ref 6.0–8.5)
eGFR: 87 mL/min/{1.73_m2} (ref >59)

## 2023-06-20 ENCOUNTER — Encounter (HOSPITAL_COMMUNITY): Payer: Self-pay

## 2023-06-20 ENCOUNTER — Ambulatory Visit (INDEPENDENT_AMBULATORY_CARE_PROVIDER_SITE_OTHER): Payer: 59

## 2023-06-20 VITALS — BP 115/84 | HR 79 | Ht 69.0 in | Wt 244.8 lb

## 2023-06-20 DIAGNOSIS — F250 Schizoaffective disorder, bipolar type: Secondary | ICD-10-CM | POA: Diagnosis not present

## 2023-06-20 MED ORDER — HALOPERIDOL DECANOATE 100 MG/ML IM SOLN
100.0000 mg | Freq: Once | INTRAMUSCULAR | Status: AC
  Administered 2023-06-20: 100 mg via INTRAMUSCULAR

## 2023-06-20 NOTE — Progress Notes (Signed)
Patient arrived with his Haldol 100mg  from his pharmacy. Given in LEFT Deltoid without issue or complaint. States he has no side effects. Has been taking his BP medication.

## 2023-07-09 ENCOUNTER — Other Ambulatory Visit (HOSPITAL_COMMUNITY): Payer: Self-pay | Admitting: Physician Assistant

## 2023-07-09 DIAGNOSIS — F250 Schizoaffective disorder, bipolar type: Secondary | ICD-10-CM

## 2023-07-16 ENCOUNTER — Ambulatory Visit: Payer: 59 | Admitting: Nurse Practitioner

## 2023-07-17 ENCOUNTER — Ambulatory Visit: Payer: 59 | Attending: Nurse Practitioner

## 2023-07-17 VITALS — Ht 69.0 in | Wt 244.0 lb

## 2023-07-17 DIAGNOSIS — Z0000 Encounter for general adult medical examination without abnormal findings: Secondary | ICD-10-CM | POA: Diagnosis not present

## 2023-07-17 NOTE — Patient Instructions (Addendum)
Steven Clayton , Thank you for taking time to come for your Medicare Wellness Visit. I appreciate your ongoing commitment to your health goals. Please review the following plan we discussed and let me know if I can assist you in the future.   Referrals/Orders/Follow-Ups/Clinician Recommendations: No  This is a list of the screening recommended for you and due dates:  Health Maintenance  Topic Date Due   COVID-19 Vaccine (3 - Moderna risk series) 04/20/2020   Hepatitis C Screening  06/04/2024*   Medicare Annual Wellness Visit  07/16/2024   DTaP/Tdap/Td vaccine (2 - Td or Tdap) 06/04/2033   Flu Shot  Completed   HIV Screening  Completed   HPV Vaccine  Aged Out  *Topic was postponed. The date shown is not the original due date.    Advanced directives: (Copy Requested) Please bring a copy of your health care power of attorney and living will to the office to be added to your chart at your convenience.  Next Medicare Annual Wellness Visit scheduled for next year: Yes

## 2023-07-17 NOTE — Progress Notes (Signed)
Subjective:   Steven Clayton is a 42 y.o. male who presents for an Initial Medicare Annual Wellness Visit.  Visit Complete: Virtual I connected with  Steven Clayton on 07/17/23 by a audio enabled telemedicine application and verified that I am speaking with the correct person using two identifiers.  Patient Location: Home  Provider Location: Home Office  I discussed the limitations of evaluation and management by telemedicine. The patient expressed understanding and agreed to proceed.  Vital Signs: Because this visit was a virtual/telehealth visit, some criteria may be missing or patient reported. Any vitals not documented were not able to be obtained and vitals that have been documented are patient reported.  Cardiac Risk Factors include: family history of premature cardiovascular disease;male gender;obesity (BMI >30kg/m2);sedentary lifestyle     Objective:    Today's Vitals   07/17/23 1343  Weight: 244 lb (110.7 kg)  Height: 5\' 9"  (1.753 m)  PainSc: 0-No pain   Body mass index is 36.03 kg/m.     07/17/2023    1:51 PM 11/07/2022    6:31 PM 04/04/2017    9:59 PM 01/18/2015    3:20 PM 01/18/2015    2:09 AM 01/18/2015    1:35 AM 01/18/2015   12:52 AM  Advanced Directives  Does Patient Have a Medical Advance Directive? Yes  No  No No No  Type of Media planner of Healthcare Power of Attorney in Chart? No - copy requested        Would patient like information on creating a medical advance directive?     No - patient declined information No - patient declined information No - patient declined information     Information is confidential and restricted. Go to Review Flowsheets to unlock data.    Current Medications (verified) Outpatient Encounter Medications as of 07/17/2023  Medication Sig   benztropine (COGENTIN) 1 MG tablet TAKE 1 TABLET(1 MG) BY MOUTH TWICE DAILY   cloNIDine (CATAPRES) 0.1 MG tablet TAKE 1 TABLET(0.1 MG) BY  MOUTH DAILY   divalproex (DEPAKOTE ER) 500 MG 24 hr tablet TAKE 1 TABLET(500 MG) BY MOUTH DAILY   haloperidol (HALDOL) 10 MG tablet TAKE 1 TABLET(10 MG) BY MOUTH TWICE DAILY   haloperidol decanoate (HALDOL DECANOATE) 100 MG/ML injection Inject 1 mL (100 mg total) into the muscle every 30 (thirty) days.   hydrOXYzine (ATARAX) 25 MG tablet Take 1 tablet (25 mg total) by mouth 3 (three) times daily as needed for anxiety.   lisinopril (ZESTRIL) 20 MG tablet Take 1 tablet (20 mg total) by mouth daily.   nicotine (NICODERM CQ - DOSED IN MG/24 HOURS) 14 mg/24hr patch Place 1 patch (14 mg total) onto the skin daily.   traZODone (DESYREL) 50 MG tablet TAKE 1 TABLET(50 MG) BY MOUTH AT BEDTIME AS NEEDED FOR SLEEP   No facility-administered encounter medications on file as of 07/17/2023.    Allergies (verified) Patient has no known allergies.   History: Past Medical History:  Diagnosis Date   Anxiety    Depression    Schizoaffective disorder (HCC)    Stroke (HCC)    No past surgical history on file. Family History  Problem Relation Age of Onset   Heart disease Mother    Hyperlipidemia Mother    Mental illness Father    Social History   Socioeconomic History   Marital status: Single    Spouse name: Not on file   Number of children:  Not on file   Years of education: Not on file   Highest education level: Not on file  Occupational History   Not on file  Tobacco Use   Smoking status: Some Days    Current packs/day: 2.00    Types: Cigarettes   Smokeless tobacco: Never  Vaping Use   Vaping status: Never Used  Substance and Sexual Activity   Alcohol use: Not Currently    Alcohol/week: 1.0 standard drink of alcohol    Types: 1 Glasses of wine per week   Drug use: No   Sexual activity: Never  Other Topics Concern   Not on file  Social History Narrative   Not on file   Social Determinants of Health   Financial Resource Strain: Low Risk  (07/17/2023)   Overall Financial Resource  Strain (CARDIA)    Difficulty of Paying Living Expenses: Not hard at all  Food Insecurity: No Food Insecurity (07/17/2023)   Hunger Vital Sign    Worried About Running Out of Food in the Last Year: Never true    Ran Out of Food in the Last Year: Never true  Transportation Needs: No Transportation Needs (07/17/2023)   PRAPARE - Administrator, Civil Service (Medical): No    Lack of Transportation (Non-Medical): No  Physical Activity: Inactive (07/17/2023)   Exercise Vital Sign    Days of Exercise per Week: 0 days    Minutes of Exercise per Session: 0 min  Stress: No Stress Concern Present (07/17/2023)   [name] of Occupational Health - Occupational Stress Questionnaire    Feeling of Stress : Not at all  Social Connections: Unknown (07/17/2023)   Social Connection and Isolation Panel [NHANES]    Frequency of Communication with Friends and Family: More than three times a week    Frequency of Social Gatherings with Friends and Family: More than three times a week    Attends Religious Services: Not on Marketing executive or Organizations: Yes    Attends Engineer, structural: More than 4 times per year    Marital Status: Never married    Tobacco Counseling Ready to quit: Not Answered Counseling given: Not Answered   Clinical Intake:  Pre-visit preparation completed: Yes  Pain : No/denies pain Pain Score: 0-No pain     BMI - recorded: 36.03 Nutritional Status: BMI > 30  Obese Nutritional Risks: None Diabetes: No  How often do you need to have someone help you when you read instructions, pamphlets, or other written materials from your doctor or pharmacy?: 1 - Never What is the last grade level you completed in school?: HSG; SOME COLLEGE  Interpreter Needed?: No  Information entered by :: Kemoni Quesenberry N. Lavonne Kinderman, LPN.   Activities of Daily Living    07/17/2023    1:56 PM 11/07/2022    6:40 PM  In your present state of health, do  you have any difficulty performing the following activities:  Hearing? 0   Vision? 0   Difficulty concentrating or making decisions? 0   Walking or climbing stairs? 0   Dressing or bathing? 0   Doing errands, shopping? 0   Preparing Food and eating ? N   Using the Toilet? N   In the past six months, have you accidently leaked urine? N   Do you have problems with loss of bowel control? N   Managing your Medications? N   Managing your Finances? N   Housekeeping or managing  your Housekeeping? N      Information is confidential and restricted. Go to Review Flowsheets to unlock data.    Patient Care Team: Claiborne Rigg, NP as PCP - General (Nurse Practitioner)  Indicate any recent Medical Services you may have received from other than [facility] providers in the past year (date may be approximate).     Assessment:   This is a routine wellness examination for [facility].  Hearing/Vision screen Hearing Screening - Comments:: Denies hearing difficulties.   Vision Screening - Comments:: Patient wears no eyeglasses, contacts or readers. Not up to date with eye exam.   Goals Addressed             This Visit's Progress    Client understands the importance of follow-up with providers by attending scheduled visits        Depression Screen    07/17/2023    1:54 PM 02/02/2023    3:37 PM 12/05/2022   10:34 AM 04/07/2022    3:44 PM 12/02/2021    3:31 PM 10/07/2021    2:34 PM 07/29/2021    2:33 PM  PHQ 2/9 Scores  PHQ - 2 Score 0        PHQ- 9 Score 0           Information is confidential and restricted. Go to Review Flowsheets to unlock data.    Fall Risk    07/17/2023    1:52 PM 12/24/2017    4:15 PM  Fall Risk   Falls in the past year? 0 No  Number falls in past yr: 0   Injury with Fall? 0   Risk for fall due to : No Fall Risks   Follow up Falls prevention discussed     MEDICARE RISK AT HOME: Medicare Risk at Home Any stairs in or around the home?: No If so, are there any  without handrails?: No Home free of loose throw rugs in walkways, pet beds, electrical cords, etc?: Yes Adequate lighting in your home to reduce risk of falls?: Yes Life alert?: No Use of a cane, walker or w/c?: No Grab bars in the bathroom?: No Shower chair or bench in shower?: No Elevated toilet seat or a handicapped toilet?: No  TIMED UP AND GO:  Was the test performed? No    Cognitive Function:    07/17/2023    1:53 PM  MMSE - Mini Mental State Exam  Not completed: Unable to complete        07/17/2023    1:53 PM  6CIT Screen  What Year? 0 points  What month? 0 points  What time? 0 points  Count back from 20 0 points  Months in reverse 0 points  Repeat phrase 0 points  Total Score 0 points    Immunizations Immunization History  Administered Date(s) Administered   Influenza, Seasonal, Injecte, Preservative Fre 06/05/2023   Moderna Sars-Covid-2 Vaccination 02/24/2020, 03/23/2020   Pneumococcal Polysaccharide-23 01/19/2015   Tdap 06/05/2023    TDAP status: Up to date  Flu Vaccine status: Up to date  Pneumococcal vaccine status: Declined,  Education has been provided regarding the importance of this vaccine but patient still declined. Advised may receive this vaccine at local pharmacy or Health Dept. Aware to provide a copy of the vaccination record if obtained from local pharmacy or Health Dept. Verbalized acceptance and understanding.   Covid-19 vaccine status: Completed vaccines  Qualifies for Shingles Vaccine? No   Zostavax completed No   Shingrix Completed?: No.  Education has been provided regarding the importance of this vaccine. Patient has been advised to call insurance company to determine out of pocket expense if they have not yet received this vaccine. Advised may also receive vaccine at local pharmacy or Health Dept. Verbalized acceptance and understanding.  Screening Tests Health Maintenance  Topic Date Due   COVID-19 Vaccine (3 - Moderna risk  series) 04/20/2020   Hepatitis C Screening  06/04/2024 (Originally 06/30/1999)   Medicare Annual Wellness (AWV)  07/16/2024   DTaP/Tdap/Td (2 - Td or Tdap) 06/04/2033   INFLUENZA VACCINE  Completed   HIV Screening  Completed   HPV VACCINES  Aged Out    Health Maintenance  Health Maintenance Due  Topic Date Due   COVID-19 Vaccine (3 - Moderna risk series) 04/20/2020    Colorectal cancer screening: Never done.  Lung Cancer Screening: (Low Dose CT Chest recommended if Age 43-80 years, 20 pack-year currently smoking OR have quit w/in 15years.) does not qualify.   Lung Cancer Screening Referral: no  Additional Screening:  Hepatitis C Screening: does qualify; Completed: Never done; has been postponed until 06/04/2024.  Vision Screening: Recommended annual ophthalmology exams for early detection of glaucoma and other disorders of the eye. Is the patient up to date with their annual eye exam?  No  Who is the provider or what is the name of the office in which the patient attends annual eye exams? Patient declined. If pt is not established with a provider, would they like to be referred to a provider to establish care? No .   Dental Screening: Recommended annual dental exams for proper oral hygiene  Diabetic Foot Exam: N/A  Community Resource Referral / Chronic Care Management: CRR required this visit?  No   CCM required this visit?  No    Plan:     I have personally reviewed and noted the following in the patient's chart:   Medical and social history Use of alcohol, tobacco or illicit drugs  Current medications and supplements including opioid prescriptions. Patient is not currently taking opioid prescriptions. Functional ability and status Nutritional status Physical activity Advanced directives List of other physicians Hospitalizations, surgeries, and ER visits in previous 12 months Vitals Screenings to include cognitive, depression, and falls Referrals and  appointments  In addition, I have reviewed and discussed with patient certain preventive protocols, quality metrics, and best practice recommendations. A written personalized care plan for preventive services as well as general preventive health recommendations were provided to patient.     Mickeal Needy, LPN   [id number]   After Visit Summary: (MyChart) Due to this being a telephonic visit, the after visit summary with patients personalized plan was offered to patient via MyChart   Nurse Notes: None

## 2023-07-18 ENCOUNTER — Encounter (HOSPITAL_COMMUNITY): Payer: Self-pay | Admitting: Physician Assistant

## 2023-07-18 ENCOUNTER — Ambulatory Visit (INDEPENDENT_AMBULATORY_CARE_PROVIDER_SITE_OTHER): Payer: 59

## 2023-07-18 ENCOUNTER — Encounter (HOSPITAL_COMMUNITY): Payer: Self-pay

## 2023-07-18 ENCOUNTER — Ambulatory Visit (INDEPENDENT_AMBULATORY_CARE_PROVIDER_SITE_OTHER): Payer: 59 | Admitting: Physician Assistant

## 2023-07-18 ENCOUNTER — Other Ambulatory Visit (HOSPITAL_COMMUNITY): Payer: Self-pay | Admitting: Physician Assistant

## 2023-07-18 ENCOUNTER — Ambulatory Visit (HOSPITAL_COMMUNITY): Payer: 59

## 2023-07-18 VITALS — BP 154/123 | HR 82 | Temp 98.2°F | Ht 71.0 in | Wt 255.0 lb

## 2023-07-18 DIAGNOSIS — F411 Generalized anxiety disorder: Secondary | ICD-10-CM | POA: Diagnosis not present

## 2023-07-18 DIAGNOSIS — I10 Essential (primary) hypertension: Secondary | ICD-10-CM

## 2023-07-18 DIAGNOSIS — F250 Schizoaffective disorder, bipolar type: Secondary | ICD-10-CM

## 2023-07-18 DIAGNOSIS — F5105 Insomnia due to other mental disorder: Secondary | ICD-10-CM

## 2023-07-18 DIAGNOSIS — F99 Mental disorder, not otherwise specified: Secondary | ICD-10-CM

## 2023-07-18 MED ORDER — HYDROXYZINE HCL 25 MG PO TABS: 25.0000 mg | ORAL_TABLET | Freq: Three times a day (TID) | ORAL | 1 refills | Status: DC | PRN

## 2023-07-18 MED ORDER — BENZTROPINE MESYLATE 1 MG PO TABS: ORAL_TABLET | ORAL | 1 refills | Status: DC

## 2023-07-18 MED ORDER — CLONIDINE HCL 0.1 MG PO TABS: 0.1000 mg | ORAL_TABLET | Freq: Every day | ORAL | 0 refills | Status: DC

## 2023-07-18 MED ORDER — HALOPERIDOL 10 MG PO TABS: 10.0000 mg | ORAL_TABLET | Freq: Two times a day (BID) | ORAL | 1 refills | Status: DC

## 2023-07-18 MED ORDER — DIVALPROEX SODIUM ER 500 MG PO TB24: 500.0000 mg | ORAL_TABLET | Freq: Every day | ORAL | 1 refills | Status: DC

## 2023-07-18 MED ORDER — HALOPERIDOL DECANOATE 100 MG/ML IM SOLN
100.0000 mg | INTRAMUSCULAR | Status: AC
  Administered 2023-07-18 – 2023-11-15 (×3): 100 mg via INTRAMUSCULAR

## 2023-07-18 MED ORDER — TRAZODONE HCL 50 MG PO TABS: 50.0000 mg | ORAL_TABLET | Freq: Every evening | ORAL | 1 refills | Status: DC | PRN

## 2023-07-18 NOTE — Progress Notes (Cosign Needed)
BH MD/PA/NP OP Progress Note  Virtual Visit via Video Note  I connected with Steven Clayton on 07/18/23 at  9:00 AM EST by a video enabled telemedicine application and verified that I am speaking with the correct person using two identifiers.  Location: Patient: Home Provider: Clinic   I discussed the limitations of evaluation and management by telemedicine and the availability of in person appointments. The patient expressed understanding and agreed to proceed.  Follow Up Instructions:   I discussed the assessment and treatment plan with the patient. The patient was provided an opportunity to ask questions and all were answered. The patient agreed with the plan and demonstrated an understanding of the instructions.   The patient was advised to call back or seek an in-person evaluation if the symptoms worsen or if the condition fails to improve as anticipated.  I provided 14 minutes of non-face-to-face time during this encounter.  Steven Hatchet, PA    07/18/2023 12:54 PM Steven Clayton  MRN:  [id number]  Chief Complaint:  Chief Complaint  Patient presents with   Follow-up   Medication Refill   HPI:   Steven Clayton. is a 42 year old, African-American male with a past psychiatric history significant for schizoaffective disorder (bipolar type), anxiety, and insomnia who presents to [facility] for follow-up and medication management. Patient was last seen by this provider on 02/02/2023. Patient is currently being managed on the following psychiatric medications:  Benztropine 1 mg 2 times daily Divalproex 500 mg 24-hour tablet daily Haloperidol 10 mg 2 times daily Haloperidol decanoate 100 mg/mL injection (first dose given on 11/18/2022) Hydroxyzine 25 mg 3 times daily as needed Trazodone 50 mg at bedtime  Patient reports that he recently got a new primary care provider at [facility].  Prior to this encounter,  patient's blood pressure was slightly elevated.  Patient reports that he is taking lisinopril in addition to clonidine.  In regard to his psychiatric medications, patient reports no issues or concerns.  Patient denies experiencing any adverse side effects at this time.  Patient denies overt depressive symptoms but does endorse anxiety stating that he often thinks that something bad is about to happen.  Patient denies auditory or visual hallucinations and does not appear to be responding to internal/external stimuli.  Patient further denies paranoia.  Patient is requesting medication refills following the conclusion of this encounter.  A PHQ-9 screen was performed with the patient scoring at 13.  A GAD-7 screen was also performed with the patient scoring a 14.  Patient is alert and oriented x 4, calm, cooperative, and fully engaged in conversation during the encounter.  Patient endorses okay mood.  Patient denies suicidal or homicidal ideations.  He further denies auditory or visual hallucinations and does not appear to be responding to internal/external stimuli.  Patient endorses good sleep and receives on average 8 hours of sleep per night.  Patient endorses good appetite and eats on average 2-3 meals per day.  Patient denies alcohol consumption or illicit drug use.  Patient endorses tobacco use and smokes on average 3 cigarettes/day.  Visit Diagnosis:    ICD-10-CM   1. Anxiety state  F41.1 hydrOXYzine (ATARAX) 25 MG tablet    2. Schizoaffective disorder, bipolar type (HCC)  F25.0 divalproex (DEPAKOTE ER) 500 MG 24 hr tablet    haloperidol (HALDOL) 10 MG tablet    benztropine (COGENTIN) 1 MG tablet    3. Insomnia due to other mental disorder  F51.05 traZODone (  DESYREL) 50 MG tablet   F99     4. Essential hypertension  I10 cloNIDine (CATAPRES) 0.1 MG tablet      Past Psychiatric History:  Patient has a past psychiatric history significant for the following: Schizoaffective disorder (bipolar  type), anxiety, and insomnia  Patient has a past history of hospitalization due to mental health and was recently hospitalized on 11/07/2022.   Past Medical History:  Past Medical History:  Diagnosis Date   Anxiety    Depression    Schizoaffective disorder (HCC)    Stroke [facility])    History reviewed. No pertinent surgical history.  Family Psychiatric History:  Patient denies any specific family history of psychiatric  illness. He does report that his father suffered traumatic brain injury while being in the [employer].   Mother - major depressive disorder  Family History:  Family History  Problem Relation Age of Onset   Heart disease Mother    Hyperlipidemia Mother    Mental illness Father     Social History:  Social History   Socioeconomic History   Marital status: Single    Spouse name: Not on file   Number of children: Not on file   Years of education: Not on file   Highest education level: Not on file  Occupational History   Not on file  Tobacco Use   Smoking status: Some Days    Current packs/day: 2.00    Types: Cigarettes   Smokeless tobacco: Never  Vaping Use   Vaping status: Never Used  Substance and Sexual Activity   Alcohol use: Not Currently    Alcohol/week: 1.0 standard drink of alcohol    Types: 1 Glasses of wine per week   Drug use: No   Sexual activity: Never  Other Topics Concern   Not on file  Social History Narrative   Not on file   Social Determinants of Health   Financial Resource Strain: Low Risk  (07/17/2023)   Overall Financial Resource Strain (CARDIA)    Difficulty of Paying Living Expenses: Not hard at all  Food Insecurity: No Food Insecurity (07/17/2023)   Hunger Vital Sign    Worried About Running Out of Food in the Last Year: Never true    Ran Out of Food in the Last Year: Never true  Transportation Needs: No Transportation Needs (07/17/2023)   PRAPARE - Administrator, Civil Service (Medical): No    Lack of  Transportation (Non-Medical): No  Physical Activity: Inactive (07/17/2023)   Exercise Vital Sign    Days of Exercise per Week: 0 days    Minutes of Exercise per Session: 0 min  Stress: No Stress Concern Present (07/17/2023)   [name] of Occupational Health - Occupational Stress Questionnaire    Feeling of Stress : Not at all  Social Connections: Unknown (07/17/2023)   Social Connection and Isolation Panel [NHANES]    Frequency of Communication with Friends and Family: More than three times a week    Frequency of Social Gatherings with Friends and Family: More than three times a week    Attends Religious Services: Not on file    Active Member of Clubs or Organizations: Yes    Attends Banker Meetings: More than 4 times per year    Marital Status: Never married    Allergies: No Known Allergies  Metabolic Disorder Labs: Lab Results  Component Value Date   HGBA1C 5.5 11/07/2022   MPG 111 11/07/2022   MPG 111 03/17/2021  Lab Results  Component Value Date   PROLACTIN 33.3 (H) 03/17/2021   PROLACTIN 26.5 (H) 01/20/2015   Lab Results  Component Value Date   CHOL 210 (H) 11/07/2022   TRIG 144 11/07/2022   HDL 51 11/07/2022   CHOLHDL 4.1 11/07/2022   VLDL 29 11/07/2022   LDLCALC 130 (H) 11/07/2022   LDLCALC 102 (H) 03/17/2021   Lab Results  Component Value Date   TSH 1.796 11/07/2022   TSH 1.898 03/17/2021    Therapeutic Level Labs: No results found for: "LITHIUM" Lab Results  Component Value Date   VALPROATE 46 (L) 11/20/2022   VALPROATE <10 (L) 11/07/2022   No results found for: "CBMZ"  Current Medications: Current Outpatient Medications  Medication Sig Dispense Refill   benztropine (COGENTIN) 1 MG tablet TAKE 1 TABLET(1 MG) BY MOUTH TWICE DAILY 60 tablet 1   cloNIDine (CATAPRES) 0.1 MG tablet Take 1 tablet (0.1 mg total) by mouth daily. 30 tablet 0   divalproex (DEPAKOTE ER) 500 MG 24 hr tablet Take 1 tablet (500 mg total) by mouth daily.  30 tablet 1   haloperidol (HALDOL) 10 MG tablet Take 1 tablet (10 mg total) by mouth 2 (two) times daily. 60 tablet 1   haloperidol decanoate (HALDOL DECANOATE) 100 MG/ML injection Inject 1 mL (100 mg total) into the muscle every 30 (thirty) days. 1 mL 11   hydrOXYzine (ATARAX) 25 MG tablet Take 1 tablet (25 mg total) by mouth 3 (three) times daily as needed for anxiety. 75 tablet 1   lisinopril (ZESTRIL) 20 MG tablet Take 1 tablet (20 mg total) by mouth daily. 90 tablet 1   nicotine (NICODERM CQ - DOSED IN MG/24 HOURS) 14 mg/24hr patch Place 1 patch (14 mg total) onto the skin daily. 28 patch 0   traZODone (DESYREL) 50 MG tablet Take 1 tablet (50 mg total) by mouth at bedtime as needed for sleep. 30 tablet 1   Current Facility-Administered Medications  Medication Dose Route Frequency Provider Last Rate Last Admin   haloperidol decanoate (HALDOL DECANOATE) 100 MG/ML injection 100 mg  100 mg Intramuscular Q30 days Fue Cervenka E, PA   100 mg at 07/18/23 [id number]     Musculoskeletal: Strength & Muscle Tone: within normal limits Gait & Station: normal Patient leans: N/A  Psychiatric Specialty Exam: Review of Systems  Psychiatric/Behavioral:  Negative for decreased concentration, dysphoric mood, hallucinations, self-injury, sleep disturbance and suicidal ideas. The patient is nervous/anxious. The patient is not hyperactive.     Blood pressure (!) 147/109, pulse 84, temperature 98.2 F (36.8 C), height 5\' 11"  (1.803 m), weight 255 lb (115.7 kg), SpO2 100%.Body mass index is 35.57 kg/m.  General Appearance: Casual  Eye Contact:  Good  Speech:  Clear and Coherent and Normal Rate  Volume:  Normal  Mood:  Anxious  Affect:  Appropriate  Thought Process:  Coherent, Goal Directed, and Descriptions of Associations: Intact  Orientation:  Full (Time, Place, and Person)  Thought Content: WDL   Suicidal Thoughts:  No  Homicidal Thoughts:  No  Memory:  Immediate;   Good Recent;   Good Remote;    Good  Judgement:  Good  Insight:  Fair  Psychomotor Activity:  Normal  Concentration:  Concentration: Good and Attention Span: Good  Recall:  Good  Fund of Knowledge: Good  Language: Good  Akathisia:  No  Handed:  Right  AIMS (if indicated): not done  Assets:  Communication Skills Desire for Improvement Social Support  ADL's:  Intact  Cognition: WNL  Sleep:  Good   Screenings: AIMS    Flowsheet Row [facility]  AIMS Total Score 0 0      AUDIT    Flowsheet Row [facility]) [facility] Ad[facility] ([facility]  Alcohol Use Disorder Identification Test Final Score (AUDIT) 0 2      GAD-7    Flowsheet Row Office Visit from 07/18/2023 in [facility] Video Visit from 02/02/2023 in [facility] Office Visit from 12/05/2022 in [facility] Video Visit from 04/07/2022 in [facility] Office Visit from 12/02/2021 in [facility]  Total GAD-7 Score 14 9 13 14 13       [id number]    Flowsheet Row Office Visit from 07/18/2023 in [facility] Clinical Support from 07/17/2023 in [facility] Video Visit from 02/02/2023 in [facility] Office Visit from 12/05/2022 in [facility] Video Visit from 04/07/2022 in [facility]  PHQ-2 Total Score 3 0 1 2 2   PHQ-9 Total Score 13 0 -- 13 12      Flowsheet Row Office Visit from 07/18/2023 in [facility] Video Visit from 02/02/2023 in  [facility] Office Visit from 12/05/2022 in [facility]  C-SSRS RISK CATEGORY No Risk No Risk No Risk        Assessment and Plan:   Steven Krom. is a 42 year old, African-American male with a past psychiatric history significant for schizoaffective disorder (bipolar type), anxiety, and insomnia who presents to [facility] for follow-up and medication management.  Patient presents today encounter requesting refills on his medications.  Patient reports no issues or concerns regarding his current medication regimen.  Patient denies experiencing any adverse side effects at this time.  Patient denies overt depressive symptoms but does endorse some anxiety.  Patient denies auditory or visual hallucinations and further denies paranoia.  Patient endorses stability on his current medication regimen.  Patient's medication to be prescribed to pharmacy of choice.  Prior to the initial visit, patient's blood pressure was elevated.  Patient reports that he is currently taking lisinopril for the management of his blood pressure.  Patient also has a past history of taking clonidine.  Provider to provide patient a 30-day supply of clonidine for the management of his blood pressure.  Patient vocalized understanding.  Provider encouraged patient to follow up with his primary care provider regarding his hypertension.  Collaboration of Care: Collaboration of Care: Medication Management AEB provider managing patient's psychiatric medications, Primary Care Provider AEB patient was provided resources for primary care providers, and Psychiatrist AEB patient is being followed by mental health provider at this facility  Patient/Guardian was advised Release of Information must be obtained prior to any record release in order to collaborate their care with an outside provider. Patient/Guardian was advised if they have not  already done so to contact the registration department to sign all necessary forms in order for [place] to release information regarding their care.   Consent: Patient/Guardian gives verbal consent for treatment and assignment of benefits for services  provided during this visit. Patient/Guardian expressed understanding and agreed to proceed.   1. Anxiety state  - hydrOXYzine (ATARAX) 25 MG tablet; Take 1 tablet (25 mg total) by mouth 3 (three) times daily as needed for anxiety.  Dispense: 75 tablet; Refill: 1  2. Schizoaffective disorder, bipolar type (HCC)  - divalproex (DEPAKOTE ER) 500 MG 24 hr tablet; Take 1 tablet (500 mg total) by mouth daily.  Dispense: 30 tablet; Refill: 1 - haloperidol (HALDOL) 10 MG tablet; Take 1 tablet (10 mg total) by mouth 2 (two) times daily.  Dispense: 60 tablet; Refill: 1 - benztropine (COGENTIN) 1 MG tablet; TAKE 1 TABLET(1 MG) BY MOUTH TWICE DAILY  Dispense: 60 tablet; Refill: 1  3. Insomnia due to other mental disorder  - traZODone (DESYREL) 50 MG tablet; Take 1 tablet (50 mg total) by mouth at bedtime as needed for sleep.  Dispense: 30 tablet; Refill: 1  4. Essential hypertension  - cloNIDine (CATAPRES) 0.1 MG tablet; Take 1 tablet (0.1 mg total) by mouth daily.  Dispense: 30 tablet; Refill: 0  Patient to follow up with shot clinic on 08/16/2023 for his long-acting injectable to be administered Provider spent a total of 14 minutes with the patient/reviewing patient's chart  Steven Hatchet, PA 07/18/2023, 12:54 PM

## 2023-07-18 NOTE — Progress Notes (Cosign Needed)
Patient presented with appropriate affect, level and pleasant mood and in for due Haldol Decanoate 100 mg/ml IM every 30 day injection.  Patient's blood pressure up today as initially was 154/123 and then after approximately 10 minutes was 147/109.  Patient agreed to see Otila Back, PA today for eval and informed PA of patient's high blood pressure this morning.  Patient stated he did take his Lisinopril today and has been taking most days.  After patient was evaluated by Otila Back, PA he returned and obtain his due injection as could get today with office closed the rest of the week for Thanksgiving Holiday.  Provider approved injection today and patient denied any current suicidal or homicidal ideations, no plan, intent or means to want to harm self.  Patient denied any current active auditory or visual hallucinations and no other symptoms at this time.  Patient's due Haldol Decanoate 100 mg/ml IM injection prepared as ordered and given to patient in his Right Deltoid area.  Patient tolerated due injection without complaint of pain or discomfort and agreed to return in 30 days for his next injection.  Patient to call if any issues or concerns prior to next appointment.

## 2023-07-31 NOTE — Progress Notes (Signed)
I have reviewed and agree with the AWV documentation Attestation signed by Bertram Denver FNP-BC on  07-31-2023

## 2023-08-11 ENCOUNTER — Other Ambulatory Visit (HOSPITAL_COMMUNITY): Payer: Self-pay | Admitting: Physician Assistant

## 2023-08-11 DIAGNOSIS — F5105 Insomnia due to other mental disorder: Secondary | ICD-10-CM

## 2023-08-16 ENCOUNTER — Ambulatory Visit (INDEPENDENT_AMBULATORY_CARE_PROVIDER_SITE_OTHER): Payer: 59

## 2023-08-16 ENCOUNTER — Encounter (HOSPITAL_COMMUNITY): Payer: Self-pay

## 2023-08-16 VITALS — BP 134/109 | HR 87 | Ht 71.0 in | Wt 276.0 lb

## 2023-08-16 DIAGNOSIS — F250 Schizoaffective disorder, bipolar type: Secondary | ICD-10-CM

## 2023-08-16 MED ORDER — TRAZODONE HCL 100 MG PO TABS: 100.0000 mg | ORAL_TABLET | Freq: Every evening | ORAL | 3 refills | Status: DC | PRN

## 2023-08-16 NOTE — Progress Notes (Cosign Needed Addendum)
Patient presented with flat affect, level and pleasant mood but stated he has not been sleeping as well later.  Patient denied any current auditory or visual hallucinations, no suicidal or homicidal ideations, plans, intent, or means to want to harm self or others.  Patient with elevated blood pressures today and discussed with Dr. Toy Cookey, NP who met with patient and reviewed his BPs and issues with not sleeping as well.  Collateral agreed to increase patient's Trazodone to 100 mg at bedtime as needed and discussed concerns for his due Haldol Decanoate 100 mg/ml IM injection that may increase his blood pressure more.  Patient stated he did not want to face issues he may get by going without his due injection and wanted to have this today.  Patient stated taking his Lisinopril today and tries daily but did miss some when he had problems getting the medication filled.  Patient stated he does have problems getting back and forth to the [facility] to get medications when they do not have them ready for pick up.  Reviewed all medications with patient and wrote these down for him, per his request and also assisted him with 3 bus passes to pick up medications and to return for next due injection.  Patient's due Haldol Decanoate 100 mg/ml IM injection prepared as ordered and given to patient in his Left Deltoid area.  Patient tolerated due injection without any complaint of pain or discomfort and agreed to call if any issues prior to next appointment.  Patient agreed to call [facility] later today to make sure they had all his medications he needs to pick up ready for pick up and to then go by bus to get these.  Patient encouraged to also follow up with PCP if blood pressures continued to be elevated.  Patient stated "I had these up before and got them down and I will do that again".  Order for Trazodone 100 mg, one at bedtime as needed, #30 with 3 refills entered from verbal order by Dr. Toy Cookey, NP this  date and sent to patient's [facility].

## 2023-09-10 ENCOUNTER — Other Ambulatory Visit (HOSPITAL_COMMUNITY): Payer: Self-pay | Admitting: Physician Assistant

## 2023-09-10 DIAGNOSIS — F250 Schizoaffective disorder, bipolar type: Secondary | ICD-10-CM

## 2023-09-13 ENCOUNTER — Ambulatory Visit (HOSPITAL_COMMUNITY): Payer: 59

## 2023-09-13 ENCOUNTER — Telehealth (HOSPITAL_COMMUNITY): Payer: Self-pay

## 2023-09-13 NOTE — Telephone Encounter (Signed)
Message acknowledged and reviewed.

## 2023-09-13 NOTE — Telephone Encounter (Signed)
Patient presents to the office for [facility] clinic PT was informed that due to his hypertension being elevated 157/119 and 154/110. Pt was told That he wouldn't be able to receive his injection . Pt was informed that he needed to follow up with his PCP to have his Lisinopril 20 mg increased. Pt stated he has an appointment on January 29th with PCP. Pt stated he understood and would follow up with [facility] clinic next Thursday

## 2023-09-13 NOTE — Telephone Encounter (Signed)
Provider informed patient that his blood pressure could potentially spike after receiving his injection and was concerned about the elevation in his blood pressure.  Provider recommended that patient follow-up with his primary care doctor regarding elevations in blood pressure and suggested they discuss adjustments to his lisinopril.  He endorsed understanding and agreed.  Patient is scheduled to see Ms. Bertram Denver on 09/19/2023.  Patient also notes that he has been having issues sleeping.  Provider recommended that his trazodone be increased from 50 mg to 100 mg.  Patient notes that he has enough medications to do this until he follows back up with his primary psychiatric provider Mr. Karel Jarvis.  He will follow back up to receive his monthly injection on 09/20/2023 after speaking to his primary care doctor about his increased blood pressure.  No other concerns noted at this time.

## 2023-09-18 ENCOUNTER — Telehealth (INDEPENDENT_AMBULATORY_CARE_PROVIDER_SITE_OTHER): Payer: 59 | Admitting: Physician Assistant

## 2023-09-18 ENCOUNTER — Encounter: Payer: 59 | Admitting: Nurse Practitioner

## 2023-09-18 DIAGNOSIS — F411 Generalized anxiety disorder: Secondary | ICD-10-CM | POA: Diagnosis not present

## 2023-09-18 DIAGNOSIS — F250 Schizoaffective disorder, bipolar type: Secondary | ICD-10-CM

## 2023-09-18 DIAGNOSIS — F5105 Insomnia due to other mental disorder: Secondary | ICD-10-CM

## 2023-09-19 ENCOUNTER — Ambulatory Visit: Payer: 59 | Attending: Nurse Practitioner | Admitting: Nurse Practitioner

## 2023-09-19 ENCOUNTER — Encounter (HOSPITAL_COMMUNITY): Payer: Self-pay | Admitting: Physician Assistant

## 2023-09-19 ENCOUNTER — Encounter: Payer: Self-pay | Admitting: Nurse Practitioner

## 2023-09-19 VITALS — BP 117/81 | HR 80 | Resp 20 | Ht 71.0 in | Wt 271.0 lb

## 2023-09-19 DIAGNOSIS — R7309 Other abnormal glucose: Secondary | ICD-10-CM

## 2023-09-19 DIAGNOSIS — Z23 Encounter for immunization: Secondary | ICD-10-CM

## 2023-09-19 DIAGNOSIS — I10 Essential (primary) hypertension: Secondary | ICD-10-CM

## 2023-09-19 DIAGNOSIS — Z5181 Encounter for therapeutic drug level monitoring: Secondary | ICD-10-CM

## 2023-09-19 DIAGNOSIS — Z0000 Encounter for general adult medical examination without abnormal findings: Secondary | ICD-10-CM

## 2023-09-19 MED ORDER — DIVALPROEX SODIUM ER 500 MG PO TB24: 500.0000 mg | ORAL_TABLET | Freq: Every day | ORAL | 0 refills | Status: DC

## 2023-09-19 MED ORDER — BENZTROPINE MESYLATE 1 MG PO TABS: ORAL_TABLET | ORAL | 0 refills | Status: DC

## 2023-09-19 MED ORDER — HYDROXYZINE HCL 25 MG PO TABS: 25.0000 mg | ORAL_TABLET | Freq: Three times a day (TID) | ORAL | 1 refills | Status: DC | PRN

## 2023-09-19 MED ORDER — HALOPERIDOL 10 MG PO TABS: 10.0000 mg | ORAL_TABLET | Freq: Two times a day (BID) | ORAL | 0 refills | Status: DC

## 2023-09-19 MED ORDER — TRAZODONE HCL 100 MG PO TABS
100.0000 mg | ORAL_TABLET | Freq: Every evening | ORAL | 0 refills | Status: DC | PRN
Start: 2023-09-19 — End: 2023-11-14

## 2023-09-19 NOTE — Progress Notes (Signed)
Assessment & Plan:  Steven Clayton was seen today for annual exam.  Diagnoses and all orders for this visit:  Encounter for annual physical exam -     CBC with Differential -     CMP14+EGFR -     Hemoglobin A1c -     Lipid panel  Therapeutic drug monitoring -     Valproic acid level  Primary hypertension -     CMP14+EGFR  Elevated glucose -     CMP14+EGFR -     Hemoglobin A1c    Patient has been counseled on age-appropriate routine health concerns for screening and prevention. These are reviewed and up-to-date. Referrals have been placed accordingly. Immunizations are up-to-date or declined.    Subjective:   Chief Complaint  Patient presents with   Annual Exam    Steven Clayton. 43 y.o. male presents to office today for annual physical exam.  He has a past medical history of Anxiety, Depression, Schizoaffective disorder, and Stroke.   He is requesting labs to be ordered per his behavioral health provider.   Blood pressure is well controlled. He has been taking lisinopril 60 mg instead of 20 mg. States he was told my his behavioral health Provider to take 3 tablets of lisinopril. He is also clonidine.  BP Readings from Last 3 Encounters:  09/19/23 117/81  08/16/23 (!) 134/109  07/18/23 (!) 154/123     Review of Systems  Constitutional:  Negative for fever, malaise/fatigue and weight loss.  HENT: Negative.  Negative for nosebleeds.   Eyes: Negative.  Negative for blurred vision, double vision and photophobia.  Respiratory: Negative.  Negative for cough and shortness of breath.   Cardiovascular: Negative.  Negative for chest pain, palpitations and leg swelling.  Gastrointestinal: Negative.  Negative for heartburn, nausea and vomiting.  Genitourinary: Negative.   Musculoskeletal: Negative.  Negative for myalgias.  Skin: Negative.   Neurological: Negative.  Negative for dizziness, focal weakness, seizures and headaches.  Endo/Heme/Allergies: Negative.    Psychiatric/Behavioral: Negative.  Negative for suicidal ideas.     Past Medical History:  Diagnosis Date   Anxiety    Depression    Schizoaffective disorder (HCC)    Stroke [facility])     History reviewed. No pertinent surgical history.  Family History  Problem Relation Age of Onset   Heart disease Mother    Hyperlipidemia Mother    Mental illness Father     Social History Reviewed with no changes to be made today.   Outpatient Medications Prior to Visit  Medication Sig Dispense Refill   benztropine (COGENTIN) 1 MG tablet TAKE 1 TABLET(1 MG) BY MOUTH TWICE DAILY 180 tablet 0   cloNIDine (CATAPRES) 0.1 MG tablet TAKE 1 TABLET(0.1 MG) BY MOUTH DAILY 90 tablet 0   divalproex (DEPAKOTE ER) 500 MG 24 hr tablet Take 1 tablet (500 mg total) by mouth daily. 30 tablet 1   haloperidol (HALDOL) 10 MG tablet Take 1 tablet (10 mg total) by mouth 2 (two) times daily. 60 tablet 1   haloperidol decanoate (HALDOL DECANOATE) 100 MG/ML injection Inject 1 mL (100 mg total) into the muscle every 30 (thirty) days. 1 mL 11   hydrOXYzine (ATARAX) 25 MG tablet Take 1 tablet (25 mg total) by mouth 3 (three) times daily as needed for anxiety. 75 tablet 1   lisinopril (ZESTRIL) 20 MG tablet Take 1 tablet (20 mg total) by mouth daily. 90 tablet 1   traZODone (DESYREL) 100 MG tablet Take 1 tablet (100 mg total)  by mouth at bedtime as needed for sleep. 30 tablet 3   nicotine (NICODERM CQ - DOSED IN MG/24 HOURS) 14 mg/24hr patch Place 1 patch (14 mg total) onto the skin daily. (Patient not taking: Reported on 09/19/2023) 28 patch 0   Facility-Administered Medications Prior to Visit  Medication Dose Route Frequency Provider Last Rate Last Admin   haloperidol decanoate (HALDOL DECANOATE) 100 MG/ML injection 100 mg  100 mg Intramuscular Q30 days Nwoko, Uchenna E, PA   100 mg at 07/18/23 0925    No Known Allergies     Objective:    BP 117/81 (BP Location: Left Arm, Patient Position: Sitting, Cuff Size: Normal)    Pulse 80   Resp 20   Ht 5\' 11"  (1.803 m)   Wt 271 lb (122.9 kg)   SpO2 100%   BMI 37.80 kg/m  Wt Readings from Last 3 Encounters:  09/19/23 271 lb (122.9 kg)  08/16/23 276 lb (125.2 kg)  07/18/23 255 lb (115.7 kg)    Physical Exam Constitutional:      Appearance: He is well-developed.  HENT:     Head: Normocephalic and atraumatic.     Right Ear: Hearing, tympanic membrane, ear canal and external ear normal.     Left Ear: Hearing, tympanic membrane, ear canal and external ear normal.     Nose: Nose normal. No mucosal edema or rhinorrhea.     Right Turbinates: Not enlarged.     Left Turbinates: Not enlarged.     Mouth/Throat:     Clayton: Pink.     Mouth: Mucous membranes are moist.     Dentition: No gingival swelling, dental abscesses or gum lesions.     Pharynx: Uvula midline.     Tonsils: No tonsillar exudate. 1+ on the right. 1+ on the left.  Eyes:     General: Lids are normal. No scleral icterus.    Extraocular Movements: Extraocular movements intact.     Conjunctiva/sclera: Conjunctivae normal.     Pupils: Pupils are equal, round, and reactive to light.  Neck:     Thyroid: No thyromegaly.     Trachea: No tracheal deviation.  Cardiovascular:     Rate and Rhythm: Normal rate and regular rhythm.     Heart sounds: Normal heart sounds. No murmur heard.    No friction rub. No gallop.  Pulmonary:     Effort: Pulmonary effort is normal. No respiratory distress.     Breath sounds: Normal breath sounds. No wheezing or rales.  Chest:     Chest wall: No mass or tenderness.  Breasts:    Right: No inverted nipple, mass, nipple discharge, skin change or tenderness.     Left: No inverted nipple, mass, nipple discharge, skin change or tenderness.  Abdominal:     General: Bowel sounds are normal. There is no distension.     Palpations: Abdomen is soft. There is no mass.     Tenderness: There is no abdominal tenderness. There is no guarding or rebound.  Musculoskeletal:         General: No tenderness or deformity. Normal range of motion.     Cervical back: Normal range of motion and neck supple.  Lymphadenopathy:     Cervical: No cervical adenopathy.  Skin:    General: Skin is warm and dry.     Capillary Refill: Capillary refill takes less than 2 seconds.     Findings: No erythema.  Neurological:     Mental Status: He is alert and  oriented to person, place, and time.     Cranial Nerves: No cranial nerve deficit.     Sensory: Sensation is intact.     Motor: No abnormal muscle tone.     Coordination: Coordination is intact. Coordination normal.     Gait: Gait is intact.     Deep Tendon Reflexes: Reflexes normal.     Reflex Scores:      Patellar reflexes are 1+ on the right side and 1+ on the left side. Psychiatric:        Attention and Perception: Attention normal.        Mood and Affect: Mood normal.        Speech: Speech normal.        Behavior: Behavior normal.        Thought Content: Thought content normal.        Judgment: Judgment normal.          Patient has been counseled extensively about nutrition and exercise as well as the importance of adherence with medications and regular follow-up. The patient was given clear instructions to go to ER or return to medical center if symptoms don't improve, worsen or new problems develop. The patient verbalized understanding.   Follow-up: Return in about 4 weeks (around 10/17/2023) for BP recheck.   Claiborne Rigg, FNP-BC [facility], [place] [phone]   09/19/2023, 3:11 PM

## 2023-09-19 NOTE — Progress Notes (Signed)
BH MD/PA/NP OP Progress Note  Virtual Visit via Video Note  I connected with Steven Lips. on 09/18/23 at  2:00 PM EST by a video enabled telemedicine application and verified that I am speaking with the correct person using two identifiers.  Location: Patient: Home Provider: Clinic   I discussed the limitations of evaluation and management by telemedicine and the availability of in person appointments. The patient expressed understanding and agreed to proceed.  Follow Up Instructions:   I discussed the assessment and treatment plan with the patient. The patient was provided an opportunity to ask questions and all were answered. The patient agreed with the plan and demonstrated an understanding of the instructions.   The patient was advised to call back or seek an in-person evaluation if the symptoms worsen or if the condition fails to improve as anticipated.  I provided 19 minutes of non-face-to-face time during this encounter.  Meta Hatchet, PA    09/18/2023 10:06 PM Steven Lips.  MRN:  [id number]  Chief Complaint:  Chief Complaint  Patient presents with   Follow-up   Medication Refill   HPI:   Steven Canepa. is a 43 year old, African-American male with a past psychiatric history significant for schizoaffective disorder (bipolar type), anxiety, and insomnia who presents to [facility] via virtual video visit for follow-up and medication management. Patient was last seen by this provider on 02/02/2023. Patient is currently being managed on the following psychiatric medications:  Benztropine 1 mg 2 times daily Divalproex 500 mg 24-hour tablet daily Haloperidol 10 mg 2 times daily Haloperidol decanoate 100 mg/mL injection (first dose given on 11/18/2022) Hydroxyzine 25 mg 3 times daily as needed Trazodone 100 mg at bedtime  Patient presents today encounter stating that he feels a little under the weather.  Despite  being sick, patient reports that he continues to take his medications regularly.  Patient denies experiencing any adverse side effects from his current medication regimen.  Patient denies the need for dosage adjustments at this time.  Patient denies overt depressive symptoms but does continue to endorse anxiety.  Patient rates his anxiety a 7 out of 10.  Patient's main stressor revolves around his brothers funeral.  Patient denies auditory or visual hallucinations.  He endorses some paranoia at night attributed to living and are not so safe neighborhood.  Patient states that he is always worried about the potential for something bad to happen.  A PHQ-9 screen was performed with the patient scoring a 15.  A GAD-7 screen was also performed with the patient scoring a 15.  Patient is alert and oriented x 4, calm, cooperative, and fully engaged in conversation during the encounter.  Patient endorses okay mood.  Patient denies suicidal or homicidal ideations.  He further denies auditory or visual hallucinations and does not appear to be responding to internal/external stimuli.  Patient endorses fair sleep and receives on average 5 to 6 hours of sleep per night.  Patient endorses good appetite and eats on average 3 meals per day.  Patient denies alcohol consumption or illicit drug use.  Patient endorses tobacco use and smokes on average 3 cigarettes/day.  Visit Diagnosis:    ICD-10-CM   1. Insomnia due to other mental disorder  F51.05 traZODone (DESYREL) 100 MG tablet   F99     2. Schizoaffective disorder, bipolar type (HCC)  F25.0 divalproex (DEPAKOTE ER) 500 MG 24 hr tablet    haloperidol (HALDOL) 10 MG tablet  benztropine (COGENTIN) 1 MG tablet    3. Anxiety state  F41.1 hydrOXYzine (ATARAX) 25 MG tablet       Past Psychiatric History:  Patient has a past psychiatric history significant for the following: Schizoaffective disorder (bipolar type), anxiety, and insomnia  Patient has a past history  of hospitalization due to mental health and was recently hospitalized on 11/07/2022.   Past Medical History:  Past Medical History:  Diagnosis Date   Anxiety    Depression    Schizoaffective disorder (HCC)    Stroke [facility])    History reviewed. No pertinent surgical history.  Family Psychiatric History:  Patient denies any specific family history of psychiatric  illness. He does report that his father suffered traumatic brain injury while being in the [employer].   Mother - major depressive disorder  Family History:  Family History  Problem Relation Age of Onset   Heart disease Mother    Hyperlipidemia Mother    Mental illness Father     Social History:  Social History   Socioeconomic History   Marital status: Single    Spouse name: Not on file   Number of children: Not on file   Years of education: Not on file   Highest education level: Not on file  Occupational History   Not on file  Tobacco Use   Smoking status: Some Days    Current packs/day: 2.00    Average packs/day: 2.0 packs/day for 11.1 years (22.2 ttl pk-yrs)    Types: Cigarettes    Start date: 08/15/2012   Smokeless tobacco: Never  Vaping Use   Vaping status: Never Used  Substance and Sexual Activity   Alcohol use: Not Currently    Alcohol/week: 1.0 standard drink of alcohol    Types: 1 Glasses of wine per week   Drug use: No   Sexual activity: Never  Other Topics Concern   Not on file  Social History Narrative   Not on file   Social Drivers of Health   Financial Resource Strain: Low Risk  (07/17/2023)   Overall Financial Resource Strain (CARDIA)    Difficulty of Paying Living Expenses: Not hard at all  Food Insecurity: No Food Insecurity (07/17/2023)   Hunger Vital Sign    Worried About Running Out of Food in the Last Year: Never true    Ran Out of Food in the Last Year: Never true  Transportation Needs: No Transportation Needs (07/17/2023)   PRAPARE - Administrator, Civil Service  (Medical): No    Lack of Transportation (Non-Medical): No  Physical Activity: Inactive (07/17/2023)   Exercise Vital Sign    Days of Exercise per Week: 0 days    Minutes of Exercise per Session: 0 min  Stress: No Stress Concern Present (07/17/2023)   [name] of Occupational Health - Occupational Stress Questionnaire    Feeling of Stress : Not at all  Social Connections: Unknown (07/17/2023)   Social Connection and Isolation Panel [NHANES]    Frequency of Communication with Friends and Family: More than three times a week    Frequency of Social Gatherings with Friends and Family: More than three times a week    Attends Religious Services: Not on file    Active Member of Clubs or Organizations: Yes    Attends Banker Meetings: More than 4 times per year    Marital Status: Never married    Allergies: No Known Allergies  Metabolic Disorder Labs: Lab Results  Component Value  Date   HGBA1C 5.9 (H) 09/19/2023   MPG 111 11/07/2022   MPG 111 03/17/2021   Lab Results  Component Value Date   PROLACTIN 33.3 (H) 03/17/2021   PROLACTIN 26.5 (H) 01/20/2015   Lab Results  Component Value Date   CHOL 240 (H) 09/19/2023   TRIG 175 (H) 09/19/2023   HDL 44 09/19/2023   CHOLHDL 5.5 (H) 09/19/2023   VLDL 29 11/07/2022   LDLCALC 164 (H) 09/19/2023   LDLCALC 130 (H) 11/07/2022   Lab Results  Component Value Date   TSH 1.796 11/07/2022   TSH 1.898 03/17/2021    Therapeutic Level Labs: No results found for: "LITHIUM" Lab Results  Component Value Date   VALPROATE 57 09/19/2023   VALPROATE 46 (L) 11/20/2022   No results found for: "CBMZ"  Current Medications: Current Outpatient Medications  Medication Sig Dispense Refill   atorvastatin (LIPITOR) 10 MG tablet Take 1 tablet (10 mg total) by mouth daily. 90 tablet 3   benztropine (COGENTIN) 1 MG tablet TAKE 1 TABLET(1 MG) BY MOUTH TWICE DAILY 180 tablet 0   cloNIDine (CATAPRES) 0.1 MG tablet TAKE 1 TABLET(0.1  MG) BY MOUTH DAILY 90 tablet 0   divalproex (DEPAKOTE ER) 500 MG 24 hr tablet Take 1 tablet (500 mg total) by mouth daily. 90 tablet 0   haloperidol (HALDOL) 10 MG tablet Take 1 tablet (10 mg total) by mouth 2 (two) times daily. 180 tablet 0   haloperidol decanoate (HALDOL DECANOATE) 100 MG/ML injection Inject 1 mL (100 mg total) into the muscle every 30 (thirty) days. 1 mL 11   hydrOXYzine (ATARAX) 25 MG tablet Take 1 tablet (25 mg total) by mouth 3 (three) times daily as needed for anxiety. 75 tablet 1   lisinopril (ZESTRIL) 20 MG tablet Take 1 tablet (20 mg total) by mouth daily. 90 tablet 1   nicotine (NICODERM CQ - DOSED IN MG/24 HOURS) 14 mg/24hr patch Place 1 patch (14 mg total) onto the skin daily. (Patient not taking: Reported on 09/19/2023) 28 patch 0   traZODone (DESYREL) 100 MG tablet Take 1 tablet (100 mg total) by mouth at bedtime as needed for sleep. 90 tablet 0   Current Facility-Administered Medications  Medication Dose Route Frequency Provider Last Rate Last Admin   haloperidol decanoate (HALDOL DECANOATE) 100 MG/ML injection 100 mg  100 mg Intramuscular Q30 days Namish Krise E, PA   100 mg at 07/18/23 8295     Musculoskeletal: Strength & Muscle Tone: within normal limits Gait & Station: normal Patient leans: N/A  Psychiatric Specialty Exam: Review of Systems  Psychiatric/Behavioral:  Positive for sleep disturbance. Negative for decreased concentration, dysphoric mood, hallucinations, self-injury and suicidal ideas. The patient is nervous/anxious. The patient is not hyperactive.     There were no vitals taken for this visit.There is no height or weight on file to calculate BMI.  General Appearance: Casual  Eye Contact:  Good  Speech:  Clear and Coherent and Normal Rate  Volume:  Normal  Mood:  Anxious  Affect:  Appropriate  Thought Process:  Coherent, Goal Directed, and Descriptions of Associations: Intact  Orientation:  Full (Time, Place, and Person)  Thought  Content: WDL   Suicidal Thoughts:  No  Homicidal Thoughts:  No  Memory:  Immediate;   Good Recent;   Good Remote;   Good  Judgement:  Good  Insight:  Fair  Psychomotor Activity:  Normal  Concentration:  Concentration: Good and Attention Span: Good  Recall:  Good  Fund of Knowledge: Good  Language: Good  Akathisia:  No  Handed:  Right  AIMS (if indicated): not done  Assets:  Communication Skills Desire for Improvement Social Support  ADL's:  Intact  Cognition: WNL  Sleep:  Fair   Screenings: AIMS    Flowsheet Row [facility]) [facility]  AIMS Total Score 0 0      AUDIT    Flowsheet Row [facility]) [facility] ([facility]  Alcohol Use Disorder Identification Test Final Score (AUDIT) 0 2      GAD-7    Flowsheet Row Video Visit from 09/18/2023 in [facility] Office Visit from 07/18/2023 in [facility] Video Visit from 02/02/2023 in [facility] Office Visit from 12/05/2022 in [facility] Video Visit from 04/07/2022 in [facility]  Total GAD-7 Score 15 14 9 13 14       PHQ2-9    Flowsheet Row Video Visit from 09/18/2023 in [place] Office Visit from 07/18/2023 in [facility] Clinical Support from 07/17/2023 in [facility] Video Visit from 02/02/2023 in [facility] Office Visit from 12/05/2022 in [facility]  PHQ-2 Total Score 2 3 0 1 2  PHQ-9 Total Score 12 13 0 -- 13       Flowsheet Row Video Visit from 09/18/2023 in [facility] Office Visit from 07/18/2023 in [facility] Video Visit from 02/02/2023 in [facility]  C-SSRS RISK CATEGORY No Risk No Risk No Risk        Assessment and Plan:   Steven Leazer. is a 43 year old, African-American male with a past psychiatric history significant for schizoaffective disorder (bipolar type), anxiety, and insomnia who presents to [facility] via virtual video visit for follow-up and medication management.  Patient presents to the encounter feeling under the weather. Despite feeling sick, patient reports that he continues to take his medications regularly.  Patient denies overt depressive symptoms but does endorse some anxiety.  Despite his anxiety, patient endorses stability on his current medication regimen.  Patient's medications to be e-prescribed to pharmacy of choice.  The following labs were obtained from the patient: Valproic acid level, lipid panel, hemoglobin A1c, CMP, and CBC with differential.  Patient's valproic acid was within normal limits (57 micrograms/mL).  Lipid panel was significant for elevated cholesterol (240 mg/dL), elevated triglycerides (175 mg/dL), elevated LDL cholesterol (164 mg/dL), and elevated cholesterol/HDL ratio (5.5).  Patient's hemoglobin A1c was elevated (5.9%).  Complete metabolic panel was significant for elevated serum creatinine (1.53 mg/dL) and decreased EGFR (58 mL/minute/1.73).  Complete blood count significant for decreased hemoglobin (12.6 g/dL), decreased hematocrit (36.8%), and increased absolute lymphocyte count (3.2 x 10e3/uL).  Collaboration of Care: Collaboration of Care: Medication Management AEB provider managing patient's psychiatric medications, Primary Care Provider AEB patient was provided resources for primary care providers, and Psychiatrist  AEB patient is being followed by mental health provider at this facility  Patient/Guardian was advised Release of Information must be obtained prior to any record release in order to collaborate their care  with an outside provider. Patient/Guardian was advised if they have not already done so to contact the registration department to sign all necessary forms in order for [place] to release information regarding their care.   Consent: Patient/Guardian gives verbal consent for treatment and assignment of benefits for services provided during this visit. Patient/Guardian expressed understanding and agreed to proceed.   1. Schizoaffective disorder, bipolar type (HCC)  - divalproex (DEPAKOTE ER) 500 MG 24 hr tablet; Take 1 tablet (500 mg total) by mouth daily.  Dispense: 90 tablet; Refill: 0 - haloperidol (HALDOL) 10 MG tablet; Take 1 tablet (10 mg total) by mouth 2 (two) times daily.  Dispense: 180 tablet; Refill: 0 - benztropine (COGENTIN) 1 MG tablet; TAKE 1 TABLET(1 MG) BY MOUTH TWICE DAILY  Dispense: 180 tablet; Refill: 0  2. Anxiety state  - hydrOXYzine (ATARAX) 25 MG tablet; Take 1 tablet (25 mg total) by mouth 3 (three) times daily as needed for anxiety.  Dispense: 75 tablet; Refill: 1  3. Insomnia due to other mental disorder (Primary)  - traZODone (DESYREL) 100 MG tablet; Take 1 tablet (100 mg total) by mouth at bedtime as needed for sleep.  Dispense: 90 tablet; Refill: 0  Patient to follow up with [facility] clinic on 09/20/2023 for his long-acting injectable to be administered Patient to follow up in 2 months Provider spent a total of 19 minutes with the patient/reviewing patient's chart  Meta Hatchet, PA 09/18/2023, 10:06 PM

## 2023-09-20 ENCOUNTER — Other Ambulatory Visit: Payer: Self-pay | Admitting: Nurse Practitioner

## 2023-09-20 ENCOUNTER — Ambulatory Visit (HOSPITAL_COMMUNITY): Payer: 59

## 2023-09-20 ENCOUNTER — Other Ambulatory Visit: Payer: Self-pay

## 2023-09-20 ENCOUNTER — Encounter (HOSPITAL_COMMUNITY): Payer: Self-pay

## 2023-09-20 DIAGNOSIS — E7800 Pure hypercholesterolemia, unspecified: Secondary | ICD-10-CM

## 2023-09-20 LAB — CBC WITH DIFFERENTIAL/PLATELET
Basophils Absolute: 0 10*3/uL (ref 0.0–0.2)
Basos: 1 %
EOS (ABSOLUTE): 0.3 10*3/uL (ref 0.0–0.4)
Eos: 5 %
Hematocrit: 36.8 % — ABNORMAL LOW (ref 37.5–51.0)
Hemoglobin: 12.6 g/dL — ABNORMAL LOW (ref 13.0–17.7)
Immature Grans (Abs): 0 10*3/uL (ref 0.0–0.1)
Immature Granulocytes: 0 %
Lymphocytes Absolute: 3.2 10*3/uL — ABNORMAL HIGH (ref 0.7–3.1)
Lymphs: 42 %
MCH: 29.9 pg (ref 26.6–33.0)
MCHC: 34.2 g/dL (ref 31.5–35.7)
MCV: 87 fL (ref 79–97)
Monocytes Absolute: 0.6 10*3/uL (ref 0.1–0.9)
Monocytes: 8 %
Neutrophils Absolute: 3.3 10*3/uL (ref 1.4–7.0)
Neutrophils: 44 %
Platelets: 269 10*3/uL (ref 150–450)
RBC: 4.21 x10E6/uL (ref 4.14–5.80)
RDW: 14.7 % (ref 11.6–15.4)
WBC: 7.5 10*3/uL (ref 3.4–10.8)

## 2023-09-20 LAB — CMP14+EGFR
ALT: 12 [IU]/L (ref 0–44)
AST: 17 [IU]/L (ref 0–40)
Albumin: 4.5 g/dL (ref 4.1–5.1)
Alkaline Phosphatase: 83 [IU]/L (ref 44–121)
BUN/Creatinine Ratio: 10 (ref 9–20)
BUN: 16 mg/dL (ref 6–24)
Bilirubin Total: 0.2 mg/dL (ref 0.0–1.2)
CO2: 25 mmol/L (ref 20–29)
Calcium: 10.2 mg/dL (ref 8.7–10.2)
Chloride: 100 mmol/L (ref 96–106)
Creatinine, Ser: 1.53 mg/dL — ABNORMAL HIGH (ref 0.76–1.27)
Globulin, Total: 2.6 g/dL (ref 1.5–4.5)
Glucose: 81 mg/dL (ref 70–99)
Potassium: 4.4 mmol/L (ref 3.5–5.2)
Sodium: 141 mmol/L (ref 134–144)
Total Protein: 7.1 g/dL (ref 6.0–8.5)
eGFR: 58 mL/min/{1.73_m2} — ABNORMAL LOW (ref >59)

## 2023-09-20 LAB — LIPID PANEL
Chol/HDL Ratio: 5.5 {ratio} — ABNORMAL HIGH (ref 0.0–5.0)
Cholesterol, Total: 240 mg/dL — ABNORMAL HIGH (ref 100–199)
HDL: 44 mg/dL (ref >39)
LDL Chol Calc (NIH): 164 mg/dL — ABNORMAL HIGH (ref 0–99)
Triglycerides: 175 mg/dL — ABNORMAL HIGH (ref 0–149)
VLDL Cholesterol Cal: 32 mg/dL (ref 5–40)

## 2023-09-20 LAB — HEMOGLOBIN A1C
Est. average glucose Bld gHb Est-mCnc: 123 mg/dL
Hgb A1c MFr Bld: 5.9 % — ABNORMAL HIGH (ref 4.8–5.6)

## 2023-09-20 LAB — VALPROIC ACID LEVEL: Valproic Acid Lvl: 57 ug/mL (ref 50–100)

## 2023-09-20 MED ORDER — ATORVASTATIN CALCIUM 10 MG PO TABS
10.0000 mg | ORAL_TABLET | Freq: Every day | ORAL | 3 refills | Status: DC
  Filled 2023-09-20: qty 90, 90d supply, fill #0

## 2023-09-22 ENCOUNTER — Other Ambulatory Visit: Payer: Self-pay | Admitting: Nurse Practitioner

## 2023-09-22 DIAGNOSIS — E7800 Pure hypercholesterolemia, unspecified: Secondary | ICD-10-CM

## 2023-09-23 ENCOUNTER — Other Ambulatory Visit (HOSPITAL_COMMUNITY): Payer: Self-pay | Admitting: Physician Assistant

## 2023-09-23 DIAGNOSIS — I10 Essential (primary) hypertension: Secondary | ICD-10-CM

## 2023-09-23 DIAGNOSIS — F5105 Insomnia due to other mental disorder: Secondary | ICD-10-CM

## 2023-09-23 DIAGNOSIS — F250 Schizoaffective disorder, bipolar type: Secondary | ICD-10-CM

## 2023-09-23 DIAGNOSIS — F411 Generalized anxiety disorder: Secondary | ICD-10-CM

## 2023-09-24 ENCOUNTER — Other Ambulatory Visit: Payer: Self-pay

## 2023-09-24 NOTE — Telephone Encounter (Signed)
Called patient to verify which pharmacy he wants medication refills from . Patient reports he would like to keep lipitor refills at [facility] and will pick up refill today. Patient would like to continue Nicotine 14 mg patches to be refilled from [facility].

## 2023-09-24 NOTE — Telephone Encounter (Signed)
Called [facility] pharmacy to confirm if patient picked up Rx. Pharmacy staff reports refill ready for pick up .

## 2023-09-24 NOTE — Telephone Encounter (Signed)
Requested medication (s) are due for refill today: yes   Requested medication (s) are on the active medication list: yes   Last refill:  11/21/22 #28 0 refills  Future visit scheduled: yes in 3 weeks   Notes to clinic:  last ordered by Starleen Blue, NP 11/21/22. Do you want to order Rx?     Requested Prescriptions  Pending Prescriptions Disp Refills   nicotine (NICODERM CQ - DOSED IN MG/24 HOURS) 14 mg/24hr patch [Pharmacy Med Name: NICOTINE 14MG  PATCH 14CT 14MG /24HR PATC]  10    Sig: APPLY 1 PATCH TO SKIN DAILY *NEW PRESCRIPTION REQUEST*     Psychiatry:  Drug Dependence Therapy Passed - 09/24/2023 10:53 AM      Passed - Valid encounter within last 12 months    Recent Outpatient Visits           5 days ago Encounter for annual physical exam   [facility] [place]. [facility] Claiborne Rigg, NP   3 months ago Encounter to establish care   [facility] [place] [facility] [place]. [facility] Claiborne Rigg, NP   5 years ago Essential hypertension   [facility], Marolyn Hammock, PA-C       Future Appointments             In 3 weeks Claiborne Rigg, NP [facility] Of [place] [facility]. [facility]            Refused Prescriptions Disp Refills   atorvastatin (LIPITOR) 10 MG tablet [Pharmacy Med Name: ATORVASTATIN 10MG  TAB 10 Tablet] 30 tablet 10    Sig: TAKE ONE TABLET BY MOUTH ONCE DAILY *NEW PRESCRIPTION REQUEST*     Cardiovascular:  Antilipid - Statins Failed - 09/24/2023 10:53 AM      Failed - Lipid Panel in normal range within the last 12 months    Cholesterol, Total  Date Value Ref Range Status  09/19/2023 240 (H) 100 - 199 mg/dL Final   LDL Chol Calc (NIH)  Date Value Ref Range Status  09/19/2023 164 (H) 0 - 99 mg/dL Final   HDL  Date Value Ref Range Status  09/19/2023 44 >39 mg/dL Final   Triglycerides  Date Value Ref Range Status  09/19/2023 175  (H) 0 - 149 mg/dL Final         Passed - Patient is not pregnant      Passed - Valid encounter within last 12 months    Recent Outpatient Visits           5 days ago Encounter for annual physical exam   [place] [facility] [place]. [facility] Claiborne Rigg, NP   3 months ago Encounter to establish care   [place] [facility] [place] [facility] Dept [facility] [place]. [facility] Claiborne Rigg, NP   5 years ago Essential hypertension   [facility], Marolyn Hammock, PA-C       Future Appointments             In 3 weeks Claiborne Rigg, NP [facility] Dept [facility] [place]. [facility]

## 2023-09-24 NOTE — Telephone Encounter (Signed)
Requested by interface surescripts. Medication already sent to different pharmacy and patient requesting to keep refills from [facility].  Requested Prescriptions  Pending Prescriptions Disp Refills   nicotine (NICODERM CQ - DOSED IN MG/24 HOURS) 14 mg/24hr patch [Pharmacy Med Name: NICOTINE 14MG  PATCH 14CT 14MG /24HR PATC]  10    Sig: APPLY 1 PATCH TO SKIN DAILY *NEW PRESCRIPTION REQUEST*     Psychiatry:  Drug Dependence Therapy Passed - 09/24/2023 10:52 AM      Passed - Valid encounter within last 12 months    Recent Outpatient Visits           5 days ago Encounter for annual physical exam   [place] [facility] [place] [facility] [place]. [facility] Claiborne Rigg, NP   3 months ago Encounter to establish care   [place] [facility] [place] - [facility] Dept Of [place]. [facility] Claiborne Rigg, NP   5 years ago Essential hypertension   [facility], Marolyn Hammock, PA-C       Future Appointments             In 3 weeks Claiborne Rigg, NP [facility] - [facility] Dept [facility] [place] [facility]. [facility]            Refused Prescriptions Disp Refills   atorvastatin (LIPITOR) 10 MG tablet [Pharmacy Med Name: ATORVASTATIN 10MG  TAB 10 Tablet] 30 tablet 10    Sig: TAKE ONE TABLET BY MOUTH ONCE DAILY *NEW PRESCRIPTION REQUEST*     Cardiovascular:  Antilipid - Statins Failed - 09/24/2023 10:52 AM      Failed - Lipid Panel in normal range within the last 12 months    Cholesterol, Total  Date Value Ref Range Status  09/19/2023 240 (H) 100 - 199 mg/dL Final   LDL Chol Calc (NIH)  Date Value Ref Range Status  09/19/2023 164 (H) 0 - 99 mg/dL Final   HDL  Date Value Ref Range Status  09/19/2023 44 >39 mg/dL Final   Triglycerides  Date Value Ref Range Status  09/19/2023 175 (H) 0 - 149 mg/dL Final         Passed - Patient is not pregnant      Passed - Valid encounter within last 12 months    Recent Outpatient  Visits           5 days ago Encounter for annual physical exam   [place] [facility] [place] [facility] Dept [facility] [place]. [facility] Claiborne Rigg, NP   3 months ago Encounter to establish care   [place] [facility] [place] - [facility] Dept [facility] [place]. [facility] Claiborne Rigg, NP   5 years ago Essential hypertension   [facility], Marolyn Hammock, PA-C       Future Appointments             In 3 weeks Claiborne Rigg, NP [facility] Dept Of [place] [facility]. [facility]

## 2023-09-27 ENCOUNTER — Other Ambulatory Visit: Payer: Self-pay

## 2023-09-27 ENCOUNTER — Other Ambulatory Visit: Payer: Self-pay | Admitting: Nurse Practitioner

## 2023-09-27 DIAGNOSIS — I10 Essential (primary) hypertension: Secondary | ICD-10-CM

## 2023-09-27 NOTE — Telephone Encounter (Signed)
 Last Fill: 06/05/23  Last OV: 09/19/23 Next OV: 10/17/23  Routing to provider for review/authorization.

## 2023-09-27 NOTE — Telephone Encounter (Signed)
 Copied from CRM [id number]. Topic: Clinical - Medication Refill >> Sep 27, 2023  4:42 PM Renea ORN wrote: Most Recent Primary Care Visit:  Provider: THEOTIS OHM W  Department: [facility]  Visit Type: PHYSICAL  Date: 09/19/2023  Medication: lisinopril  (ZESTRIL ) 20 MG tablet   Has the patient contacted their pharmacy? Yes (Agent: If no, request that the patient contact the pharmacy for the refill. If patient does not wish to contact the pharmacy document the reason why and proceed with request.) (Agent: If yes, when and what did the pharmacy advise?)  Is this the correct pharmacy for this prescription? Yes If no, delete pharmacy and type the correct one.  This is the patient's preferred pharmacy:    [facility], [place], [place] - [address] [place] [address] Phone: [phone] Fax: [phone]   Has the prescription been filled recently? Yes  Is the patient out of the medication? Yes  Has the patient been seen for an appointment in the last year OR does the patient have an upcoming appointment? Yes  Can we respond through MyChart? No  Agent: Please be advised that Rx refills may take up to 3 business days. We ask that you follow-up with your pharmacy.

## 2023-09-28 ENCOUNTER — Telehealth (HOSPITAL_COMMUNITY): Payer: Self-pay

## 2023-09-28 NOTE — Telephone Encounter (Signed)
 Medication management - Message left for patient requesting he call our office back to get scheduled for missed and past due Haldol  Dec. injection.

## 2023-10-01 ENCOUNTER — Other Ambulatory Visit: Payer: Self-pay

## 2023-10-02 ENCOUNTER — Ambulatory Visit (INDEPENDENT_AMBULATORY_CARE_PROVIDER_SITE_OTHER): Payer: 59

## 2023-10-02 ENCOUNTER — Other Ambulatory Visit: Payer: Self-pay | Admitting: Nurse Practitioner

## 2023-10-02 VITALS — BP 135/98 | HR 88 | Ht 70.0 in | Wt 270.0 lb

## 2023-10-02 DIAGNOSIS — F250 Schizoaffective disorder, bipolar type: Secondary | ICD-10-CM | POA: Diagnosis not present

## 2023-10-02 DIAGNOSIS — I10 Essential (primary) hypertension: Secondary | ICD-10-CM

## 2023-10-02 NOTE — Telephone Encounter (Unsigned)
Copied from CRM [id number]. Topic: Clinical - Medication Refill >> Oct 02, 2023  9:09 AM Louie Casa B wrote: Most Recent Primary Care Visit:  Provider: Bertram Denver W  Department[facility]  Visit Type: PHYSICAL  Date: 09/19/2023  Medication: lisinopril (ZESTRIL) 20 MG tablet  Has the patient contacted their pharmacy? Yes (Agent: If no, request that the patient contact the pharmacy for the refill. If patient does not wish to contact the pharmacy document the reason why and proceed with request.) (Agent: If yes, when and what did the pharmacy advise?)  Is this the correct pharmacy for this prescription? Yes If no, delete pharmacy and type the correct one.  This is the patient's preferred pharmacy:     [facility], [place] - [address] [place] [address] Phone: [phone] Fax: [phone]   Has the prescription been filled recently? Yes  Is the patient out of the medication? Yes  Has the patient been seen for an appointment in the last year OR does the patient have an upcoming appointment? Yes  Can we respond through MyChart? No  Agent: Please be advised that Rx refills may take up to 3 business days. We ask that you follow-up with your pharmacy.

## 2023-10-02 NOTE — Progress Notes (Cosign Needed)
Patient presents today for injection of haldol 100 mg/ 1 ML administered in right deltoid. Pt states he has no complaints regarding injection. Patient is in good spirits and denies any AVH, SI, and HI. Pt states he is feeling good with dosage and says he does experiences some "tinglenesses" at night sometimes but doesn't believe its due to medications.

## 2023-10-10 ENCOUNTER — Telehealth: Payer: Self-pay | Admitting: *Deleted

## 2023-10-10 NOTE — Telephone Encounter (Signed)
 Copied from CRM [id number]. Topic: General - Other >> Oct 10, 2023 11:54 AM Eunice Blase wrote: Reason for CRM: Pt wants to confirm that haloperidol decanoate (HALDOL DECANOATE) 100 MG/ML injection will be provided for pt at scheduled appt on 10/17/2023. Please call pt at [phone].

## 2023-10-11 ENCOUNTER — Telehealth (HOSPITAL_COMMUNITY): Payer: Self-pay

## 2023-10-11 NOTE — Telephone Encounter (Signed)
 Hello,   Pt states that he is unable to pick up medicine saying that he will have to cancel appointment. "Says medication has stopped, says the medication is out of stock and that he will not be able to make the appointment until he speaks to Dr. Link Snuffer". Tired to call patient to get details no response from Pt.    Pt states that he is now with [facility] which does deliveries for medication .   Please give Pt call back when free.

## 2023-10-11 NOTE — Telephone Encounter (Signed)
 Unable to reach patient by phone to relay response.  Mother of patient will inform Mr. Crehan to return call to our office.

## 2023-10-11 NOTE — Telephone Encounter (Signed)
 No it will not. He needs to discuss this with behavioral health

## 2023-10-17 ENCOUNTER — Ambulatory Visit: Payer: 59 | Admitting: Nurse Practitioner

## 2023-10-31 ENCOUNTER — Ambulatory Visit (HOSPITAL_COMMUNITY): Payer: 59

## 2023-11-01 ENCOUNTER — Other Ambulatory Visit (HOSPITAL_COMMUNITY): Payer: Self-pay | Admitting: Physician Assistant

## 2023-11-01 DIAGNOSIS — I10 Essential (primary) hypertension: Secondary | ICD-10-CM

## 2023-11-14 ENCOUNTER — Telehealth (HOSPITAL_COMMUNITY): Payer: 59 | Admitting: Physician Assistant

## 2023-11-14 ENCOUNTER — Encounter (HOSPITAL_COMMUNITY): Payer: Self-pay | Admitting: Physician Assistant

## 2023-11-14 DIAGNOSIS — F5105 Insomnia due to other mental disorder: Secondary | ICD-10-CM

## 2023-11-14 DIAGNOSIS — F411 Generalized anxiety disorder: Secondary | ICD-10-CM

## 2023-11-14 DIAGNOSIS — F250 Schizoaffective disorder, bipolar type: Secondary | ICD-10-CM | POA: Diagnosis not present

## 2023-11-14 MED ORDER — HYDROXYZINE HCL 25 MG PO TABS: 25.0000 mg | ORAL_TABLET | Freq: Three times a day (TID) | ORAL | 1 refills | Status: DC | PRN

## 2023-11-14 MED ORDER — HALOPERIDOL 10 MG PO TABS: 10.0000 mg | ORAL_TABLET | Freq: Two times a day (BID) | ORAL | 0 refills | Status: DC

## 2023-11-14 MED ORDER — TRAZODONE HCL 100 MG PO TABS: 100.0000 mg | ORAL_TABLET | Freq: Every evening | ORAL | 0 refills | Status: DC | PRN

## 2023-11-14 MED ORDER — DIVALPROEX SODIUM ER 500 MG PO TB24: 500.0000 mg | ORAL_TABLET | Freq: Every day | ORAL | 0 refills | Status: DC

## 2023-11-14 MED ORDER — BENZTROPINE MESYLATE 1 MG PO TABS
ORAL_TABLET | ORAL | 0 refills | Status: DC
Start: 2023-11-14 — End: 2024-02-25

## 2023-11-14 NOTE — Progress Notes (Signed)
 BH MD/PA/NP OP Progress Note  Virtual Visit via Video Note  I connected with Steven Lips. on 11/14/23 at  1:00 PM EDT by a video enabled telemedicine application and verified that I am speaking with the correct person using two identifiers.  Location: Patient: Home Provider: Clinic   I discussed the limitations of evaluation and management by telemedicine and the availability of in person appointments. The patient expressed understanding and agreed to proceed.  Follow Up Instructions:   I discussed the assessment and treatment plan with the patient. The patient was provided an opportunity to ask questions and all were answered. The patient agreed with the plan and demonstrated an understanding of the instructions.   The patient was advised to call back or seek an in-person evaluation if the symptoms worsen or if the condition fails to improve as anticipated.  I provided 17 minutes of non-face-to-face time during this encounter.  Meta Hatchet, PA    11/14/2023 7:15 PM Steven Lips.  MRN:  [id number]  Chief Complaint:  Chief Complaint  Patient presents with   Follow-up   Medication Refill   HPI:   Steven Raden. is a 43 year old, African-American male with a past psychiatric history significant for schizoaffective disorder (bipolar type), anxiety, and insomnia who presents to [facility] via virtual video visit for follow-up and medication management.  Patient is currently being managed on the following psychiatric medications:  Benztropine 1 mg 2 times daily Divalproex 500 mg 24-hour tablet daily Haloperidol 10 mg 2 times daily Haloperidol decanoate 100 mg/mL injection (first dose given on 11/18/2022) Hydroxyzine 25 mg 3 times daily as needed Trazodone 100 mg at bedtime  Patient reports no issues or concerns with her current medication regimen.  Patient endorses some drowsiness when taking his medication but denies  any other issues.  Patient denies overt depression nor does he endorse anxiety.  He further denies auditory or visual hallucinations or paranoia.  Patient denies any other issues or concerns at this time.  A PHQ-9 screen was performed with the patient scoring a 10.  A GAD-7 screen was also performed with the patient scoring a 16.  Patient is alert and oriented x 4, calm, cooperative, and fully engaged in conversation during the encounter.  Patient endorses feeling sad over the death and his family.  Patient exhibits depressed mood with appropriate affect.  Patient denies suicidal or homicidal ideations.  He further denies auditory or visual hallucinations and does not appear to be responding to internal/external stimuli.  Patient endorses good sleep.  Patient endorses fair appetite and eats on average 1-2 meals per day.  Patient denies alcohol consumption or illicit drug use.  Patient endorses tobacco use and smokes on average 3 cigarettes/day.  Visit Diagnosis:    ICD-10-CM   1. Schizoaffective disorder, bipolar type (HCC)  F25.0 divalproex (DEPAKOTE ER) 500 MG 24 hr tablet    haloperidol (HALDOL) 10 MG tablet    benztropine (COGENTIN) 1 MG tablet    2. Anxiety state  F41.1 hydrOXYzine (ATARAX) 25 MG tablet    3. Insomnia due to other mental disorder  F51.05 traZODone (DESYREL) 100 MG tablet   F99       Past Psychiatric History:  Patient has a past psychiatric history significant for the following: Schizoaffective disorder (bipolar type), anxiety, and insomnia  Patient has a past history of hospitalization due to mental health and was recently hospitalized on 11/07/2022.   Past Medical History:  Past Medical  History:  Diagnosis Date   Anxiety    Depression    Schizoaffective disorder (HCC)    Stroke [facility])    History reviewed. No pertinent surgical history.  Family Psychiatric History:  Patient denies any specific family history of psychiatric  illness. He does report that his father  suffered traumatic brain injury while being in the [employer].   Mother - major depressive disorder  Family History:  Family History  Problem Relation Age of Onset   Heart disease Mother    Hyperlipidemia Mother    Mental illness Father     Social History:  Social History   Socioeconomic History   Marital status: Single    Spouse name: Not on file   Number of children: Not on file   Years of education: Not on file   Highest education level: Not on file  Occupational History   Not on file  Tobacco Use   Smoking status: Some Days    Current packs/day: 2.00    Average packs/day: 2.0 packs/day for 11.3 years (22.5 ttl pk-yrs)    Types: Cigarettes    Start date: 08/15/2012   Smokeless tobacco: Never  Vaping Use   Vaping status: Never Used  Substance and Sexual Activity   Alcohol use: Not Currently    Alcohol/week: 1.0 standard drink of alcohol    Types: 1 Glasses of wine per week   Drug use: No   Sexual activity: Never  Other Topics Concern   Not on file  Social History Narrative   Not on file   Social Drivers of Health   Financial Resource Strain: Low Risk  (07/17/2023)   Overall Financial Resource Strain (CARDIA)    Difficulty of Paying Living Expenses: Not hard at all  Food Insecurity: No Food Insecurity (07/17/2023)   Hunger Vital Sign    Worried About Running Out of Food in the Last Year: Never true    Ran Out of Food in the Last Year: Never true  Transportation Needs: No Transportation Needs (07/17/2023)   PRAPARE - Administrator, Civil Service (Medical): No    Lack of Transportation (Non-Medical): No  Physical Activity: Inactive (07/17/2023)   Exercise Vital Sign    Days of Exercise per Week: 0 days    Minutes of Exercise per Session: 0 min  Stress: No Stress Concern Present (07/17/2023)   [name] of Occupational Health - Occupational Stress Questionnaire    Feeling of Stress : Not at all  Social Connections: Unknown (07/17/2023)    Social Connection and Isolation Panel [NHANES]    Frequency of Communication with Friends and Family: More than three times a week    Frequency of Social Gatherings with Friends and Family: More than three times a week    Attends Religious Services: Not on file    Active Member of Clubs or Organizations: Yes    Attends Banker Meetings: More than 4 times per year    Marital Status: Never married    Allergies: No Known Allergies  Metabolic Disorder Labs: Lab Results  Component Value Date   HGBA1C 5.9 (H) 09/19/2023   MPG 111 11/07/2022   MPG 111 03/17/2021   Lab Results  Component Value Date   PROLACTIN 33.3 (H) 03/17/2021   PROLACTIN 26.5 (H) 01/20/2015   Lab Results  Component Value Date   CHOL 240 (H) 09/19/2023   TRIG 175 (H) 09/19/2023   HDL 44 09/19/2023   CHOLHDL 5.5 (H) 09/19/2023   VLDL  29 11/07/2022   LDLCALC 164 (H) 09/19/2023   LDLCALC 130 (H) 11/07/2022   Lab Results  Component Value Date   TSH 1.796 11/07/2022   TSH 1.898 03/17/2021    Therapeutic Level Labs: No results found for: "LITHIUM" Lab Results  Component Value Date   VALPROATE 57 09/19/2023   VALPROATE 46 (L) 11/20/2022   No results found for: "CBMZ"  Current Medications: Current Outpatient Medications  Medication Sig Dispense Refill   atorvastatin (LIPITOR) 10 MG tablet Take 1 tablet (10 mg total) by mouth daily. 90 tablet 3   benztropine (COGENTIN) 1 MG tablet TAKE 1 TABLET(1 MG) BY MOUTH TWICE DAILY 180 tablet 0   cloNIDine (CATAPRES) 0.1 MG tablet TAKE 1 TABLET(0.1 MG) BY MOUTH DAILY 90 tablet 0   divalproex (DEPAKOTE ER) 500 MG 24 hr tablet Take 1 tablet (500 mg total) by mouth daily. 90 tablet 0   haloperidol (HALDOL) 10 MG tablet Take 1 tablet (10 mg total) by mouth 2 (two) times daily. 180 tablet 0   haloperidol decanoate (HALDOL DECANOATE) 100 MG/ML injection Inject 1 mL (100 mg total) into the muscle every 30 (thirty) days. 1 mL 11   hydrOXYzine (ATARAX) 25 MG  tablet Take 1 tablet (25 mg total) by mouth 3 (three) times daily as needed for anxiety. 75 tablet 1   lisinopril (ZESTRIL) 20 MG tablet Take 1 tablet (20 mg total) by mouth daily. 90 tablet 1   nicotine (NICODERM CQ - DOSED IN MG/24 HOURS) 14 mg/24hr patch APPLY 1 PATCH TO SKIN DAILY *NEW PRESCRIPTION REQUEST* 28 patch 1   traZODone (DESYREL) 100 MG tablet Take 1 tablet (100 mg total) by mouth at bedtime as needed for sleep. 90 tablet 0   No current facility-administered medications for this visit.     Musculoskeletal: Strength & Muscle Tone: within normal limits Gait & Station: normal Patient leans: N/A  Psychiatric Specialty Exam: Review of Systems  Psychiatric/Behavioral:  Negative for decreased concentration, dysphoric mood, hallucinations, self-injury, sleep disturbance and suicidal ideas. The patient is not nervous/anxious and is not hyperactive.     There were no vitals taken for this visit.There is no height or weight on file to calculate BMI.  General Appearance: Casual  Eye Contact:  Good  Speech:  Clear and Coherent and Normal Rate  Volume:  Normal  Mood:  Euthymic  Affect:  Appropriate  Thought Process:  Coherent, Goal Directed, and Descriptions of Associations: Intact  Orientation:  Full (Time, Place, and Person)  Thought Content: WDL   Suicidal Thoughts:  No  Homicidal Thoughts:  No  Memory:  Immediate;   Good Recent;   Good Remote;   Good  Judgement:  Good  Insight:  Fair  Psychomotor Activity:  Normal  Concentration:  Concentration: Good and Attention Span: Good  Recall:  Good  Fund of Knowledge: Good  Language: Good  Akathisia:  No  Handed:  Right  AIMS (if indicated): not done  Assets:  Communication Skills Desire for Improvement Social Support  ADL's:  Intact  Cognition: WNL  Sleep:  Good   Screenings: AIMS    Flowsheet Row [facility] (Dis[facility]  AIMS Total Score 0 0      AUDIT    Flowsheet Row [facility]) [facility] Ad[facility] ([facility]  Alcohol  Use Disorder Identification Test Final Score (AUDIT) 0 2      GAD-7    Flowsheet Row Video Visit from 11/14/2023 in [facility] Office Visit from 09/19/20[facility] in [facility] [place] - [facility] Dept Of [place]. [facility] Video Visit from 09/18/2023 in [facility] Office Visit from 07/18/2023 in [facility] Video Visit from 02/02/2023 in [facility]  Total GAD-7 Score 16 15 15 14 9       PHQ2-9    Flowsheet Row Video Visit from 11/14/2023 in [facility] Office Visit from 09/19/2023 in [facility] [place] - A Dept Of [place]. [facility] Video Visit from 09/18/2023 in [facility] Office Visit from 07/18/2023 in [facility] Clinical Support from 07/17/2023 in [facility] [place] - A Dept Of [place]. [facility]  PHQ-2 Total Score 2 2 2 3  0  PHQ-9 Total Score 10 15 12 13  0      Flowsheet Row Video Visit from 11/14/2023 in [facility] Video Visit from 09/18/2023 in [facility] Office Visit from 07/18/2023 in [facility]  C-SSRS RISK CATEGORY Low Risk No Risk No Risk        Assessment and Plan:   Steven Holt. is a 43 year old, African-American male with a past psychiatric history significant for schizoaffective disorder (bipolar type), anxiety, and insomnia who presents to [facility] via virtual video visit for  follow-up and medication management.  Patient presents to the encounter stating that he takes his medications regularly.  The only issue that the patient reports regarding his medication regimen that he experiences some drowsiness.  Patient denies experiencing any overt depressive symptoms nor does he endorse anxiety.  Patient does not appear to be responding to internal/external stimuli.  Patient endorses stability on his current medication regimen and would like to continue taking his medications regularly.  Patient's medications to be e-prescribed to pharmacy of choice.  Collaboration of Care: Collaboration of Care: Medication Management AEB provider managing patient's psychiatric medications, Primary Care Provider AEB patient was provided resources for primary care providers, and Psychiatrist AEB patient is being followed by mental health provider at this facility  Patient/Guardian was advised Release of Information must be obtained prior to any record release in order to collaborate their care with an outside provider. Patient/Guardian was advised if they have not already done so to contact the registration department to sign all necessary forms in order for [place] to release information regarding their care.   Consent: Patient/Guardian gives verbal consent for treatment and assignment of benefits for services provided during this visit. Patient/Guardian expressed understanding and agreed to proceed.   1. Schizoaffective disorder, bipolar type (HCC) Patient to continue receiving haloperidol decanoate 100 mg/mL injection every 30 days for the management of his schizoaffective disorder  - divalproex (DEPAKOTE ER) 500 MG 24 hr tablet; Take 1 tablet (500 mg total) by mouth daily.  Dispense: 90 tablet; Refill: 0 - haloperidol (HALDOL) 10 MG tablet; Take 1 tablet (10 mg total) by mouth 2 (two) times daily.  Dispense: 180 tablet; Refill: 0 - benztropine (COGENTIN) 1 MG tablet; TAKE 1 TABLET(1 MG) BY MOUTH  TWICE DAILY  Dispense: 180 tablet; Refill: 0  2. Anxiety state  - hydrOXYzine (ATARAX) 25 MG tablet; Take 1 tablet (  25 mg total) by mouth 3 (three) times daily as needed for anxiety.  Dispense: 75 tablet; Refill: 1  3. Insomnia due to other mental disorder  - traZODone (DESYREL) 100 MG tablet; Take 1 tablet (100 mg total) by mouth at bedtime as needed for sleep.  Dispense: 90 tablet; Refill: 0  Patient to follow up with [facility] clinic on 11/15/2023 for his long-acting injectable to be administered Patient to follow up in 2 months Provider spent a total of 17 minutes with the patient/reviewing patient's chart  Meta Hatchet, PA 11/14/2023, 7:15 PM

## 2023-11-15 ENCOUNTER — Ambulatory Visit (INDEPENDENT_AMBULATORY_CARE_PROVIDER_SITE_OTHER)

## 2023-11-15 VITALS — BP 121/90 | HR 90 | Ht 70.0 in | Wt 284.0 lb

## 2023-11-15 DIAGNOSIS — F250 Schizoaffective disorder, bipolar type: Secondary | ICD-10-CM | POA: Diagnosis not present

## 2023-11-15 NOTE — Progress Notes (Cosign Needed)
 Patient presents today for injection of haldol 100 mg/ 1 ML administered in left deltoid. Pt states he has no complaints regarding injection.   JNL, CMA  Patient is in good spirits and denies any AVH, SI, and HI. Pt states he is feeling good with dosage and says he does experiences some "tinglenesses" at night sometimes but doesn't believe its due to medications.

## 2023-11-20 ENCOUNTER — Other Ambulatory Visit (HOSPITAL_COMMUNITY): Payer: Self-pay | Admitting: Physician Assistant

## 2023-11-20 DIAGNOSIS — F250 Schizoaffective disorder, bipolar type: Secondary | ICD-10-CM

## 2023-11-21 ENCOUNTER — Other Ambulatory Visit (HOSPITAL_COMMUNITY): Payer: Self-pay | Admitting: Physician Assistant

## 2023-11-21 DIAGNOSIS — F250 Schizoaffective disorder, bipolar type: Secondary | ICD-10-CM

## 2023-11-21 MED ORDER — HALOPERIDOL DECANOATE 100 MG/ML IM SOLN: 100.0000 mg | INTRAMUSCULAR | 11 refills | Status: AC

## 2023-11-21 NOTE — Telephone Encounter (Signed)
 Message acknowledged and reviewed.

## 2023-11-21 NOTE — Progress Notes (Signed)
 Medication being sent to appropriate pharmacy.

## 2023-11-23 ENCOUNTER — Other Ambulatory Visit (HOSPITAL_COMMUNITY): Payer: Self-pay | Admitting: Physician Assistant

## 2023-11-23 DIAGNOSIS — I10 Essential (primary) hypertension: Secondary | ICD-10-CM

## 2023-12-13 ENCOUNTER — Ambulatory Visit (HOSPITAL_COMMUNITY)

## 2023-12-13 ENCOUNTER — Telehealth (HOSPITAL_COMMUNITY): Payer: Self-pay

## 2023-12-13 ENCOUNTER — Telehealth: Payer: Self-pay

## 2023-12-13 NOTE — Telephone Encounter (Signed)
 Medication management - Call with patient today after he missed his appointment for due Haldol  Deconoate injection. Patient reported he had been tired today and was sorry for missing appointment. Patient agreed to come in on 12/18/23 at 9:30 am for his missed injection appointment from today.

## 2023-12-13 NOTE — Telephone Encounter (Signed)
 Copied from CRM [id number]. Topic: General - Other >> Dec 13, 2023  7:59 AM Emylou G wrote: Reason for CRM: Patient called thought he had an appt for Haleperidel???  Does he need to make one? Pls call his number on file.

## 2023-12-14 NOTE — Telephone Encounter (Signed)
 Patient identified by name and date of birth.  Patient has been informed to contact his behavioral health specialist  to receive injection.

## 2023-12-18 ENCOUNTER — Ambulatory Visit (INDEPENDENT_AMBULATORY_CARE_PROVIDER_SITE_OTHER)

## 2023-12-18 VITALS — BP 125/87 | Ht 69.0 in

## 2023-12-18 DIAGNOSIS — F250 Schizoaffective disorder, bipolar type: Secondary | ICD-10-CM

## 2023-12-18 MED ORDER — HALOPERIDOL DECANOATE 100 MG/ML IM SOLN
100.0000 mg | Freq: Once | INTRAMUSCULAR | Status: AC
  Administered 2023-12-18: 100 mg via INTRAMUSCULAR

## 2023-12-18 NOTE — Progress Notes (Cosign Needed Addendum)
 Patient presents today for injection of haldol  100 mg/ 1 ML administered in right deltoid. Pt states he has no complaints regarding injection.    JNL, CMA   Patient is in good spirits and denies any AVH, SI, and HI. Pt states he is feeling good with dosage and says he does experiences some "tinglenesses" at night sometimes but doesn't believe its due to medications.

## 2023-12-19 ENCOUNTER — Other Ambulatory Visit (HOSPITAL_COMMUNITY): Payer: Self-pay | Admitting: Physician Assistant

## 2023-12-19 DIAGNOSIS — F411 Generalized anxiety disorder: Secondary | ICD-10-CM

## 2024-01-15 ENCOUNTER — Ambulatory Visit (INDEPENDENT_AMBULATORY_CARE_PROVIDER_SITE_OTHER)

## 2024-01-15 ENCOUNTER — Encounter (HOSPITAL_COMMUNITY): Payer: Self-pay

## 2024-01-15 VITALS — BP 138/109 | HR 111 | Wt 288.4 lb

## 2024-01-15 DIAGNOSIS — F200 Paranoid schizophrenia: Secondary | ICD-10-CM

## 2024-01-15 DIAGNOSIS — F411 Generalized anxiety disorder: Secondary | ICD-10-CM | POA: Diagnosis not present

## 2024-01-15 DIAGNOSIS — G4700 Insomnia, unspecified: Secondary | ICD-10-CM

## 2024-01-15 MED ORDER — HALOPERIDOL DECANOATE 100 MG/ML IM SOLN
100.0000 mg | Freq: Once | INTRAMUSCULAR | Status: AC
Start: 2024-01-15 — End: 2024-01-15
  Administered 2024-01-15: 100 mg via INTRAMUSCULAR

## 2024-01-15 NOTE — Progress Notes (Signed)
  Patient arrived with his Haldol  100mg  from his pharmacy. Given in RIGHT Deltoid without issue or complaint. States he has no side effects. Has been taking his BP medication.

## 2024-01-16 ENCOUNTER — Telehealth (HOSPITAL_COMMUNITY): Admitting: Physician Assistant

## 2024-01-16 ENCOUNTER — Encounter (HOSPITAL_COMMUNITY): Payer: Self-pay

## 2024-01-18 ENCOUNTER — Other Ambulatory Visit (HOSPITAL_COMMUNITY): Payer: Self-pay | Admitting: Physician Assistant

## 2024-01-18 DIAGNOSIS — F250 Schizoaffective disorder, bipolar type: Secondary | ICD-10-CM

## 2024-02-06 ENCOUNTER — Other Ambulatory Visit: Payer: Self-pay | Admitting: Family Medicine

## 2024-02-06 DIAGNOSIS — F250 Schizoaffective disorder, bipolar type: Secondary | ICD-10-CM

## 2024-02-06 NOTE — Telephone Encounter (Signed)
 Copied from CRM [id number]. Topic: Clinical - Medication Refill >> Feb 06, 2024 10:21 AM Oddis Bench wrote: Medication: divalproex  (DEPAKOTE  ER) 500 MG 24 hr tablet and haloperidol  (HALDOL ) 10 MG tablet  Has the patient contacted their pharmacy? Yes j[facility] calling to divalproex  500mg  tablet and haloperidol  10 mg   This is the patient's preferred pharmacy:    [facility], [place] - [address] [place] [address] Phone: [phone] Fax: [phone]    Is this the correct pharmacy for this prescription? Yes If no, delete pharmacy and type the correct one.   Has the prescription been filled recently? Yes  Is the patient out of the medication? Yes  Has the patient been seen for an appointment in the last year OR does the patient have an upcoming appointment? Yes  Can we respond through MyChart? No  Agent: Please be advised that Rx refills may take up to 3 business days. We ask that you follow-up with your pharmacy.

## 2024-02-08 NOTE — Telephone Encounter (Signed)
 Requested medications are due for refill today.  unsure  Requested medications are on the active medications list.  yes  Last refill. Depakote  refilled 11/14/2023 #90 0 rf, Haldol  refilled 11/21/2023 1mL 11 rf  Future visit scheduled.   yes  Notes to clinic.  Rx's signed by Jacqulynn Maxin    Requested Prescriptions  Pending Prescriptions Disp Refills   divalproex  (DEPAKOTE  ER) 500 MG 24 hr tablet 90 tablet 0    Sig: Take 1 tablet (500 mg total) by mouth daily.     Neurology:  Anticonvulsants - Valproates Failed - 02/08/2024 12:29 PM      Failed - HGB in normal range and within 360 days    Hemoglobin  Date Value Ref Range Status  09/19/2023 12.6 (L) 13.0 - 17.7 g/dL Final         Failed - HCT in normal range and within 360 days    Hematocrit  Date Value Ref Range Status  09/19/2023 36.8 (L) 37.5 - 51.0 % Final         Passed - AST in normal range and within 360 days    AST  Date Value Ref Range Status  09/19/2023 17 0 - 40 IU/L Final         Passed - ALT in normal range and within 360 days    ALT  Date Value Ref Range Status  09/19/2023 12 0 - 44 IU/L Final         Passed - PLT in normal range and within 360 days    Platelets  Date Value Ref Range Status  09/19/2023 269 150 - 450 x10E3/uL Final         Passed - WBC in normal range and within 360 days    WBC  Date Value Ref Range Status  09/19/2023 7.5 3.4 - 10.8 x10E3/uL Final  11/07/2022 14.2 (H) 4.0 - 10.5 K/uL Final         Passed - Valproic Acid  (serum) in normal range and within 360 days    Valproic Acid  Lvl  Date Value Ref Range Status  09/19/2023 57 50 - 100 ug/mL Final    Comment:                                    Detection Limit = 4                            <4 indicates None Detected Toxicity may occur at levels of 100-500. Measurements of free unbound valproic acid  may improve the assess- ment of clinical response.          Passed - Completed PHQ-2 or PHQ-9 in the last 360 days      Passed  - Patient is not pregnant      Passed - Valid encounter within last 12 months    Recent Outpatient Visits           4 months ago Encounter for annual physical exam   [facility] [place]. [facility] Collins Dean, NP   8 months ago Encounter to establish care   [facility] [place]. [facility] Collins Dean, NP   6 years ago Essential hypertension   [facility] at [facility], Ronna Coho, PA-C  haloperidol  decanoate (HALDOL  DECANOATE) 100 MG/ML injection 1 mL 11    Sig: Inject 1 mL (100 mg total) into the muscle every 30 (thirty) days.     Off-Protocol Failed - 02/08/2024 12:29 PM      Failed - Medication not assigned to a protocol, review manually.      Passed - Valid encounter within last 12 months    Recent Outpatient Visits           4 months ago Encounter for annual physical exam   [facility] [place]. [facility] Collins Dean, NP   8 months ago Encounter to establish care   [facility] [place] [place]. [facility] Collins Dean, NP   6 years ago Essential hypertension   Primary Care at [facility], Ronna Coho, PA-C

## 2024-02-08 NOTE — Telephone Encounter (Signed)
 Requested medication (s) are due for refill today: na   Requested medication (s) are on the active medication list: yes   Last refill:  depakote  -11/14/23 #90 0 refills, haldol  inj- 11/21/23 #1 ml 11 refills  Future visit scheduled: na   Notes to clinic:  medication not assigned to a protocol. Last ordered by Jacqulynn Maxin, PA. Do you want to refill Rxs?     Requested Prescriptions  Pending Prescriptions Disp Refills   divalproex  (DEPAKOTE  ER) 500 MG 24 hr tablet 90 tablet 0    Sig: Take 1 tablet (500 mg total) by mouth daily.     Neurology:  Anticonvulsants - Valproates Failed - 02/08/2024 12:37 PM      Failed - HGB in normal range and within 360 days    Hemoglobin  Date Value Ref Range Status  09/19/2023 12.6 (L) 13.0 - 17.7 g/dL Final         Failed - HCT in normal range and within 360 days    Hematocrit  Date Value Ref Range Status  09/19/2023 36.8 (L) 37.5 - 51.0 % Final         Passed - AST in normal range and within 360 days    AST  Date Value Ref Range Status  09/19/2023 17 0 - 40 IU/L Final         Passed - ALT in normal range and within 360 days    ALT  Date Value Ref Range Status  09/19/2023 12 0 - 44 IU/L Final         Passed - PLT in normal range and within 360 days    Platelets  Date Value Ref Range Status  09/19/2023 269 150 - 450 x10E3/uL Final         Passed - WBC in normal range and within 360 days    WBC  Date Value Ref Range Status  09/19/2023 7.5 3.4 - 10.8 x10E3/uL Final  11/07/2022 14.2 (H) 4.0 - 10.5 K/uL Final         Passed - Valproic Acid  (serum) in normal range and within 360 days    Valproic Acid  Lvl  Date Value Ref Range Status  09/19/2023 57 50 - 100 ug/mL Final    Comment:                                    Detection Limit = 4                            <4 indicates None Detected Toxicity may occur at levels of 100-500. Measurements of free unbound valproic acid  may improve the assess- ment of clinical response.           Passed - Completed PHQ-2 or PHQ-9 in the last 360 days      Passed - Patient is not pregnant      Passed - Valid encounter within last 12 months    Recent Outpatient Visits           4 months ago Encounter for annual physical exam   [facility] [place]. [facility] Collins Dean, NP   8 months ago Encounter to establish care   [facility] [place]. [facility] Collins Dean, NP   6 years ago  Essential hypertension   Primary Care at [facility], Ronna Coho, PA-C               haloperidol  decanoate (HALDOL  DECANOATE) 100 MG/ML injection 1 mL 11    Sig: Inject 1 mL (100 mg total) into the muscle every 30 (thirty) days.     Off-Protocol Failed - 02/08/2024 12:37 PM      Failed - Medication not assigned to a protocol, review manually.      Passed - Valid encounter within last 12 months    Recent Outpatient Visits           4 months ago Encounter for annual physical exam   [facility] [place]. [facility] Collins Dean, NP   8 months ago Encounter to establish care   [facility] [place]. [facility] Collins Dean, NP   6 years ago Essential hypertension   Primary Care at [facility], Ronna Coho, PA-C

## 2024-02-12 ENCOUNTER — Ambulatory Visit (HOSPITAL_COMMUNITY)

## 2024-02-13 ENCOUNTER — Telehealth (HOSPITAL_COMMUNITY): Payer: Self-pay

## 2024-02-13 NOTE — Telephone Encounter (Signed)
 Appointment - Call message left for patient to follow up on his due Haldol  Decanoate injection this week. Requested patient call our office back to set up a time to come in as we understand it is really hot this week and can do it early one morning or when he is available.  Informed patient on message we did not want him to get behind on his due injection and agreed to work around whatever schedule he would agree to for a time to meet with him for this.  No answer on patient's mobile number this date and could not leave a message on his mobile device.

## 2024-02-18 ENCOUNTER — Other Ambulatory Visit (HOSPITAL_COMMUNITY): Payer: Self-pay | Admitting: Physician Assistant

## 2024-02-18 DIAGNOSIS — F250 Schizoaffective disorder, bipolar type: Secondary | ICD-10-CM

## 2024-02-18 DIAGNOSIS — I10 Essential (primary) hypertension: Secondary | ICD-10-CM

## 2024-02-18 DIAGNOSIS — F5105 Insomnia due to other mental disorder: Secondary | ICD-10-CM

## 2024-02-21 ENCOUNTER — Ambulatory Visit (HOSPITAL_COMMUNITY)

## 2024-02-21 ENCOUNTER — Encounter (HOSPITAL_COMMUNITY): Payer: Self-pay

## 2024-02-21 ENCOUNTER — Ambulatory Visit (INDEPENDENT_AMBULATORY_CARE_PROVIDER_SITE_OTHER): Admitting: Family

## 2024-02-21 DIAGNOSIS — F250 Schizoaffective disorder, bipolar type: Secondary | ICD-10-CM

## 2024-02-21 DIAGNOSIS — F5105 Insomnia due to other mental disorder: Secondary | ICD-10-CM

## 2024-02-21 DIAGNOSIS — F99 Mental disorder, not otherwise specified: Secondary | ICD-10-CM | POA: Diagnosis not present

## 2024-02-21 MED ORDER — HALOPERIDOL DECANOATE 100 MG/ML IM SOLN
100.0000 mg | Freq: Once | INTRAMUSCULAR | Status: AC
  Administered 2024-02-21: 100 mg via INTRAMUSCULAR

## 2024-02-21 NOTE — Progress Notes (Signed)
 [facility] MD/PA/NP OP Progress Note  02/21/2024 8:19 AM Steven Clayton.  MRN:  [id number]  Chief Complaint: Long-acting injection clinic   HPI: Steven Clayton. 43 year old African-American male presents for long-acting injectable clinic.  Currently prescribed Haldol  decanoate every 28 days.  Carries a diagnosis with schizoaffective disorder, major depressive disorder, generalized anxiety disorder and insomnia.  Reports he has been taking and tolerating medications any medication side effects to include headache nausea vomiting.  He reports he takes trazodone  nightly for sleep disturbance.  States has been tolerating medications well.  He reports a good appetite.  States he is rested well throughout the night.  Mikel reports he is currently disabled not able to work at this time. Stated  the world was confused did not believe in God, states crazy about their.  Denying any any extracurricular activities outside his home.  Denies that he is currently followed by therapy services.  AIMS 0.  Steven Clayton. is sitting; she is alert/oriented x 3; calm/cooperative; and mood congruent with affect.  Patient is speaking in a clear tone at moderate volume, and normal pace; with good eye contact.  Denies illicit drug use or substance abuse history.  His thought process is coherent and relevant; There is no indication that he is currently responding to internal/external stimuli or experiencing delusional thought content.  Patient denies suicidal/self-harm/homicidal ideation, psychosis, and paranoia.  Patient has remained calm throughout assessment and has answered questions appropriately.    Visit Diagnosis:    ICD-10-CM   1. Schizoaffective disorder, bipolar type (HCC) [F25.0]  F25.0     2. Insomnia due to other mental disorder  F51.05    F99       Past Psychiatric History:   Past Medical History:  Past Medical History:  Diagnosis Date   Anxiety    Depression    Schizoaffective disorder  (HCC)    Stroke (HCC)    No past surgical history on file.  Family Psychiatric History:   Family History:  Family History  Problem Relation Age of Onset   Heart disease Mother    Hyperlipidemia Mother    Mental illness Father     Social History:  Social History   Socioeconomic History   Marital status: Single    Spouse name: Not on file   Number of children: Not on file   Years of education: Not on file   Highest education level: Not on file  Occupational History   Not on file  Tobacco Use   Smoking status: Some Days    Current packs/day: 2.00    Average packs/day: 2.0 packs/day for 11.5 years (23.0 ttl pk-yrs)    Types: Cigarettes    Start date: 08/15/2012   Smokeless tobacco: Never  Vaping Use   Vaping status: Never Used  Substance and Sexual Activity   Alcohol use: Not Currently    Alcohol/week: 1.0 standard drink of alcohol    Types: 1 Glasses of wine per week   Drug use: No   Sexual activity: Never  Other Topics Concern   Not on file  Social History Narrative   Not on file   Social Drivers of Health   Financial Resource Strain: Low Risk  (07/17/2023)   Overall Financial Resource Strain (CARDIA)    Difficulty of Paying Living Expenses: Not hard at all  Food Insecurity: No Food Insecurity (07/17/2023)   Hunger Vital Sign    Worried About Running Out of Food in the Last Year: Never true  Ran Out of Food in the Last Year: Never true  Transportation Needs: No Transportation Needs (07/17/2023)   PRAPARE - Administrator, Civil Service (Medical): No    Lack of Transportation (Non-Medical): No  Physical Activity: Inactive (07/17/2023)   Exercise Vital Sign    Days of Exercise per Week: 0 days    Minutes of Exercise per Session: 0 min  Stress: No Stress Concern Present (07/17/2023)   [name] of Occupational Health - Occupational Stress Questionnaire    Feeling of Stress : Not at all  Social Connections: Unknown (07/17/2023)   Social  Connection and Isolation Panel    Frequency of Communication with Friends and Family: More than three times a week    Frequency of Social Gatherings with Friends and Family: More than three times a week    Attends Religious Services: Not on file    Active Member of Clubs or Organizations: Yes    Attends Banker Meetings: More than 4 times per year    Marital Status: Never married    Allergies: No Known Allergies  Metabolic Disorder Labs: Lab Results  Component Value Date   HGBA1C 5.9 (H) 09/19/2023   MPG 111 11/07/2022   MPG 111 03/17/2021   Lab Results  Component Value Date   PROLACTIN 33.3 (H) 03/17/2021   PROLACTIN 26.5 (H) 01/20/2015   Lab Results  Component Value Date   CHOL 240 (H) 09/19/2023   TRIG 175 (H) 09/19/2023   HDL 44 09/19/2023   CHOLHDL 5.5 (H) 09/19/2023   VLDL 29 11/07/2022   LDLCALC 164 (H) 09/19/2023   LDLCALC 130 (H) 11/07/2022   Lab Results  Component Value Date   TSH 1.796 11/07/2022   TSH 1.898 03/17/2021    Therapeutic Level Labs: No results found for: LITHIUM Lab Results  Component Value Date   VALPROATE 57 09/19/2023   VALPROATE 46 (L) 11/20/2022   No results found for: CBMZ  Current Medications: Current Outpatient Medications  Medication Sig Dispense Refill   atorvastatin  (LIPITOR) 10 MG tablet Take 1 tablet (10 mg total) by mouth daily. 90 tablet 3   benztropine  (COGENTIN ) 1 MG tablet TAKE 1 TABLET(1 MG) BY MOUTH TWICE DAILY 180 tablet 0   cloNIDine  (CATAPRES ) 0.1 MG tablet TAKE 1 TABLET BY MOUTH DAILY 90 tablet 0   divalproex  (DEPAKOTE  ER) 500 MG 24 hr tablet Take 1 tablet (500 mg total) by mouth daily. 90 tablet 0   haloperidol  (HALDOL ) 10 MG tablet Take 1 tablet (10 mg total) by mouth 2 (two) times daily. 180 tablet 0   haloperidol  decanoate (HALDOL  DECANOATE) 100 MG/ML injection Inject 1 mL (100 mg total) into the muscle every 30 (thirty) days. 1 mL 11   hydrOXYzine  (ATARAX ) 25 MG tablet TAKE 1 TABLET BY MOUTH  3 TIMES DAILY AS NEEDED FOR ANXIETY 75 tablet 11   lisinopril  (ZESTRIL ) 20 MG tablet Take 1 tablet (20 mg total) by mouth daily. 90 tablet 1   nicotine  (NICODERM CQ  - DOSED IN MG/24 HOURS) 14 mg/24hr patch APPLY 1 PATCH TO SKIN DAILY *NEW PRESCRIPTION REQUEST* 28 patch 1   traZODone  (DESYREL ) 100 MG tablet Take 1 tablet (100 mg total) by mouth at bedtime as needed for sleep. 90 tablet 0   No current facility-administered medications for this visit.     Musculoskeletal: Strength & Muscle Tone: within normal limits Gait & Station: normal Patient leans: N/A  Psychiatric Specialty Exam: Review of Systems  There were no vitals  taken for this visit.There is no height or weight on file to calculate BMI.  General Appearance: Casual  Eye Contact:  Good  Speech:  Clear and Coherent  Volume:  Normal  Mood:  Anxious and Depressed  Affect:  Congruent  Thought Process:  Coherent  Orientation:  Full (Time, Place, and Person)  Thought Content: Logical   Suicidal Thoughts:  No  Homicidal Thoughts:  No  Memory:  Immediate;   Good Recent;   Good  Judgement:  Good  Insight:  Good  Psychomotor Activity:  Normal  Concentration:  Concentration: Good  Recall:  Good  Fund of Knowledge: Good  Language: Good  Akathisia:  No  Handed:  Right  AIMS (if indicated): done  Assets:  Communication Skills Desire for Improvement  ADL's:  Intact  Cognition: WNL  Sleep:  Good   Screenings: AIMS    Flowsheet Row [facility]  AIMS Total Score 0 0   AUDIT    Flowsheet Row [facility]  Alcohol Use Disorder Identification Test Final Score (AUDIT) 0 2   GAD-7    Flowsheet Row Video  Visit from 11/14/2023 in [facility] Office Visit from 09/19/2023 in [facility] [place] [facility] [place]. [facility] Video Visit from 09[facility]18/2023 in [facility] Office Visit from 07/18/2023 in [facility] Video Visit from 02/02/2023 in [facility]  Total GAD-7 Score 16 15 15 14 9    PHQ2-9    Flowsheet Row Video Visit from 11/14/2023 in [facility] Office Visit from 09/19/2023 in [facility] [place] [facility] [place]. [facility] Video Visit from 09/18/2023 in [facility] Office Visit from 07/18/2023 in [facility] Clinical Support from 07/17/2023 in [facility] [place] [facility] Of [place]. [facility]  PHQ-2 Total Score 2 2 2 3  0  PHQ-9 Total Score 10 15 12 13  0   Flowsheet Row Video Visit from 11/14/2023 in [facility] Video Visit from 09/18/2023 in [facility] Office Visit from 07/18/2023 in [facility]  C-SSRS RISK CATEGORY Low Risk No Risk No Risk     Assessment and Plan:  Follow-up 28 days for Haldol  Decanoate monthly injection Continue hydroxyzine  25 mg p.o. 3 times daily trazodone  25 to 50 mg p.o. nightly as needed and benzatropine 1 mg p.o. twice daily a    Collaboration of Care: Collaboration of Care: Psychiatric provider AEB ESABRA Bolster  Patient/Guardian was advised Release of Information must be obtained prior to any record release in order to collaborate their care with an outside provider. Patient/Guardian was advised if they have not already done so to contact the registration department to sign all necessary forms in order for us  to release information regarding their care.   Consent: Patient/Guardian gives verbal consent for  treatment and assignment of benefits for services provided during this visit. Patient/Guardian expressed understanding and agreed to proceed.    Staci LOISE Kerns, NP 02/21/2024, 8:19 AM

## 2024-02-21 NOTE — Progress Notes (Signed)
   Patient arrived with his Haldol  100mg  from his pharmacy. Given in LEFT Deltoid without issue or complaint. States he has no side effects.

## 2024-03-05 ENCOUNTER — Encounter (HOSPITAL_COMMUNITY): Payer: Self-pay

## 2024-03-05 ENCOUNTER — Telehealth (HOSPITAL_COMMUNITY): Admitting: Physician Assistant

## 2024-03-05 ENCOUNTER — Encounter (HOSPITAL_COMMUNITY): Payer: Self-pay | Admitting: Physician Assistant

## 2024-03-05 DIAGNOSIS — F5105 Insomnia due to other mental disorder: Secondary | ICD-10-CM

## 2024-03-05 DIAGNOSIS — F411 Generalized anxiety disorder: Secondary | ICD-10-CM | POA: Diagnosis not present

## 2024-03-05 DIAGNOSIS — F99 Mental disorder, not otherwise specified: Secondary | ICD-10-CM

## 2024-03-05 DIAGNOSIS — F250 Schizoaffective disorder, bipolar type: Secondary | ICD-10-CM | POA: Diagnosis not present

## 2024-03-05 DIAGNOSIS — I10 Essential (primary) hypertension: Secondary | ICD-10-CM | POA: Diagnosis not present

## 2024-03-05 MED ORDER — LISINOPRIL 20 MG PO TABS: 20.0000 mg | ORAL_TABLET | Freq: Every day | ORAL | 0 refills | Status: DC

## 2024-03-05 NOTE — Progress Notes (Signed)
 BH MD/PA/NP OP Progress Note  Virtual Visit via Video Note  I connected with [name]. on 03/05/24 at  2:00 PM EDT by a video enabled telemedicine application and verified that I am speaking with the correct person using two identifiers.  Location: Patient: Home Provider: Clinic   I discussed the limitations of evaluation and management by telemedicine and the availability of in person appointments. The patient expressed understanding and agreed to proceed.  Follow Up Instructions:   I discussed the assessment and treatment plan with the patient. The patient was provided an opportunity to ask questions and all were answered. The patient agreed with the plan and demonstrated an understanding of the instructions.   The patient was advised to call back or seek an in-person evaluation if the symptoms worsen or if the condition fails to improve as anticipated.  I provided 17 minutes of non-face-to-face time during this encounter.  Kwane Rohl E Matan Steen, PA    03/05/2024 2:00 PM [name].  MRN:  [id number]  Chief Complaint:  Chief Complaint  Patient presents with   Follow-up   HPI:   Steven Clayton. is a 43 year old, African-American male with a past psychiatric history significant for schizoaffective disorder (bipolar type), anxiety, and insomnia who presents to [facility] via virtual video visit for follow-up and medication management.  Patient is currently being managed on the following psychiatric medications:  Benztropine  1 mg 2 times daily Divalproex  500 mg 24-hour tablet daily Haloperidol  10 mg 2 times daily Haloperidol  decanoate 100 mg/mL injection (first dose given on 11/18/2022) Hydroxyzine  25 mg 3 times daily as needed Trazodone  100 mg at bedtime  Patient reports that he has been taking his medications regularly and denies experiencing any adverse side effects at this time.  Patient denies overt depressive  symptoms but does endorse some anxiety.  Patient rates his anxiety a 3 out of 10 but denies any new stressors at this time.  Patient denies any changes in his behavior.  Patient denies paranoia and further denies auditory or visual hallucinations.  A PHQ-9 screen was performed with the patient scoring a 14.  A GAD-7 screen was also performed with the patient scoring a 12.  Patient is alert and oriented x 4, calm, cooperative, and fully engaged in conversation during the encounter.  Patient endorses excellent mood.  Patient exhibits euthymic mood with appropriate affect.  Patient denies suicidal or homicidal ideations.  He further denies auditory or visual hallucinations and does not appear to be responding to internal/external stimuli.  Patient endorses fair sleep and receives on average 6 to 7 hours of sleep per night.  Patient endorses good appetite and eats on average 3 meals per day.  Patient denies alcohol consumption or illicit drug use.  Patient endorses tobacco use and smokes on average 3 to 4 cigarettes/day.  Visit Diagnosis:    ICD-10-CM   1. Essential hypertension  I10 lisinopril  (ZESTRIL ) 20 MG tablet    2. Insomnia due to other mental disorder  F51.05    F99       Past Psychiatric History:  Patient has a past psychiatric history significant for the following: Schizoaffective disorder (bipolar type), anxiety, and insomnia  Patient has a past history of hospitalization due to mental health and was recently hospitalized on 11/07/2022.   Past Medical History:  Past Medical History:  Diagnosis Date   Anxiety    Depression    Schizoaffective disorder (HCC)    Stroke (HCC)  History reviewed. No pertinent surgical history.  Family Psychiatric History:  Patient denies any specific family history of psychiatric  illness. He does report that his father suffered traumatic brain injury while being in the [employer].   Mother - major depressive disorder  Family History:  Family  History  Problem Relation Age of Onset   Heart disease Mother    Hyperlipidemia Mother    Mental illness Father     Social History:  Social History   Socioeconomic History   Marital status: Single    Spouse name: Not on file   Number of children: Not on file   Years of education: Not on file   Highest education level: Not on file  Occupational History   Not on file  Tobacco Use   Smoking status: Some Days    Current packs/day: 2.00    Average packs/day: 2.0 packs/day for 11.6 years (23.1 ttl pk-yrs)    Types: Cigarettes    Start date: 08/15/2012   Smokeless tobacco: Never  Vaping Use   Vaping status: Never Used  Substance and Sexual Activity   Alcohol use: Not Currently    Alcohol/week: 1.0 standard drink of alcohol    Types: 1 Glasses of wine per week   Drug use: No   Sexual activity: Never  Other Topics Concern   Not on file  Social History Narrative   Not on file   Social Drivers of Health   Financial Resource Strain: Low Risk  (07/17/2023)   Overall Financial Resource Strain (CARDIA)    Difficulty of Paying Living Expenses: Not hard at all  Food Insecurity: No Food Insecurity (07/17/2023)   Hunger Vital Sign    Worried About Running Out of Food in the Last Year: Never true    Ran Out of Food in the Last Year: Never true  Transportation Needs: No Transportation Needs (07/17/2023)   PRAPARE - Administrator, Civil Service (Medical): No    Lack of Transportation (Non-Medical): No  Physical Activity: Inactive (07/17/2023)   Exercise Vital Sign    Days of Exercise per Week: 0 days    Minutes of Exercise per Session: 0 min  Stress: No Stress Concern Present (07/17/2023)   [name] of Occupational Health - Occupational Stress Questionnaire    Feeling of Stress : Not at all  Social Connections: Unknown (07/17/2023)   Social Connection and Isolation Panel    Frequency of Communication with Friends and Family: More than three times a week     Frequency of Social Gatherings with Friends and Family: More than three times a week    Attends Religious Services: Not on file    Active Member of Clubs or Organizations: Yes    Attends Banker Meetings: More than 4 times per year    Marital Status: Never married    Allergies: No Known Allergies  Metabolic Disorder Labs: Lab Results  Component Value Date   HGBA1C 5.9 (H) 09/19/2023   MPG 111 11/07/2022   MPG 111 03/17/2021   Lab Results  Component Value Date   PROLACTIN 33.3 (H) 03/17/2021   PROLACTIN 26.5 (H) 01/20/2015   Lab Results  Component Value Date   CHOL 240 (H) 09/19/2023   TRIG 175 (H) 09/19/2023   HDL 44 09/19/2023   CHOLHDL 5.5 (H) 09/19/2023   VLDL 29 11/07/2022   LDLCALC 164 (H) 09/19/2023   LDLCALC 130 (H) 11/07/2022   Lab Results  Component Value Date   TSH 1.796  11/07/2022   TSH 1.898 03/17/2021    Therapeutic Level Labs: No results found for: LITHIUM Lab Results  Component Value Date   VALPROATE 57 09/19/2023   VALPROATE 46 (L) 11/20/2022   No results found for: CBMZ  Current Medications: Current Outpatient Medications  Medication Sig Dispense Refill   atorvastatin  (LIPITOR) 10 MG tablet Take 1 tablet (10 mg total) by mouth daily. 90 tablet 3   benztropine  (COGENTIN ) 1 MG tablet TAKE 1 TABLET BY MOUTH TWICE DAILY 180 tablet 11   cloNIDine  (CATAPRES ) 0.1 MG tablet TAKE 1 TABLET BY MOUTH DAILY 90 tablet 11   divalproex  (DEPAKOTE  ER) 500 MG 24 hr tablet TAKE 1 TABLET BY MOUTH DAILY 90 tablet 11   haloperidol  (HALDOL ) 10 MG tablet TAKE 1 TABLET BY MOUTH TWICE DAILY 60 tablet 11   haloperidol  decanoate (HALDOL  DECANOATE) 100 MG/ML injection Inject 1 mL (100 mg total) into the muscle every 30 (thirty) days. 1 mL 11   hydrOXYzine  (ATARAX ) 25 MG tablet TAKE 1 TABLET BY MOUTH 3 TIMES DAILY AS NEEDED FOR ANXIETY 75 tablet 11   lisinopril  (ZESTRIL ) 20 MG tablet Take 1 tablet (20 mg total) by mouth daily. 30 tablet 0   nicotine   (NICODERM CQ  - DOSED IN MG/24 HOURS) 14 mg/24hr patch APPLY 1 PATCH TO SKIN DAILY *NEW PRESCRIPTION REQUEST* 28 patch 1   traZODone  (DESYREL ) 100 MG tablet TAKE 1 TABLET BY MOUTH AT BEDTIME AS NEEDED FOR SLEEP 90 tablet 11   No current facility-administered medications for this visit.     Musculoskeletal: Strength & Muscle Tone: within normal limits Gait & Station: normal Patient leans: N/A  Psychiatric Specialty Exam: Review of Systems  Psychiatric/Behavioral:  Positive for dysphoric mood. Negative for decreased concentration, hallucinations, self-injury, sleep disturbance and suicidal ideas. The patient is nervous/anxious. The patient is not hyperactive.     There were no vitals taken for this visit.There is no height or weight on file to calculate BMI.  General Appearance: Casual  Eye Contact:  Good  Speech:  Clear and Coherent and Normal Rate  Volume:  Normal  Mood:  Anxious and Depressed  Affect:  Appropriate  Thought Process:  Coherent, Goal Directed, and Descriptions of Associations: Intact  Orientation:  Full (Time, Place, and Person)  Thought Content: WDL   Suicidal Thoughts:  No  Homicidal Thoughts:  No  Memory:  Immediate;   Good Recent;   Good Remote;   Good  Judgement:  Good  Insight:  Fair  Psychomotor Activity:  Normal  Concentration:  Concentration: Good and Attention Span: Good  Recall:  Good  Fund of Knowledge: Good  Language: Good  Akathisia:  No  Handed:  Right  AIMS (if indicated): not done  Assets:  Communication Skills Desire for Improvement Social Support  ADL's:  Intact  Cognition: WNL  Sleep:  Good   Screenings: AIMS    Flowsheet Row Video Visit from 03/05/2024 in [facility]) [facility]  AIMS Total Score 0 0 0   AUDIT    Flowsheet Row Ad[facility]  [facility] ([facility]  Alcohol Use Disorder Identification Test Final Score (AUDIT) 0 2   GAD-7    Flowsheet Row Video Visit from 03/05/2024 in [facility] Video Visit from 11/14/2023  in [facility] Office Visit from 09/19/2023 in [facility] [place] Video Visit from 09/18/2023 in [facility] Office Visit from 07/18/2023 in [facility]  Total GAD-7 Score 12 16 15 15 14    [id number]    Flowsheet Row Video Visit from 03/05/2024 in [facility] Video Visit from 11/14/2023 in [facility] Office Visit from 09/19/2023 in [facility] Video Visit from 09/18/2023 in [facility] Office Visit from 07/18/2023 in [facility]  PHQ-2 Total Score 2 2 2 2 3   PHQ-9 Total Score 14 10 15 12 13    Flowsheet Row Video Visit from 03/05/2024 in [facility] Video Visit from 11/14/2023 in [facility] Video Visit from 09/18/2023 in [facility]  C-SSRS RISK CATEGORY No Risk Low Risk No Risk     Assessment and Plan:   Steven Clayton. is a 43 year old, African-American male with a past psychiatric history significant for schizoaffective disorder (bipolar type), anxiety, and insomnia who presents to [facility] via virtual video visit for follow-up and medication management.  Patient presents to the encounter stating that he continues to take his medications regularly and denies experiencing any adverse side effects at this time.   An aims assessment was performed with the patient scoring a 0.  Patient denies overt depressive symptoms but continues to endorse some anxiety.  A PHQ-9 screen was performed with the patient scoring a 14.  A GAD-7 screen was also performed with the patient scoring a 12.  Patient denies changes in his behavior nor does he endorse paranoia.  Patient does not appear to be responding to internal/external stimuli.  Patient endorses stability on his current medication regimen and would like to continue taking his medications as prescribed.  Patient denies the need for refills at this time.  A [place] Suicide Severity Rating Scale was performed with the patient being considered no risk.  Patient denies suicidal ideations and is able to contract for safety at this time.    Collaboration of Care: Collaboration of Care: Medication Management AEB provider managing patient's psychiatric medications, Primary Care Provider AEB patient was provided resources for primary care providers, and Psychiatrist AEB patient is being followed by mental health provider at this facility  Patient/Guardian was advised Release of Information must be obtained prior to any record release in order to collaborate their care with an outside provider. Patient/Guardian was advised if they have not already done so to contact the registration department to sign all necessary forms in order for us  to release information regarding their care.   Consent: Patient/Guardian gives verbal consent for treatment and assignment of benefits for services provided during this visit. Patient/Guardian expressed understanding and agreed to proceed.   1. Essential hypertension  - lisinopril  (ZESTRIL ) 20 MG tablet; Take 1 tablet (20 mg total) by mouth daily.  Dispense: 30 tablet; Refill: 0  2. Insomnia due to other mental disorder Patient to continue taking trazodone  100 mg at bedtime as needed for the management of his insomnia  3. Schizoaffective  disorder, bipolar type (HCC) [F25.0] (Primary) Patient to continue receiving haloperidol  decanoate 100 mg/mL injection every 30 days for the management of his schizoaffective disorder Patient to continue taking Depakote   500 mg daily for the management of his schizoaffective disorder (bipolar type) Patient to continue taking haloperidol  10 mg 2 times daily for the management of his schizoaffective disorder (bipolar type) Patient to continue taking benztropine  1 mg 2 times daily  4. Generalized anxiety disorder Patient to continue taking hydroxyzine  25 mg 3 times daily as needed  Patient to follow up with [facility] clinic on 03/20/2024 for his long-acting injectable to be administered Patient to follow up in 3 months Provider spent a total of 17 minutes with the patient/reviewing patient's chart  Reginia FORBES Bolster, PA 03/05/2024, 2:00 PM

## 2024-03-06 ENCOUNTER — Other Ambulatory Visit (HOSPITAL_COMMUNITY): Payer: Self-pay | Admitting: Physician Assistant

## 2024-03-06 DIAGNOSIS — F250 Schizoaffective disorder, bipolar type: Secondary | ICD-10-CM

## 2024-03-20 ENCOUNTER — Ambulatory Visit (INDEPENDENT_AMBULATORY_CARE_PROVIDER_SITE_OTHER)

## 2024-03-20 ENCOUNTER — Other Ambulatory Visit (HOSPITAL_COMMUNITY): Payer: Self-pay | Admitting: Physician Assistant

## 2024-03-20 VITALS — BP 146/94 | HR 93 | Ht 69.0 in | Wt 297.2 lb

## 2024-03-20 DIAGNOSIS — F250 Schizoaffective disorder, bipolar type: Secondary | ICD-10-CM

## 2024-03-20 DIAGNOSIS — I10 Essential (primary) hypertension: Secondary | ICD-10-CM

## 2024-03-20 MED ORDER — HALOPERIDOL DECANOATE 100 MG/ML IM SOLN
100.0000 mg | Freq: Once | INTRAMUSCULAR | Status: AC
  Administered 2024-03-20: 100 mg via INTRAMUSCULAR

## 2024-03-20 NOTE — Progress Notes (Cosign Needed)
 Patient presents today for injection of haldol  100 mg/ 1 ML administered in right deltoid. Pt states he has no complaints regarding injection.    JNL, CMA   Patient is in good spirits and denies any AVH, SI, and HI.

## 2024-04-17 ENCOUNTER — Encounter (HOSPITAL_COMMUNITY): Payer: Self-pay

## 2024-04-17 ENCOUNTER — Ambulatory Visit (INDEPENDENT_AMBULATORY_CARE_PROVIDER_SITE_OTHER)

## 2024-04-17 VITALS — BP 144/111 | HR 87 | Ht 69.0 in | Wt 298.2 lb

## 2024-04-17 DIAGNOSIS — F200 Paranoid schizophrenia: Secondary | ICD-10-CM

## 2024-04-17 DIAGNOSIS — F411 Generalized anxiety disorder: Secondary | ICD-10-CM | POA: Diagnosis not present

## 2024-04-17 MED ORDER — HALOPERIDOL DECANOATE 100 MG/ML IM SOLN
100.0000 mg | Freq: Once | INTRAMUSCULAR | Status: AC
  Administered 2024-04-17: 100 mg via INTRAMUSCULAR

## 2024-04-17 NOTE — Progress Notes (Cosign Needed)
 Patient presents today for injection of haldol  100 mg/ 1 ML administered in right deltoid. Pt states he has no complaints regarding injection.    JNL, CMA   Patient is in good spirits and denies any AVH, SI, and HI.

## 2024-05-15 ENCOUNTER — Encounter (HOSPITAL_COMMUNITY): Payer: Self-pay

## 2024-05-15 ENCOUNTER — Other Ambulatory Visit (HOSPITAL_COMMUNITY): Payer: Self-pay | Admitting: Psychiatry

## 2024-05-15 ENCOUNTER — Telehealth (HOSPITAL_COMMUNITY): Payer: Self-pay

## 2024-05-15 ENCOUNTER — Ambulatory Visit (INDEPENDENT_AMBULATORY_CARE_PROVIDER_SITE_OTHER)

## 2024-05-15 VITALS — BP 173/118 | HR 107 | Wt 301.6 lb

## 2024-05-15 DIAGNOSIS — I10 Essential (primary) hypertension: Secondary | ICD-10-CM

## 2024-05-15 DIAGNOSIS — F200 Paranoid schizophrenia: Secondary | ICD-10-CM

## 2024-05-15 MED ORDER — CLONIDINE HCL 0.1 MG PO TABS: 0.1000 mg | ORAL_TABLET | Freq: Every day | ORAL | 11 refills | Status: AC

## 2024-05-15 MED ORDER — LISINOPRIL 20 MG PO TABS: 20.0000 mg | ORAL_TABLET | Freq: Every day | ORAL | 3 refills | Status: AC

## 2024-05-15 MED ORDER — HALOPERIDOL DECANOATE 100 MG/ML IM SOLN
100.0000 mg | INTRAMUSCULAR | Status: AC
  Administered 2024-05-15 – 2024-08-27 (×4): 100 mg via INTRAMUSCULAR

## 2024-05-15 NOTE — Telephone Encounter (Signed)
 Patient prestned to office for injection. Prior to med administration, patient vitals were obtained. Blood Pressure was elevated and reassessed twice. Patient was seen provider, to inform patient of the risks of obtaining injection and hypertension.

## 2024-05-15 NOTE — Telephone Encounter (Signed)
 Provider discussed risk and benefits of giving injection with elevated blood pressure.  He endorsed understanding and notes that he would still like to receive his injection.  He informed Clinical research associate that he has not taken his blood pressure medications because he did not receive them from the pharmacy.  Provider agreeable to refilling blood pressure medications and asked patient to follow-up with his primary care doctor.  He was agreeable to this.  Provider discussed this with Mr. Steven Clayton patient provider.

## 2024-05-15 NOTE — Progress Notes (Cosign Needed)
 Patient presented to office for injection of Haldol  100 mg/ml. Patient's blood pressure was elevated. Patient was seen by shot clinic provider due to hypertension. Provider gave permission to administer the medication. Injection was given in patient's left deltoid. Patient tolerated injection well. Patient will return in 30 days.

## 2024-05-30 ENCOUNTER — Telehealth (HOSPITAL_COMMUNITY): Admitting: Physician Assistant

## 2024-05-30 ENCOUNTER — Encounter (HOSPITAL_COMMUNITY): Payer: Self-pay | Admitting: Physician Assistant

## 2024-05-30 DIAGNOSIS — F411 Generalized anxiety disorder: Secondary | ICD-10-CM | POA: Diagnosis not present

## 2024-05-30 DIAGNOSIS — F5105 Insomnia due to other mental disorder: Secondary | ICD-10-CM | POA: Diagnosis not present

## 2024-05-30 DIAGNOSIS — F250 Schizoaffective disorder, bipolar type: Secondary | ICD-10-CM

## 2024-05-30 DIAGNOSIS — F99 Mental disorder, not otherwise specified: Secondary | ICD-10-CM

## 2024-05-30 DIAGNOSIS — I10 Essential (primary) hypertension: Secondary | ICD-10-CM

## 2024-05-30 MED ORDER — TRAZODONE HCL 300 MG PO TABS: 300.0000 mg | ORAL_TABLET | Freq: Every evening | ORAL | 2 refills | Status: DC | PRN

## 2024-05-30 NOTE — Progress Notes (Signed)
 BH MD/PA/NP OP Progress Note  Virtual Visit via Video Note  I connected with Steven Trudy Raddle. on 05/30/24 at 10:30 AM EDT by a video enabled telemedicine application and verified that I am speaking with the correct person using two identifiers.  Location: Patient: Home Provider: Clinic   I discussed the limitations of evaluation and management by telemedicine and the availability of in person appointments. The patient expressed understanding and agreed to proceed.  Follow Up Instructions:   I discussed the assessment and treatment plan with the patient. The patient was provided an opportunity to ask questions and all were answered. The patient agreed with the plan and demonstrated an understanding of the instructions.   The patient was advised to call back or seek an in-person evaluation if the symptoms worsen or if the condition fails to improve as anticipated.  I provided 23 minutes of non-face-to-face time during this encounter.  Adriana Lina E Zyiere Rosemond, PA    05/30/2024 10:30 AM Steven Trudy Raddle.  MRN:  [id number]  Chief Complaint:  Chief Complaint  Patient presents with   Follow-up   Medication Management   HPI:   Steven Burkland. is a 43 year old, African-American male with a past psychiatric history significant for schizoaffective disorder (bipolar type), anxiety, and insomnia who presents to [facility] via virtual video visit for follow-up and medication management.  Patient is currently being managed on the following psychiatric medications:  Benztropine  1 mg 2 times daily Divalproex  500 mg 24-hour tablet daily Haloperidol  10 mg 2 times daily Haloperidol  decanoate 100 mg/mL injection (first dose given on 11/18/2022) Hydroxyzine  25 mg 3 times daily as needed Trazodone  100 mg at bedtime Clonidine  0.1 mg daily  Patient reports that his use of trazodone  has been minimally effective in managing his sleep.  When taking the  medication, patient reports that he is up until 2 to 3 AM before he is able to go to sleep.  Patient reports that he has tried doubling up on his use of trazodone  with little efficacy in managing his sleep.  Patient also informed provider that he has been receiving too many prescriptions of his medications from his pharmacy [facility], based out in [place] ).  He reports that he has tried reaching out to the pharmacy but no one ever answers.  Provider informed patient that his pharmacy will be reached out to so as to discuss limiting the amount of medications mailed to the patient.  Patient vocalized understanding.  Patient denies overt depressive symptoms at this time nor does he endorse anxiety.  Patient denies changes in his behavior and further denies auditory or visual hallucinations.  He reports no other issues or concerns at this time.  A PHQ-9 screen was performed with the patient scoring a 10.  A GAD-7 screen was also performed with the patient scoring a 14.  Patient is alert and oriented x 4, calm, cooperative, and fully engaged in conversation during the encounter.  Despite his screening assessment scores, patient endorses feeling excited.  Patient exhibits euthymic mood with appropriate affect.  Patient denies suicidal or homicidal ideations.  He further denies auditory or visual hallucinations and does not appear to be responding to internal/external stimuli.  Patient endorses fair sleep and receives on average 5 to 6 hours of sleep per night.  Patient endorses fair appetite and eats on average 2 meals per day.  Patient denies alcohol consumption or illicit drug use.  Patient endorses tobacco use and smokes on average 3 cigarettes/day.  Visit Diagnosis:    ICD-10-CM   1. Insomnia due to other mental disorder  F51.05 traZODone  (DESYREL ) 300 MG tablet   F99     2. Essential hypertension  I10     3. Schizoaffective disorder, bipolar type (HCC)  F25.0     4. Anxiety state  F41.1       Past  Psychiatric History:  Patient has a past psychiatric history significant for the following: Schizoaffective disorder (bipolar type), anxiety, and insomnia  Patient has a past history of hospitalization due to mental health and was recently hospitalized on 11/07/2022.   Past Medical History:  Past Medical History:  Diagnosis Date   Anxiety    Depression    Schizoaffective disorder (HCC)    Stroke [facility])    History reviewed. No pertinent surgical history.  Family Psychiatric History:  Patient denies any specific family history of psychiatric  illness. He does report that his father suffered traumatic brain injury while being in the [employer].   Mother - major depressive disorder  Family History:  Family History  Problem Relation Age of Onset   Heart disease Mother    Hyperlipidemia Mother    Mental illness Father     Social History:  Social History   Socioeconomic History   Marital status: Single    Spouse name: Not on file   Number of children: Not on file   Years of education: Not on file   Highest education level: Not on file  Occupational History   Not on file  Tobacco Use   Smoking status: Some Days    Current packs/day: 2.00    Average packs/day: 2.0 packs/day for 11.8 years (23.6 ttl pk-yrs)    Types: Cigarettes    Start date: 08/15/2012   Smokeless tobacco: Never  Vaping Use   Vaping status: Never Used  Substance and Sexual Activity   Alcohol use: Not Currently    Alcohol/week: 1.0 standard drink of alcohol    Types: 1 Glasses of wine per week   Drug use: No   Sexual activity: Never  Other Topics Concern   Not on file  Social History Narrative   Not on file   Social Drivers of Health   Financial Resource Strain: Low Risk  (07/17/2023)   Overall Financial Resource Strain (CARDIA)    Difficulty of Paying Living Expenses: Not hard at all  Food Insecurity: No Food Insecurity (07/17/2023)   Hunger Vital Sign    Worried About Running Out of Food in the  Last Year: Never true    Ran Out of Food in the Last Year: Never true  Transportation Needs: No Transportation Needs (07/17/2023)   PRAPARE - Administrator, Civil Service (Medical): No    Lack of Transportation (Non-Medical): No  Physical Activity: Inactive (07/17/2023)   Exercise Vital Sign    Days of Exercise per Week: 0 days    Minutes of Exercise per Session: 0 min  Stress: No Stress Concern Present (07/17/2023)   [name] of Occupational Health - Occupational Stress Questionnaire    Feeling of Stress : Not at all  Social Connections: Unknown (07/17/2023)   Social Connection and Isolation Panel    Frequency of Communication with Friends and Family: More than three times a week    Frequency of Social Gatherings with Friends and Family: More than three times a week    Attends Religious Services: Not on Insurance claims handler of Clubs or Organizations: Yes  Attends Banker Meetings: More than 4 times per year    Marital Status: Never married    Allergies: No Known Allergies  Metabolic Disorder Labs: Lab Results  Component Value Date   HGBA1C 5.9 (H) 09/19/2023   MPG 111 11/07/2022   MPG 111 03/17/2021   Lab Results  Component Value Date   PROLACTIN 33.3 (H) 03/17/2021   PROLACTIN 26.5 (H) 01/20/2015   Lab Results  Component Value Date   CHOL 240 (H) 09/19/2023   TRIG 175 (H) 09/19/2023   HDL 44 09/19/2023   CHOLHDL 5.5 (H) 09/19/2023   VLDL 29 11/07/2022   LDLCALC 164 (H) 09/19/2023   LDLCALC 130 (H) 11/07/2022   Lab Results  Component Value Date   TSH 1.796 11/07/2022   TSH 1.898 03/17/2021    Therapeutic Level Labs: No results found for: LITHIUM Lab Results  Component Value Date   VALPROATE 57 09/19/2023   VALPROATE 46 (L) 11/20/2022   No results found for: CBMZ  Current Medications: Current Outpatient Medications  Medication Sig Dispense Refill   atorvastatin  (LIPITOR) 10 MG tablet Take 1 tablet (10 mg total)  by mouth daily. 90 tablet 3   benztropine  (COGENTIN ) 1 MG tablet TAKE 1 TABLET BY MOUTH TWICE DAILY 180 tablet 11   cloNIDine  (CATAPRES ) 0.1 MG tablet Take 1 tablet (0.1 mg total) by mouth daily. 90 tablet 11   divalproex  (DEPAKOTE  ER) 500 MG 24 hr tablet TAKE 1 TABLET BY MOUTH DAILY 90 tablet 11   haloperidol  (HALDOL ) 10 MG tablet TAKE 1 TABLET BY MOUTH TWICE DAILY 60 tablet 11   haloperidol  decanoate (HALDOL  DECANOATE) 100 MG/ML injection Inject 1 mL (100 mg total) into the muscle every 30 (thirty) days. 1 mL 11   hydrOXYzine  (ATARAX ) 25 MG tablet TAKE 1 TABLET BY MOUTH 3 TIMES DAILY AS NEEDED FOR ANXIETY 75 tablet 11   lisinopril  (ZESTRIL ) 20 MG tablet Take 1 tablet (20 mg total) by mouth daily. 30 tablet 3   nicotine  (NICODERM CQ  - DOSED IN MG/24 HOURS) 14 mg/24hr patch APPLY 1 PATCH TO SKIN DAILY *NEW PRESCRIPTION REQUEST* 28 patch 1   traZODone  (DESYREL ) 300 MG tablet Take 1 tablet (300 mg total) by mouth at bedtime as needed. for sleep 30 tablet 2   Current Facility-Administered Medications  Medication Dose Route Frequency Provider Last Rate Last Admin   haloperidol  decanoate (HALDOL  DECANOATE) 100 MG/ML injection 100 mg  100 mg Intramuscular Q30 days Harl Regan E, NP   100 mg at 05/15/24 [id number]     Musculoskeletal: Strength & Muscle Tone: within normal limits Gait & Station: normal Patient leans: N/A  Psychiatric Specialty Exam: Review of Systems  Psychiatric/Behavioral:  Positive for dysphoric mood and sleep disturbance. Negative for decreased concentration, hallucinations, self-injury and suicidal ideas. The patient is nervous/anxious. The patient is not hyperactive.     There were no vitals taken for this visit.There is no height or weight on file to calculate BMI.  General Appearance: Casual  Eye Contact:  Good  Speech:  Clear and Coherent and Normal Rate  Volume:  Normal  Mood:  Anxious and Depressed  Affect:  Appropriate  Thought Process:  Coherent, Goal Directed,  and Descriptions of Associations: Intact  Orientation:  Full (Time, Place, and Person)  Thought Content: WDL   Suicidal Thoughts:  No  Homicidal Thoughts:  No  Memory:  Immediate;   Good Recent;   Good Remote;   Good  Judgement:  Good  Insight:  Fair  Psychomotor Activity:  Normal  Concentration:  Concentration: Good and Attention Span: Good  Recall:  Good  Fund of Knowledge: Good  Language: Good  Akathisia:  No  Handed:  Right  AIMS (if indicated): done; 0  Assets:  Communication Skills Desire for Improvement Social Support  ADL's:  Intact  Cognition: WNL  Sleep:  Fair   Screenings: AIMS    Flowsheet Row Video Visit from 05/30/2024 in [facility] Video Visit from 03/05/2024 in [facility]) [facility]  AIMS Total Score 0 0 0 0   AUDIT    Flowsheet Row [facility] ([facility]  Alcohol Use Disorder Identification Test Final Score (AUDIT) 0 2   GAD-7    Flowsheet Row Video Visit from 05/30/2024 in [facility] Video Visit from 03/05/2024 in [facility] Video Visit from 11/14/2023 in [facility] Office Visit from 09/19/2023 in [facility] [place] Video Visit from 09/18/2023 in [facility]  Total GAD-7 Score 14 12 16 15 15    PHQ2-9    Flowsheet Row Video Visit from 05/30/2024 in [facility] Video Visit from 03/05/2024 in [facility] Video Visit from 11/14/2023 in [facility] Office Visit from 09/19/2023 in [facility] [place] Video Visit from 09/18/2023 in [facility]  PHQ-2 Total Score 3 2 2 2 2   PHQ-9 Total Score 10 14 10 15 12    Flowsheet Row Video Visit from 05/30/2024 in [facility] Video Visit from 03/05/2024 in [facility] Video Visit from 11/14/2023 in [facility]  C-SSRS RISK CATEGORY No Risk No Risk Low Risk     Assessment and Plan:   Steven Weatherall. is a 43 year old, African-American male with a past psychiatric history significant for schizoaffective disorder (bipolar type), anxiety, and insomnia who presents to [facility] via virtual video visit for follow-up and medication management.  Patient presents to the encounter stating that he continues to take his medications regularly and denies experiencing any adverse side effects.  An aims assessment was performed with the patient scoring a 0.  Patient reports that he has been struggling with his sleep as of late.  He reports that his use of trazodone  has been minimally effective in managing his sleep, even when he tries doubling up on the medication.  He reports that he receives roughly 5 to 6 hours of sleep per night, but often finds himself staying awake until 2 to 3 AM.  Provider recommended adjusting patient's trazodone  dosage from 100 mg to 300 mg at bedtime for the management of his sleep.  Patient was agreeable to recommendation.  Patient denies overt depressive symptoms nor does he endorse anxiety.  A PHQ 2 screen was performed with the patient scoring a 0.  A GAD-7 screen was also performed with the patient scoring a 0.  Despite issues with his sleep, patient appears stable on his other medications and would like to continue taking his other medications  as prescribed.  Patient's  medications to be e-prescribed to pharmacy of choice.  Patient to be scheduled for an EKG in the future.  Patient's most recent EKG was performed on 11/21/2022.  - Qtc: 420 ms  A [place] Suicide Severity Rating Scale was performed with the patient being considered no risk.  Patient denies suicidal ideations and is able to contract for safety at this time.    Collaboration of Care: Collaboration of Care: Medication Management AEB provider managing patient's psychiatric medications, Primary Care Provider AEB patient was provided resources for primary care providers, and Psychiatrist AEB patient is being followed by mental health provider at this facility  Patient/Guardian was advised Release of Information must be obtained prior to any record release in order to collaborate their care with an outside provider. Patient/Guardian was advised if they have not already done so to contact the registration department to sign all necessary forms in order for us  to release information regarding their care.   Consent: Patient/Guardian gives verbal consent for treatment and assignment of benefits for services provided during this visit. Patient/Guardian expressed understanding and agreed to proceed.   1. Insomnia due to other mental disorder  - traZODone  (DESYREL ) 300 MG tablet; Take 1 tablet (300 mg total) by mouth at bedtime as needed. for sleep  Dispense: 30 tablet; Refill: 2  2. Essential hypertension Patient to continue taking clonidine  0.1 mg daily for the management of his essential hypertension  3. Schizoaffective disorder, bipolar type (HCC) Patient to continue receiving haloperidol  decanoate 100 mg/mL injection every 30 days for the management of his schizoaffective disorder Patient to continue taking Depakote  500 mg daily for the management of his schizoaffective disorder (bipolar type) Patient to continue taking haloperidol  10 mg 2 times daily for the management of his schizoaffective disorder  (bipolar type) Patient to continue taking benztropine  1 mg 2 times daily  4. Anxiety state Patient to continue taking hydroxyzine  25 mg 3 times daily as needed  Patient to follow up with [facility] clinic on 06/17/2024 for his long-acting injectable to be administered Patient to follow up in 2 months Provider spent a total of 23 minutes with the patient/reviewing patient's chart  Reginia FORBES Bolster, PA 05/30/2024, 10:30 AM

## 2024-06-04 ENCOUNTER — Telehealth (HOSPITAL_COMMUNITY): Admitting: Physician Assistant

## 2024-06-17 ENCOUNTER — Encounter (HOSPITAL_COMMUNITY): Payer: Self-pay

## 2024-06-17 ENCOUNTER — Ambulatory Visit (INDEPENDENT_AMBULATORY_CARE_PROVIDER_SITE_OTHER)

## 2024-06-17 VITALS — BP 156/113 | HR 95 | Ht 69.0 in | Wt 301.8 lb

## 2024-06-17 DIAGNOSIS — F200 Paranoid schizophrenia: Secondary | ICD-10-CM | POA: Diagnosis not present

## 2024-06-17 DIAGNOSIS — F411 Generalized anxiety disorder: Secondary | ICD-10-CM

## 2024-06-17 NOTE — Progress Notes (Cosign Needed)
 Patient presented to office for injection of Haldol  100 mg. Patient denies SI/HI/AVH. Patient received injection in right deltoid. Patient tolerated injection well. Patient left alert and ambulatory. Patient will return in 30 days.

## 2024-07-16 ENCOUNTER — Ambulatory Visit (INDEPENDENT_AMBULATORY_CARE_PROVIDER_SITE_OTHER)

## 2024-07-16 ENCOUNTER — Encounter (HOSPITAL_COMMUNITY): Payer: Self-pay

## 2024-07-16 VITALS — BP 139/108 | HR 103 | Ht 69.0 in | Wt 303.4 lb

## 2024-07-16 DIAGNOSIS — F200 Paranoid schizophrenia: Secondary | ICD-10-CM

## 2024-07-16 NOTE — Progress Notes (Cosign Needed)
 Patient presented to office for injection of Haldol  100 mg. Patient denies SI/HI/AVH Patient continues to have hypertension. Patient reports taking his medications daily. The importance of heathy habits were discussed with patient. Pt expressed understanding.  Patient received injection in left deltoid. Patient tolerated injection well. Patient left alert and ambulatory. Patient will return in 30 days.

## 2024-07-22 ENCOUNTER — Ambulatory Visit: Payer: 59 | Attending: Nurse Practitioner

## 2024-07-22 VITALS — Ht 69.0 in | Wt 300.0 lb

## 2024-07-22 DIAGNOSIS — Z0000 Encounter for general adult medical examination without abnormal findings: Secondary | ICD-10-CM | POA: Diagnosis not present

## 2024-07-22 NOTE — Progress Notes (Signed)
 I connected with  Steven Clayton. on 07/22/24 by a audio enabled telemedicine application and verified that I am speaking with the correct person using two identifiers.  Patient Location: Home  Provider Location: Home Office  Persons Participating in Visit: Patient.  I discussed the limitations of evaluation and management by telemedicine. The patient expressed understanding and agreed to proceed.   Vital Signs: Because this visit was a virtual/telehealth visit, some criteria may be missing or patient reported. Any vitals not documented were not able to be obtained and vitals that have been documented are patient reported.     Chief Complaint  Patient presents with   Medicare Wellness    SUBSEQUENT     Subjective:   Steven Clayton. is a 43 y.o. male who presents for a Medicare Annual Wellness Visit.  Visit info / Clinical Intake: Medicare Wellness Visit Type:: Subsequent Annual Wellness Visit Persons participating in visit and providing information:: patient Medicare Wellness Visit Mode:: Telephone If telephone:: video declined Since this visit was completed virtually, some vitals may be partially provided or unavailable. Missing vitals are due to the limitations of the virtual format.: Documented vitals are patient reported If Telephone or Video please confirm:: I connected with patient using audio/video enable telemedicine. I verified patient identity with two identifiers, discussed telehealth limitations, and patient agreed to proceed. Patient Location:: HOME Provider Location:: HOME OFFICE Interpreter Needed?: No Pre-visit prep was completed: yes AWV questionnaire completed by patient prior to visit?: no Living arrangements:: with family/others Patient's Overall Health Status Rating: (!) fair Typical amount of pain: none Does pain affect daily life?: no Are you currently prescribed opioids?: no  Dietary Habits and Nutritional Risks How many meals a day?: 3  (STAYS HYDRATED) Eats fruit and vegetables daily?: yes Most meals are obtained by: preparing own meals In the last 2 weeks, have you had any of the following?: none Diabetic:: no  Functional Status Activities of Daily Living (to include ambulation/medication): Independent Ambulation: Independent Medication Administration: Independent Home Management (perform basic housework or laundry): Independent Manage your own finances?: (!) no (PAYEE: MOTHER) Primary transportation is: facility / other [facility] TRANSPORTATION) Concerns about vision?: no *vision screening is required for WTM* Concerns about hearing?: no  Fall Screening Falls in the past year?: 0 Number of falls in past year: 0 Was there an injury with Fall?: 0 Fall Risk Category Calculator: 0 Patient Fall Risk Level: Low Fall Risk  Fall Risk Patient at Risk for Falls Due to: No Fall Risks Fall risk Follow up: Falls evaluation completed; Education provided  Home and Transportation Safety: All rugs have non-skid backing?: (!) no All stairs or steps have railings?: (!) no Grab bars in the bathtub or shower?: yes Have non-skid surface in bathtub or shower?: yes Good home lighting?: yes Regular seat belt use?: yes Hospital stays in the last year:: no  Cognitive Assessment Difficulty concentrating, remembering, or making decisions? : no Will 6CIT or Mini Cog be Completed: no 6CIT or Mini Cog Declined: patient alert, oriented, able to answer questions appropriately and recall recent events  Advance Directives (For Healthcare) Does Patient Have a Medical Advance Directive?: Yes Does patient want to make changes to medical advance directive?: No - Patient declined Type of Advance Directive: Healthcare Power of Attorney Copy of Healthcare Power of Attorney in Chart?: No - copy requested  Reviewed/Updated  Reviewed/Updated: Reviewed All (Medical, Surgical, Family, Medications, Allergies, Care Teams, Patient  Goals)    Allergies (verified) Patient has no known allergies.  Current Medications (verified) Outpatient Encounter Medications as of 07/22/2024  Medication Sig   atorvastatin  (LIPITOR) 10 MG tablet Take 1 tablet (10 mg total) by mouth daily.   benztropine  (COGENTIN ) 1 MG tablet TAKE 1 TABLET BY MOUTH TWICE DAILY   cloNIDine  (CATAPRES ) 0.1 MG tablet Take 1 tablet (0.1 mg total) by mouth daily.   divalproex  (DEPAKOTE  ER) 500 MG 24 hr tablet TAKE 1 TABLET BY MOUTH DAILY   haloperidol  (HALDOL ) 10 MG tablet TAKE 1 TABLET BY MOUTH TWICE DAILY   haloperidol  decanoate (HALDOL  DECANOATE) 100 MG/ML injection Inject 1 mL (100 mg total) into the muscle every 30 (thirty) days.   hydrOXYzine  (ATARAX ) 25 MG tablet TAKE 1 TABLET BY MOUTH 3 TIMES DAILY AS NEEDED FOR ANXIETY   lisinopril  (ZESTRIL ) 20 MG tablet Take 1 tablet (20 mg total) by mouth daily.   nicotine  (NICODERM CQ  - DOSED IN MG/24 HOURS) 14 mg/24hr patch APPLY 1 PATCH TO SKIN DAILY *NEW PRESCRIPTION REQUEST*   traZODone  (DESYREL ) 300 MG tablet Take 1 tablet (300 mg total) by mouth at bedtime as needed. for sleep   Facility-Administered Encounter Medications as of 07/22/2024  Medication   haloperidol  decanoate (HALDOL  DECANOATE) 100 MG/ML injection 100 mg    History: Past Medical History:  Diagnosis Date   Anxiety    Depression    Schizoaffective disorder (HCC)    Stroke [facility])    History reviewed. No pertinent surgical history. Family History  Problem Relation Age of Onset   Heart disease Mother    Hyperlipidemia Mother    Mental illness Father    Social History   Occupational History   Not on file  Tobacco Use   Smoking status: Some Days    Current packs/day: 2.00    Average packs/day: 2.0 packs/day for 11.9 years (23.9 ttl pk-yrs)    Types: Cigarettes    Start date: 08/15/2012   Smokeless tobacco: Never  Vaping Use   Vaping status: Never Used  Substance and Sexual Activity   Alcohol use: Not Currently     Alcohol/week: 1.0 standard drink of alcohol    Types: 1 Glasses of wine per week   Drug use: No   Sexual activity: Never   Tobacco Counseling Ready to quit: Not Answered Counseling given: Not Answered  SDOH Screenings   Food Insecurity: No Food Insecurity (07/22/2024)  Housing: Low Risk  (07/22/2024)  Transportation Needs: No Transportation Needs (07/22/2024)  Utilities: Not At Risk (07/22/2024)  Alcohol Screen: Low Risk  (07/22/2024)  Depression (PHQ2-9): Low Risk  (07/22/2024)  Recent Concern: Depression (PHQ2-9) - Medium Risk (05/30/2024)  Financial Resource Strain: Low Risk  (07/22/2024)  Physical Activity: Sufficiently Active (07/22/2024)  Social Connections: Moderately Isolated (07/22/2024)  Stress: No Stress Concern Present (07/22/2024)  Tobacco Use: High Risk (07/22/2024)  Health Literacy: Adequate Health Literacy (07/22/2024)   See flowsheets for full screening details  Depression Screen PHQ 2 & 9 Depression Scale- Over the past 2 weeks, how often have you been bothered by any of the following problems? Little interest or pleasure in doing things: 0 Feeling down, depressed, or hopeless (PHQ Adolescent also includes...irritable): 1 PHQ-2 Total Score: 1 Trouble falling or staying asleep, or sleeping too much: 3 Feeling tired or having little energy: 0 Poor appetite or overeating (PHQ Adolescent also includes...weight loss): 0 Feeling bad about yourself - or that you are a failure or have let yourself or your family down: 0 Trouble concentrating on things, such as reading the newspaper or watching television (PHQ  Adolescent also includes...like school work): 0 Moving or speaking so slowly that other people could have noticed. Or the opposite - being so fidgety or restless that you have been moving around a lot more than usual: 0 Thoughts that you would be better off dead, or of hurting yourself in some way: 0 PHQ-9 Total Score: 4 If you checked off any problems, how difficult have  these problems made it for you to do your work, take care of things at home, or get along with other people?: Not difficult at all  Depression Treatment Depression Interventions/Treatment : EYV7-0 Score <4 Follow-up Not Indicated; Medication; Currently on Treatment; Counseling     Goals Addressed             This Visit's Progress    07/22/2024: My goal for 2026 is to continue to do my best with my health by making sure I keep up with my follow up appointments.               Objective:    Today's Vitals   07/22/24 1336  Weight: 300 lb (136.1 kg)  Height: 5' 9 (1.753 m)  PainSc: 0-No pain   Body mass index is 44.3 kg/m.  Hearing/Vision screen Hearing Screening - Comments:: Adequate hearing, no hearing aids. Vision Screening - Comments:: Adequate vision, no eyeglasses needed. Immunizations and Health Maintenance Health Maintenance  Topic Date Due   Hepatitis C Screening  Never done   Hepatitis B Vaccines 19-59 Average Risk (1 of 3 - 19+ 3-dose series) Never done   HPV VACCINES (1 - 3-dose SCDM series) Never done   COVID-19 Vaccine (3 - Moderna risk series) 04/20/2020   Influenza Vaccine  03/21/2024   Medicare Annual Wellness (AWV)  07/22/2025   DTaP/Tdap/Td (2 - Td or Tdap) 06/04/2033   Pneumococcal Vaccine  Completed   HIV Screening  Completed   Meningococcal B Vaccine  Aged Out        Assessment/Plan:  This is a routine wellness examination for [facility].  Patient Care Team: Theotis Haze ORN, NP as PCP - General (Nurse Practitioner)  I have personally reviewed and noted the following in the patient's chart:   Medical and social history Use of alcohol, tobacco or illicit drugs  Current medications and supplements including opioid prescriptions. Functional ability and status Nutritional status Physical activity Advanced directives List of other physicians Hospitalizations, surgeries, and ER visits in previous 12 months Vitals Screenings to include  cognitive, depression, and falls Referrals and appointments  No orders of the defined types were placed in this encounter.  In addition, I have reviewed and discussed with patient certain preventive protocols, quality metrics, and best practice recommendations. A written personalized care plan for preventive services as well as general preventive health recommendations were provided to patient.   Roz LOISE Fuller, LPN   [id number]   Return in about 1 year (around 07/22/2025) for Medicare wellness.  After Visit Summary: (Mail) Due to this being a telephonic visit, the after visit summary with patients personalized plan was offered to patient via mail   Nurse Notes: Please see routing comments.

## 2024-07-22 NOTE — Patient Instructions (Signed)
 Mr. Steven Clayton,  Thank you for taking the time for your Medicare Wellness Visit. I appreciate your continued commitment to your health goals. Please review the care plan we discussed, and feel free to reach out if I can assist you further.  Please note that Annual Wellness Visits do not include a physical exam. Some assessments may be limited, especially if the visit was conducted virtually. If needed, we may recommend an in-person follow-up with your provider.  Ongoing Care Seeing your primary care provider every 3 to 6 months helps us  monitor your health and provide consistent, personalized care.   Referrals If a referral was made during today's visit and you haven't received any updates within two weeks, please contact the referred provider directly to check on the status.  Recommended Screenings:  Health Maintenance  Topic Date Due   Hepatitis C Screening  Never done   Hepatitis B Vaccine (1 of 3 - 19+ 3-dose series) Never done   HPV Vaccine (1 - 3-dose SCDM series) Never done   COVID-19 Vaccine (3 - Moderna risk series) 04/20/2020   Flu Shot  03/21/2024   Medicare Annual Wellness Visit  07/16/2024   DTaP/Tdap/Td vaccine (2 - Td or Tdap) 06/04/2033   Pneumococcal Vaccine  Completed   HIV Screening  Completed   Meningitis B Vaccine  Aged Out       07/22/2024    1:38 PM  Advanced Directives  Does Patient Have a Medical Advance Directive? Yes  Type of Advance Directive Healthcare Power of Attorney  Does patient want to make changes to medical advance directive? No - Patient declined  Copy of Healthcare Power of Attorney in Chart? No - copy requested    Vision: Annual vision screenings are recommended for early detection of glaucoma, cataracts, and diabetic retinopathy. These exams can also reveal signs of chronic conditions such as diabetes and high blood pressure.  Dental: Annual dental screenings help detect early signs of oral cancer, gum disease, and other conditions linked  to overall health, including heart disease and diabetes.  Please see the attached documents for additional preventive care recommendations.

## 2024-08-01 ENCOUNTER — Telehealth (HOSPITAL_COMMUNITY): Admitting: Physician Assistant

## 2024-08-01 ENCOUNTER — Encounter (HOSPITAL_COMMUNITY): Payer: Self-pay

## 2024-08-01 DIAGNOSIS — F5105 Insomnia due to other mental disorder: Secondary | ICD-10-CM | POA: Diagnosis not present

## 2024-08-01 DIAGNOSIS — F411 Generalized anxiety disorder: Secondary | ICD-10-CM | POA: Diagnosis not present

## 2024-08-01 DIAGNOSIS — F250 Schizoaffective disorder, bipolar type: Secondary | ICD-10-CM

## 2024-08-01 DIAGNOSIS — I10 Essential (primary) hypertension: Secondary | ICD-10-CM | POA: Diagnosis not present

## 2024-08-16 ENCOUNTER — Encounter (HOSPITAL_COMMUNITY): Payer: Self-pay | Admitting: Physician Assistant

## 2024-08-16 NOTE — Progress Notes (Signed)
 BH MD/PA/NP OP Progress Note  Virtual Visit via Video Note  I connected with Steven Trudy Raddle. on 08/01/24 at 10:30 AM EST by a video enabled telemedicine application and verified that I am speaking with the correct person using two identifiers.  Location: Patient: Home Provider: Clinic   I discussed the limitations of evaluation and management by telemedicine and the availability of in person appointments. The patient expressed understanding and agreed to proceed.  Follow Up Instructions:   I discussed the assessment and treatment plan with the patient. The patient was provided an opportunity to ask questions and all were answered. The patient agreed with the plan and demonstrated an understanding of the instructions.   The patient was advised to call back or seek an in-person evaluation if the symptoms worsen or if the condition fails to improve as anticipated.  I provided 13 minutes of non-face-to-face time during this encounter.  Burna Atlas E Isai Gottlieb, PA    08/01/2024 10:30 AM Steven Trudy Raddle.  MRN:  [id number]  Chief Complaint:  Chief Complaint  Patient presents with   Follow-up   HPI:   Steven Doggett. is a 43 year old, African-American male with a past psychiatric history significant for schizoaffective disorder (bipolar type), anxiety, and insomnia who presents to [facility] via virtual video visit for follow-up and medication management.  Patient is currently being managed on the following psychiatric medications:  Benztropine  1 mg 2 times daily Divalproex  500 mg 24-hour tablet daily Haloperidol  10 mg 2 times daily Haloperidol  decanoate 100 mg/mL injection (first dose given on 11/18/2022) Hydroxyzine  25 mg 3 times daily as needed Trazodone  100 mg at bedtime Clonidine  0.1 mg daily  Patient presents to the encounter stating that he has been taking his medications regularly and denies experiencing any adverse side effects at  this time.  Patient denies overt depressive symptoms nor does he endorse anxiety.  He denies changes in his behavior nor does he endorse paranoia.  He further denies auditory or visual hallucinations.  Patient denies any new stressors at this time.  A PHQ-9 screen was performed with the patient scoring a 10.  A GAD-7 screen was also performed with the patient scoring a 10.  Patient is alert and oriented x 4, calm, cooperative, and fully engaged in conversation during the encounter.  Patient endorses okay mood.  Patient exhibits euthymic mood with appropriate affect.  Patient denies suicidal or homicidal ideations.  He further denies auditory or visual hallucinations and does not appear to be responding to internal/external stimuli.  Patient endorses fair sleep and receives on average 6 to 7 hours of sleep per night.  Patient endorses good appetite and eats on average 3 meals per day.  Patient denies alcohol consumption or illicit drug use.  Patient endorses tobacco use and smokes on average 1 to 2 cigarettes/day.  Visit Diagnosis:    ICD-10-CM   1. Insomnia due to other mental disorder  F51.05    F99     2. Essential hypertension  I10     3. Schizoaffective disorder, bipolar type (HCC)  F25.0     4. Anxiety state  F41.1        Past Psychiatric History:  Patient has a past psychiatric history significant for the following: Schizoaffective disorder (bipolar type), anxiety, and insomnia  Patient has a past history of hospitalization due to mental health and was recently hospitalized on 11/07/2022.   Past Medical History:  Past Medical History:  Diagnosis Date  Anxiety    Depression    Schizoaffective disorder (HCC)    Stroke [facility])    History reviewed. No pertinent surgical history.  Family Psychiatric History:  Patient denies any specific family history of psychiatric  illness. He does report that his father suffered traumatic brain injury while being in the [employer].   Mother -  major depressive disorder  Family History:  Family History  Problem Relation Age of Onset   Heart disease Mother    Hyperlipidemia Mother    Mental illness Father     Social History:  Social History   Socioeconomic History   Marital status: Single    Spouse name: Not on file   Number of children: Not on file   Years of education: Not on file   Highest education level: Not on file  Occupational History   Not on file  Tobacco Use   Smoking status: Some Days    Current packs/day: 2.00    Average packs/day: 2.0 packs/day for 12.0 years (24.0 ttl pk-yrs)    Types: Cigarettes    Start date: 08/15/2012   Smokeless tobacco: Never  Vaping Use   Vaping status: Never Used  Substance and Sexual Activity   Alcohol use: Not Currently    Alcohol/week: 1.0 standard drink of alcohol    Types: 1 Glasses of wine per week   Drug use: No   Sexual activity: Never  Other Topics Concern   Not on file  Social History Narrative   Not on file   Social Drivers of Health   Tobacco Use: High Risk (08/16/2024)   Patient History    Smoking Tobacco Use: Some Days    Smokeless Tobacco Use: Never    Passive Exposure: Not on file  Financial Resource Strain: Low Risk (07/22/2024)   Overall Financial Resource Strain (CARDIA)    Difficulty of Paying Living Expenses: Not hard at all  Food Insecurity: No Food Insecurity (07/22/2024)   Epic    Worried About Programme Researcher, Broadcasting/film/video in the Last Year: Never true    Ran Out of Food in the Last Year: Never true  Transportation Needs: No Transportation Needs (07/22/2024)   Epic    Lack of Transportation (Medical): No    Lack of Transportation (Non-Medical): No  Physical Activity: Sufficiently Active (07/22/2024)   Exercise Vital Sign    Days of Exercise per Week: 5 days    Minutes of Exercise per Session: 30 min  Stress: No Stress Concern Present (07/22/2024)   [name] of Occupational Health - Occupational Stress Questionnaire    Feeling of Stress:  Not at all  Social Connections: Moderately Isolated (07/22/2024)   Social Connection and Isolation Panel    Frequency of Communication with Friends and Family: More than three times a week    Frequency of Social Gatherings with Friends and Family: More than three times a week    Attends Religious Services: Never    Database Administrator or Organizations: Yes    Attends Banker Meetings: More than 4 times per year    Marital Status: Never married  Depression (PHQ2-9): Medium Risk (08/01/2024)   Depression (PHQ2-9)    PHQ-2 Score: 10  Alcohol Screen: Low Risk (07/22/2024)   Alcohol Screen    Last Alcohol Screening Score (AUDIT): 0  Housing: Low Risk (07/22/2024)   Epic    Unable to Pay for Housing in the Last Year: No    Number of Times Moved in the Last Year:  0    Homeless in the Last Year: No  Utilities: Not At Risk (07/22/2024)   Epic    Threatened with loss of utilities: No  Health Literacy: Adequate Health Literacy (07/22/2024)   B1300 Health Literacy    Frequency of need for help with medical instructions: Never    Allergies: No Known Allergies  Metabolic Disorder Labs: Lab Results  Component Value Date   HGBA1C 5.9 (H) 09/19/2023   MPG 111 11/07/2022   MPG 111 03/17/2021   Lab Results  Component Value Date   PROLACTIN 33.3 (H) 03/17/2021   PROLACTIN 26.5 (H) 01/20/2015   Lab Results  Component Value Date   CHOL 240 (H) 09/19/2023   TRIG 175 (H) 09/19/2023   HDL 44 09/19/2023   CHOLHDL 5.5 (H) 09/19/2023   VLDL 29 11/07/2022   LDLCALC 164 (H) 09/19/2023   LDLCALC 130 (H) 11/07/2022   Lab Results  Component Value Date   TSH 1.796 11/07/2022   TSH 1.898 03/17/2021    Therapeutic Level Labs: No results found for: LITHIUM Lab Results  Component Value Date   VALPROATE 57 09/19/2023   VALPROATE 46 (L) 11/20/2022   No results found for: CBMZ  Current Medications: Current Outpatient Medications  Medication Sig Dispense Refill    atorvastatin  (LIPITOR) 10 MG tablet Take 1 tablet (10 mg total) by mouth daily. 90 tablet 3   benztropine  (COGENTIN ) 1 MG tablet TAKE 1 TABLET BY MOUTH TWICE DAILY 180 tablet 11   cloNIDine  (CATAPRES ) 0.1 MG tablet Take 1 tablet (0.1 mg total) by mouth daily. 90 tablet 11   divalproex  (DEPAKOTE  ER) 500 MG 24 hr tablet TAKE 1 TABLET BY MOUTH DAILY 90 tablet 11   haloperidol  (HALDOL ) 10 MG tablet TAKE 1 TABLET BY MOUTH TWICE DAILY 60 tablet 11   haloperidol  decanoate (HALDOL  DECANOATE) 100 MG/ML injection Inject 1 mL (100 mg total) into the muscle every 30 (thirty) days. 1 mL 11   hydrOXYzine  (ATARAX ) 25 MG tablet TAKE 1 TABLET BY MOUTH 3 TIMES DAILY AS NEEDED FOR ANXIETY 75 tablet 11   lisinopril  (ZESTRIL ) 20 MG tablet Take 1 tablet (20 mg total) by mouth daily. 30 tablet 3   nicotine  (NICODERM CQ  - DOSED IN MG/24 HOURS) 14 mg/24hr patch APPLY 1 PATCH TO SKIN DAILY *NEW PRESCRIPTION REQUEST* 28 patch 1   traZODone  (DESYREL ) 300 MG tablet Take 1 tablet (300 mg total) by mouth at bedtime as needed. for sleep 30 tablet 2   Current Facility-Administered Medications  Medication Dose Route Frequency Provider Last Rate Last Admin   haloperidol  decanoate (HALDOL  DECANOATE) 100 MG/ML injection 100 mg  100 mg Intramuscular Q30 days Harl Regan E, NP   100 mg at 07/16/24 [id number]     Musculoskeletal: Strength & Muscle Tone: within normal limits Gait & Station: normal Patient leans: N/A  Psychiatric Specialty Exam: Review of Systems  Psychiatric/Behavioral:  Positive for sleep disturbance. Negative for decreased concentration, dysphoric mood, hallucinations, self-injury and suicidal ideas. The patient is not nervous/anxious and is not hyperactive.     There were no vitals taken for this visit.There is no height or weight on file to calculate BMI.  General Appearance: Casual  Eye Contact:  Good  Speech:  Clear and Coherent and Normal Rate  Volume:  Normal  Mood:  Euthymic  Affect:  Appropriate   Thought Process:  Coherent, Goal Directed, and Descriptions of Associations: Intact  Orientation:  Full (Time, Place, and Person)  Thought Content: WDL   Suicidal  Thoughts:  No  Homicidal Thoughts:  No  Memory:  Immediate;   Good Recent;   Good Remote;   Good  Judgement:  Good  Insight:  Fair  Psychomotor Activity:  Normal  Concentration:  Concentration: Good and Attention Span: Good  Recall:  Good  Fund of Knowledge: Good  Language: Good  Akathisia:  No  Handed:  Right  AIMS (if indicated): done; 0  Assets:  Communication Skills Desire for Improvement Social Support  ADL's:  Intact  Cognition: WNL  Sleep:  Fair   Screenings: AIMS    Flowsheet Row Video Visit from 08/01/2024 in [facility] Video Visit from 05/30/2024 in [facility] Video Visit from 03/05/2024 in [facility]) [facility]  AIMS Total Score 0 0 0 0 0   AUDIT    Flowsheet Row [facility]) [facility] ([facility]  Alcohol Use Disorder Identification Test Final Score (AUDIT) 0 2   GAD-7    Flowsheet Row Video Visit from 08/01/2024 in [facility] Video Visit from 05/30/2024 in [facility] Video Visit from 03/05/2024 in [facility] Video Visit from 11/14/2023 in [facility] Office Visit from 09/19/2023 in [facility] [place]  Total GAD-7 Score 10 14 12 16 15    PHQ2-9    Flowsheet Row Video Visit from 08/01/2024 in [facility] Clinical Support  from 07/22/2024 in [facility] [place] Video Visit from 05/30/2024 in [facility] Video Visit from 03/05/2024 in [facility] Video Visit from 11/14/2023 in [facility]  PHQ-2 Total Score 2 1 3 2 2   PHQ-9 Total Score 10 4 10 14 10    Flowsheet Row Video Visit from 08/01/2024 in [facility] Video Visit from 05/30/2024 in [facility] Video Visit from 03/05/2024 in [facility]  C-SSRS RISK CATEGORY No Risk No Risk No Risk     Assessment and Plan:   Steven Mudrick. is a 43 year old, African-American male with a past psychiatric history significant for schizoaffective disorder (bipolar type), anxiety, and insomnia who presents to [facility] via virtual video visit for follow-up and medication management.  Patient presents to the encounter stating that he has been taking his medications regularly and denies experiencing any adverse side effects.  An aims assessment was performed with the patient scoring a 0.  Patient denies overt depressive symptoms nor does he endorse anxiety.  A PHQ-9 screen was performed with the patient scoring a 10.  A GAD-7 screen was also performed with the patient scoring a 10.  Patient denies changes in his behavior nor does he endorse paranoia.  Patient does not appear to be responding to internal/external stimuli.  Patient denies the need for dosage adjustments at this time and would like to continue taking his medications as prescribed.  Patient also denies the need for refills at this time.  Provider will continue to monitor patient during subsequent encounters.  A [place] Suicide Severity Rating Scale  was performed with the patient being considered no risk.  Patient denies suicidal ideations and is able to  contract for safety at this time.    Collaboration of Care: Collaboration of Care: Medication Management AEB provider managing patient's psychiatric medications, Primary Care Provider AEB patient was provided resources for primary care providers, and Psychiatrist AEB patient is being followed by mental health provider at this facility  Patient/Guardian was advised Release of Information must be obtained prior to any record release in order to collaborate their care with an outside provider. Patient/Guardian was advised if they have not already done so to contact the registration department to sign all necessary forms in order for us  to release information regarding their care.   Consent: Patient/Guardian gives verbal consent for treatment and assignment of benefits for services provided during this visit. Patient/Guardian expressed understanding and agreed to proceed.   1. Insomnia due to other mental disorder (Primary) Patient to continue taking trazodone  300 mg at bedtime as needed for the management of his sleep  2. Essential hypertension Patient to continue taking clonidine  0.1 mg daily for the management of his essential hypertension  3. Schizoaffective disorder, bipolar type (HCC) Patient to continue receiving haloperidol  decanoate 100 mg/mL injection every 30 days for the management of his schizoaffective disorder Patient to continue taking Depakote  500 mg daily for the management of his schizoaffective disorder (bipolar type) Patient to continue taking haloperidol  10 mg 2 times daily for the management of his schizoaffective disorder (bipolar type) Patient to continue taking benztropine  1 mg 2 times daily  4. Anxiety state Patient to continue taking hydroxyzine  25 mg 3 times daily as needed  Patient to follow up with [facility] clinic on 08/19/2024 for his long-acting injectable to be administered Patient to follow up in 2 months Provider spent a total of 13 minutes with  the patient/reviewing patient's chart  Reginia FORBES Bolster, PA 08/01/2024, 10:30 AM

## 2024-08-19 ENCOUNTER — Ambulatory Visit (HOSPITAL_COMMUNITY)

## 2024-08-27 ENCOUNTER — Ambulatory Visit (INDEPENDENT_AMBULATORY_CARE_PROVIDER_SITE_OTHER)

## 2024-08-27 VITALS — BP 140/110 | HR 101 | Ht 69.0 in | Wt 304.8 lb

## 2024-08-27 DIAGNOSIS — F200 Paranoid schizophrenia: Secondary | ICD-10-CM | POA: Diagnosis not present

## 2024-08-27 NOTE — Progress Notes (Cosign Needed)
 Patient presented to office for injection of Haldol  100 mg. Pateint blood pressure was assessed 3 times. Patient continues to present to office with hypertension despite bing on antihypertensives.  Prior to injection T. Lewis, NP spoke with patient about hypertension management, recommended establishing care with a PC and discussed risks of receiving injection while experiencing hypertensive crisis.    Patient denies SI/HI/AVH. Patient received injection in right deltoid. Patient tolerated injection well. Patient left alert and ambulatory. Patient will return in 30 days.

## 2024-08-28 ENCOUNTER — Other Ambulatory Visit: Payer: Self-pay | Admitting: Nurse Practitioner

## 2024-08-28 DIAGNOSIS — E7800 Pure hypercholesterolemia, unspecified: Secondary | ICD-10-CM

## 2024-09-18 ENCOUNTER — Other Ambulatory Visit (HOSPITAL_COMMUNITY): Payer: Self-pay | Admitting: Physician Assistant

## 2024-09-18 ENCOUNTER — Other Ambulatory Visit (HOSPITAL_COMMUNITY): Payer: Self-pay | Admitting: Psychiatry

## 2024-09-18 DIAGNOSIS — F5105 Insomnia due to other mental disorder: Secondary | ICD-10-CM

## 2024-09-18 DIAGNOSIS — I10 Essential (primary) hypertension: Secondary | ICD-10-CM

## 2024-09-25 ENCOUNTER — Telehealth (HOSPITAL_COMMUNITY): Payer: Self-pay

## 2024-09-25 NOTE — Telephone Encounter (Signed)
 Patient Pharmacy called in requesting refill of Lisinopril  20 mg, Trazodone  300 mg.

## 2024-09-25 NOTE — Telephone Encounter (Signed)
 Thank you for this update

## 2024-09-26 NOTE — Telephone Encounter (Signed)
 Patient's trazodone  was refilled.

## 2024-09-30 ENCOUNTER — Ambulatory Visit (HOSPITAL_COMMUNITY)

## 2024-10-03 ENCOUNTER — Telehealth (HOSPITAL_COMMUNITY): Admitting: Physician Assistant
